# Patient Record
Sex: Male | Born: 1980 | Race: White | Hispanic: No | Marital: Married | Smoking: Never smoker
Health system: Southern US, Community
[De-identification: ages and names within clinical notes are randomized; demographics above are authoritative.]

## PROBLEM LIST (undated history)

## (undated) DIAGNOSIS — R7989 Other specified abnormal findings of blood chemistry: Secondary | ICD-10-CM

## (undated) DIAGNOSIS — K219 Gastro-esophageal reflux disease without esophagitis: Secondary | ICD-10-CM

## (undated) DIAGNOSIS — D869 Sarcoidosis, unspecified: Secondary | ICD-10-CM

## (undated) DIAGNOSIS — K2970 Gastritis, unspecified, without bleeding: Secondary | ICD-10-CM

## (undated) DIAGNOSIS — E669 Obesity, unspecified: Secondary | ICD-10-CM

## (undated) DIAGNOSIS — E559 Vitamin D deficiency, unspecified: Secondary | ICD-10-CM

## (undated) DIAGNOSIS — R059 Cough, unspecified: Secondary | ICD-10-CM

## (undated) DIAGNOSIS — E785 Hyperlipidemia, unspecified: Secondary | ICD-10-CM

## (undated) DIAGNOSIS — R0600 Dyspnea, unspecified: Secondary | ICD-10-CM

## (undated) DIAGNOSIS — K4090 Unilateral inguinal hernia, without obstruction or gangrene, not specified as recurrent: Secondary | ICD-10-CM

## (undated) DIAGNOSIS — E039 Hypothyroidism, unspecified: Secondary | ICD-10-CM

## (undated) DIAGNOSIS — R05 Cough: Secondary | ICD-10-CM

## (undated) DIAGNOSIS — E119 Type 2 diabetes mellitus without complications: Secondary | ICD-10-CM

## (undated) HISTORY — DX: Gastritis, unspecified, without bleeding: K29.70

## (undated) HISTORY — DX: Obesity, unspecified: E66.9

## (undated) HISTORY — DX: Type 2 diabetes mellitus without complications: E11.9

## (undated) HISTORY — DX: Other specified abnormal findings of blood chemistry: R79.89

## (undated) HISTORY — PX: WISDOM TOOTH EXTRACTION: SHX21

## (undated) HISTORY — DX: Hyperlipidemia, unspecified: E78.5

## (undated) HISTORY — DX: Vitamin D deficiency, unspecified: E55.9

## (undated) HISTORY — DX: Hypothyroidism, unspecified: E03.9

## (undated) HISTORY — PX: HERNIA REPAIR: SHX51

---
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
## 2006-05-11 ENCOUNTER — Ambulatory Visit: Payer: Self-pay | Admitting: Family Medicine

## 2006-05-25 ENCOUNTER — Ambulatory Visit: Payer: Self-pay | Admitting: Family Medicine

## 2007-02-08 ENCOUNTER — Ambulatory Visit: Payer: Self-pay | Admitting: Family Medicine

## 2011-07-20 ENCOUNTER — Encounter: Payer: Self-pay | Admitting: Family Medicine

## 2011-07-20 ENCOUNTER — Ambulatory Visit (INDEPENDENT_AMBULATORY_CARE_PROVIDER_SITE_OTHER): Payer: PRIVATE HEALTH INSURANCE | Admitting: Family Medicine

## 2011-07-20 VITALS — HR 97 | Ht 68.5 in | Wt 205.0 lb

## 2011-07-20 DIAGNOSIS — Z0000 Encounter for general adult medical examination without abnormal findings: Secondary | ICD-10-CM

## 2011-07-20 LAB — LIPID PANEL
Cholesterol: 340 mg/dL — ABNORMAL HIGH (ref 0–200)
HDL: 49 mg/dL (ref >39)
LDL Cholesterol: 264 mg/dL — ABNORMAL HIGH (ref 0–99)
Total CHOL/HDL Ratio: 6.9 Ratio
Triglycerides: 135 mg/dL (ref <150)
VLDL: 27 mg/dL (ref 0–40)

## 2011-07-20 LAB — COMPREHENSIVE METABOLIC PANEL
ALT: 18 U/L (ref 0–53)
AST: 12 U/L (ref 0–37)
Albumin: 4.8 g/dL (ref 3.5–5.2)
Alkaline Phosphatase: 86 U/L (ref 39–117)
BUN: 16 mg/dL (ref 6–23)
CO2: 25 mEq/L (ref 19–32)
Calcium: 9.9 mg/dL (ref 8.4–10.5)
Chloride: 94 mEq/L — ABNORMAL LOW (ref 96–112)
Creat: 1.25 mg/dL (ref 0.50–1.35)
Glucose, Bld: 435 mg/dL — ABNORMAL HIGH (ref 70–99)
Potassium: 3.8 mEq/L (ref 3.5–5.3)
Sodium: 131 mEq/L — ABNORMAL LOW (ref 135–145)
Total Bilirubin: 0.7 mg/dL (ref 0.3–1.2)
Total Protein: 7.9 g/dL (ref 6.0–8.3)

## 2011-07-20 NOTE — Progress Notes (Signed)
  Subjective:    Patient ID: Alejandro Griffin, male    DOB: 02-27-1981, 31 y.o.   MRN: [id number]  HPI He is here for a complete examination. He has no particular concerns or complaints. He is up-to-date on his immunizations. His marriage is going well. Family history significant for a grandparent with diabetes and mother with hypertension.   Review of Systems  Constitutional: Negative.   HENT: Negative.   Eyes: Negative.   Respiratory: Negative.   Cardiovascular: Negative.   Gastrointestinal: Negative.   Genitourinary: Negative.   Musculoskeletal: Negative.   Skin: Negative.   Neurological: Negative.   Hematological: Negative.   Psychiatric/Behavioral: Negative.        Objective:   Physical Exam Pulse 97  Ht 5' 8.5" (1.74 m)  Wt 205 lb (92.987 kg)  BMI 30.72 kg/m2  SpO2 98%  General Appearance:    Alert, cooperative, no distress, appears stated age  Head:    Normocephalic, without obvious abnormality, atraumatic  Eyes:    PERRL, conjunctiva/corneas clear, EOM's intact, fundi    benign  Ears:    Normal TM's and external ear canals  Nose:   Nares normal, mucosa normal, no drainage or sinus   tenderness  Throat:   Lips, mucosa, and tongue normal; teeth and gums normal  Neck:   Supple, no lymphadenopathy;  thyroid:  no   enlargement/tenderness/nodules; no carotid   bruit or JVD  Back:    Spine nontender, no curvature, ROM normal, no CVA     tenderness  Lungs:     Clear to auscultation bilaterally without wheezes, rales or     ronchi; respirations unlabored  Chest Wall:    No tenderness or deformity   Heart:    Regular rate and rhythm, S1 and S2 normal, no murmur, rub   or gallop  Breast Exam:    No chest wall tenderness, masses or gynecomastia  Abdomen:     Soft, non-tender, nondistended, normoactive bowel sounds,    no masses, no hepatosplenomegaly  Genitalia:   deferred   Rectal:   Deferred due to age <40 and lack of symptoms  Extremities:   No clubbing, cyanosis or  edema  Pulses:   2+ and symmetric all extremities  Skin:   Skin color, texture, turgor normal, no rashes or lesions  Lymph nodes:   Cervical, supraclavicular, and axillary nodes normal  Neurologic:   CNII-XII intact, normal strength, sensation and gait; reflexes 2+ and symmetric throughout          Psych:   Normal mood, affect, hygiene and grooming.           Assessment & Plan:

## 2011-07-22 ENCOUNTER — Ambulatory Visit: Payer: PRIVATE HEALTH INSURANCE | Admitting: Family Medicine

## 2012-01-21 ENCOUNTER — Ambulatory Visit (INDEPENDENT_AMBULATORY_CARE_PROVIDER_SITE_OTHER): Payer: Self-pay | Admitting: Family Medicine

## 2012-01-21 VITALS — BP 122/80 | HR 86 | Wt 175.0 lb

## 2012-01-21 DIAGNOSIS — E119 Type 2 diabetes mellitus without complications: Secondary | ICD-10-CM

## 2012-01-21 MED ORDER — METFORMIN HCL 500 MG PO TABS: 500.0000 mg | ORAL_TABLET | Freq: Two times a day (BID) | ORAL | Status: DC

## 2012-01-21 NOTE — Patient Instructions (Addendum)
Go to the American diabetes association website or [email or web address]. Keep track of your blood sugars. Call me Monday and get me the readings.

## 2012-01-21 NOTE — Progress Notes (Signed)
  Subjective:    Patient ID: Alejandro Griffin, male    DOB: 07/20/1980, 31 y.o.   MRN: [id number]  HPI He is here for a recheck. He was last seen in April. Blood work at that time showed an elevated blood sugar. Was recommended for him to come back in however he was out of town and did not make a followup appointment. He did make some dietary changes cutting back on his sodas. Approximately 2 months ago he noted the onset of polyuria, polydipsia.   Review of Systems     Objective:   Physical Exam Alert and in no distress. Blood sugar reading was attempted but unable to accomplish. Hemoglobin A1c is 14.       Assessment & Plan:   1. Diabetes mellitus, new onset  metFORMIN (GLUCOPHAGE) 500 MG tablet   he will also be started on Levemir 10 units. He is to keep track of his blood sugars twice per day before meals. Explained that I will also start him on metformin. Cautioned him about the possibility of diarrhea. He will call if any problems. He is to call Monday with his blood sugar readings. Discussed briefly diet, exercise, medications, blood pressure and cholesterol medications. We will start the education process.

## 2012-01-24 ENCOUNTER — Telehealth: Payer: Self-pay | Admitting: Internal Medicine

## 2012-01-24 NOTE — Telephone Encounter (Signed)
Alejandro Griffin sugars numbers over the weekend were: Saturday after lunch was an error, Saturday at dinner time- was 497. Sunday morning-373, Sunday night 452. Then this morning was an error

## 2012-01-24 NOTE — Telephone Encounter (Signed)
Have him increase the insulin to 14 units and call us in a couple of days

## 2012-01-24 NOTE — Telephone Encounter (Signed)
Called and talked with Alejandro Griffin told her to up 14 units call in a couple a days

## 2012-01-26 ENCOUNTER — Other Ambulatory Visit: Payer: Self-pay | Admitting: *Deleted

## 2012-01-26 DIAGNOSIS — E119 Type 2 diabetes mellitus without complications: Secondary | ICD-10-CM

## 2012-01-26 MED ORDER — GLUCOSE BLOOD VI STRP: ORAL_STRIP | Status: DC

## 2012-01-28 ENCOUNTER — Ambulatory Visit: Payer: Self-pay | Admitting: Family Medicine

## 2012-01-31 ENCOUNTER — Ambulatory Visit (INDEPENDENT_AMBULATORY_CARE_PROVIDER_SITE_OTHER): Payer: Self-pay | Admitting: Family Medicine

## 2012-01-31 ENCOUNTER — Encounter: Payer: Self-pay | Admitting: Family Medicine

## 2012-01-31 VITALS — BP 120/80 | Wt 180.0 lb

## 2012-01-31 DIAGNOSIS — E119 Type 2 diabetes mellitus without complications: Secondary | ICD-10-CM

## 2012-01-31 NOTE — Progress Notes (Signed)
  Subjective:    Patient ID: Alejandro Griffin, male    DOB: 05-22-1980, 31 y.o.   MRN: [id number]  HPI He is here for a recheck. He is having nocturia usually only once per night. His blood sugars are running in the 2-400 range. Presently he is on 14 units of Lantus. He is taking Glucophage twice per day.   Review of Systems     Objective:   Physical Exam Alert and in no distress otherwise not examined      Assessment & Plan:   1. Diabetes mellitus, new onset    his Levemir to 18 units. He is to call me on Thursday and let me know what the number is. Discussed getting his blood sugars down in the 100 range.

## 2012-01-31 NOTE — Patient Instructions (Signed)
Go up to 18 units of Levemir per day. Keep checking your blood sugars and when the morning number are in the low 100s we will keep you at that dosing and if it doesn't come down we will go higher

## 2012-02-08 ENCOUNTER — Encounter: Payer: Self-pay | Admitting: Internal Medicine

## 2012-02-08 LAB — HM DIABETES EYE EXAM

## 2012-02-15 ENCOUNTER — Other Ambulatory Visit: Payer: Self-pay | Admitting: Family Medicine

## 2012-02-15 MED ORDER — INSULIN PEN NEEDLE 29G X 8MM MISC: Status: DC

## 2012-02-15 NOTE — Telephone Encounter (Signed)
SENT PEN NEEDLES IN

## 2012-02-17 ENCOUNTER — Telehealth: Payer: Self-pay | Admitting: Family Medicine

## 2012-02-17 NOTE — Telephone Encounter (Signed)
PT said he got the needles we called in, but he also needed is insulin refilled   [facility] [address]

## 2012-02-17 NOTE — Telephone Encounter (Signed)
Call this in 

## 2012-02-18 ENCOUNTER — Other Ambulatory Visit: Payer: Self-pay

## 2012-02-18 MED ORDER — INSULIN DETEMIR 100 UNIT/ML ~~LOC~~ SOLN: 10.0000 [IU] | Freq: Every day | SUBCUTANEOUS | Status: DC

## 2012-02-18 NOTE — Telephone Encounter (Signed)
Insulin sent in 

## 2012-03-02 ENCOUNTER — Ambulatory Visit (INDEPENDENT_AMBULATORY_CARE_PROVIDER_SITE_OTHER): Payer: Self-pay | Admitting: Family Medicine

## 2012-03-02 ENCOUNTER — Encounter: Payer: Self-pay | Admitting: Family Medicine

## 2012-03-02 VITALS — BP 116/78 | HR 64 | Wt 190.0 lb

## 2012-03-02 DIAGNOSIS — E119 Type 2 diabetes mellitus without complications: Secondary | ICD-10-CM

## 2012-03-02 NOTE — Progress Notes (Signed)
  Subjective:    Patient ID: Alejandro Griffin, male    DOB: 05/07/1980, 31 y.o.   MRN: [id number]  HPI He is here for recheck. He is now on 18 units of Levemir as well as taking 500 twice a day of metformin. His blood sugars vary from the mid 100s to the mid 200 range roughly. He has no particular concerns or complaints. He does have some Lantus that he acquired from his grandmother. He will have insurance the beginning of the year.  Review of Systems     Objective:   Physical Exam Joneen Boers and in no distress otherwise not examined      Assessment & Plan:   1. Diabetes mellitus, new onset    I will increase his metformin to 1000 mg twice a day and add Actos. He will check on the cost of this versus Actoplus.Marland Kitchen He will let me know which one cost the least and I will call it in.

## 2012-03-07 ENCOUNTER — Other Ambulatory Visit: Payer: Self-pay

## 2012-03-07 MED ORDER — PIOGLITAZONE HCL-METFORMIN HCL 15-850 MG PO TABS: 1.0000 | ORAL_TABLET | Freq: Two times a day (BID) | ORAL | Status: DC

## 2012-03-07 NOTE — Telephone Encounter (Signed)
Sent in actos plus met 15/850 bid

## 2012-03-14 ENCOUNTER — Telehealth: Payer: Self-pay | Admitting: Family Medicine

## 2012-03-14 NOTE — Telephone Encounter (Signed)
PT IS TO BE ON ACTOS PLUS MET BUT HE IS TAKING 500 MG BID METFORMIN SAID YOU WANTED TO CHANGE HIM TO ACTOS PLUS MET BUT HE CA NOT AFFORD IT OVER 200 A MONTH

## 2012-03-14 NOTE — Telephone Encounter (Signed)
I think the record indicates he is on Actos plus. Let me know that there really is not a good substitute for that

## 2012-03-14 NOTE — Telephone Encounter (Signed)
Put him on 1000 twice a day of metformin

## 2012-03-15 ENCOUNTER — Other Ambulatory Visit: Payer: Self-pay

## 2012-03-15 MED ORDER — METFORMIN HCL 1000 MG PO TABS: 1000.0000 mg | ORAL_TABLET | Freq: Two times a day (BID) | ORAL | Status: DC

## 2012-03-15 NOTE — Telephone Encounter (Signed)
Sent med in pt informed

## 2012-03-31 ENCOUNTER — Ambulatory Visit: Payer: Self-pay | Admitting: Medical

## 2012-04-25 ENCOUNTER — Ambulatory Visit (INDEPENDENT_AMBULATORY_CARE_PROVIDER_SITE_OTHER): Payer: BC Managed Care – PPO | Admitting: Family Medicine

## 2012-04-25 ENCOUNTER — Encounter: Payer: Self-pay | Admitting: Family Medicine

## 2012-04-25 VITALS — BP 120/82 | HR 91 | Wt 200.0 lb

## 2012-04-25 DIAGNOSIS — E119 Type 2 diabetes mellitus without complications: Secondary | ICD-10-CM

## 2012-04-25 LAB — COMPREHENSIVE METABOLIC PANEL
ALT: 17 U/L (ref 0–53)
AST: 17 U/L (ref 0–37)
Albumin: 4.6 g/dL (ref 3.5–5.2)
Alkaline Phosphatase: 52 U/L (ref 39–117)
BUN: 22 mg/dL (ref 6–23)
CO2: 26 mEq/L (ref 19–32)
Calcium: 9.5 mg/dL (ref 8.4–10.5)
Chloride: 103 mEq/L (ref 96–112)
Creat: 1.06 mg/dL (ref 0.50–1.35)
Glucose, Bld: 105 mg/dL — ABNORMAL HIGH (ref 70–99)
Potassium: 3.9 mEq/L (ref 3.5–5.3)
Sodium: 138 mEq/L (ref 135–145)
Total Bilirubin: 0.3 mg/dL (ref 0.3–1.2)
Total Protein: 6.9 g/dL (ref 6.0–8.3)

## 2012-04-25 LAB — CBC WITH DIFFERENTIAL/PLATELET
Basophils Absolute: 0 10*3/uL (ref 0.0–0.1)
Basophils Relative: 0 % (ref 0–1)
Eosinophils Absolute: 0.2 10*3/uL (ref 0.0–0.7)
Eosinophils Relative: 3 % (ref 0–5)
HCT: 41.6 % (ref 39.0–52.0)
Hemoglobin: 14 g/dL (ref 13.0–17.0)
Lymphocytes Relative: 35 % (ref 12–46)
Lymphs Abs: 2.9 10*3/uL (ref 0.7–4.0)
MCH: 29.5 pg (ref 26.0–34.0)
MCHC: 33.7 g/dL (ref 30.0–36.0)
MCV: 87.6 fL (ref 78.0–100.0)
Monocytes Absolute: 0.6 10*3/uL (ref 0.1–1.0)
Monocytes Relative: 7 % (ref 3–12)
Neutro Abs: 4.5 10*3/uL (ref 1.7–7.7)
Neutrophils Relative %: 55 % (ref 43–77)
Platelets: 282 10*3/uL (ref 150–400)
RBC: 4.75 MIL/uL (ref 4.22–5.81)
RDW: 13.4 % (ref 11.5–15.5)
WBC: 8.2 10*3/uL (ref 4.0–10.5)

## 2012-04-25 LAB — LIPID PANEL
Cholesterol: 240 mg/dL — ABNORMAL HIGH (ref 0–200)
HDL: 46 mg/dL (ref >39)
LDL Cholesterol: 156 mg/dL — ABNORMAL HIGH (ref 0–99)
Total CHOL/HDL Ratio: 5.2 Ratio
Triglycerides: 188 mg/dL — ABNORMAL HIGH (ref <150)
VLDL: 38 mg/dL (ref 0–40)

## 2012-04-25 LAB — POCT GLYCOSYLATED HEMOGLOBIN (HGB A1C): Hemoglobin A1C: 8.3

## 2012-04-25 MED ORDER — PIOGLITAZONE HCL-METFORMIN HCL 15-850 MG PO TABS: 1.0000 | ORAL_TABLET | Freq: Two times a day (BID) | ORAL | Status: DC

## 2012-04-25 NOTE — Progress Notes (Signed)
  Subjective:    Patient ID: Alejandro Griffin, male    DOB: 08/25/1980, 32 y.o.   MRN: [id number]  HPI He is here for recheck. He is on 18 units of Levemir as well as 1000 mg twice a day of metformin. He states his blood sugars are running between 80 and 120 and have been so since December 1. He now is on insurance. He has not been through diabetes education.  Review of Systems     Objective:   Physical Exam Alert and in no distress. Hemoglobin A1c is 8.3. Review his record indicates greatly elevated lipid panel from the past.       Assessment & Plan:   1. Diabetes mellitus, new onset  Ambulatory referral to diabetic education, Lipid panel, CBC with Differential, Comprehensive metabolic panel, HgB A1c, pioglitazone-metformin (ACTOPLUS MET) 15-850 MG per tablet   Explained that I will place him on a statin but will wait till his blood work comes back. We will attempt to get him off of his insulin. Also explained that he will eventually be put on an ACE inhibitor to help with kidney preservation. Strongly encouraged him to go to the dietary [place].

## 2012-04-26 ENCOUNTER — Other Ambulatory Visit: Payer: Self-pay

## 2012-04-26 MED ORDER — ATORVASTATIN CALCIUM 20 MG PO TABS: 20.0000 mg | ORAL_TABLET | Freq: Every day | ORAL | Status: DC

## 2012-04-26 NOTE — Telephone Encounter (Signed)
Sent lipitor in

## 2012-04-26 NOTE — Progress Notes (Signed)
Quick Note:  Called pt left message chol. Looks better than 9 mth ago but JCL has placed you on lipitor 20 mg 1 qd please make apt to follow up in 4 mth ______

## 2012-04-26 NOTE — Progress Notes (Signed)
Quick Note:  Let him know that his cholesterol numbers are better than or 9 months ago but still need to be improved upon. Place him on 20 mg of Lipitor. ______

## 2012-05-02 ENCOUNTER — Encounter: Payer: BC Managed Care – PPO | Attending: Family Medicine | Admitting: *Deleted

## 2012-05-02 VITALS — Ht 67.5 in | Wt 199.2 lb

## 2012-05-02 DIAGNOSIS — E119 Type 2 diabetes mellitus without complications: Secondary | ICD-10-CM | POA: Insufficient documentation

## 2012-05-02 DIAGNOSIS — Z713 Dietary counseling and surveillance: Secondary | ICD-10-CM | POA: Insufficient documentation

## 2012-05-04 ENCOUNTER — Other Ambulatory Visit: Payer: Self-pay

## 2012-05-04 DIAGNOSIS — E119 Type 2 diabetes mellitus without complications: Secondary | ICD-10-CM

## 2012-05-04 MED ORDER — ONETOUCH ULTRASOFT LANCETS MISC
Status: DC
Start: 2012-05-04 — End: 2015-02-07

## 2012-05-04 MED ORDER — ONETOUCH ULTRA SYSTEM W/DEVICE KIT: 1.0000 | PACK | Freq: Once | Status: DC

## 2012-05-04 MED ORDER — GLUCOSE BLOOD VI STRP: ORAL_STRIP | Status: DC

## 2012-05-06 ENCOUNTER — Encounter: Payer: Self-pay | Admitting: *Deleted

## 2012-05-06 NOTE — Progress Notes (Signed)
  Patient was seen on 05/02/2012 for the first of a series of three diabetes self-management courses at the [facility]. A1c on 04/25/12 = 8.3% The following learning objectives were met by the patient during this course:   Defines the role of glucose and insulin  Identifies type of diabetes and pathophysiology  Defines the diagnostic criteria for diabetes and prediabetes  States the risk factors for Type 2 Diabetes  States the symptoms of Type 2 Diabetes  Defines Type 2 Diabetes treatment goals  Defines Type 2 Diabetes treatment options  States the rationale for glucose monitoring  Identifies A1C, glucose targets, and testing times  Identifies proper sharps disposal  Defines the purpose of a diabetes food plan  Identifies carbohydrate food groups  Defines effects of carbohydrate foods on glucose levels  Identifies carbohydrate choices/grams/food labels  States benefits of physical activity and effect on glucose  Review of suggested activity guidelines  Handouts given during class include:  Type 2 Diabetes: Basics Book  My Food Plan Book  Food and Activity Log  Follow-Up Plan: Core 2 Class

## 2012-05-06 NOTE — Patient Instructions (Signed)
Goals:  Follow Diabetes Meal Plan as instructed  Eat 3 meals and 2 snacks, every 3-5 hrs  Limit carbohydrate intake to 60 grams carbohydrate/meal  Limit carbohydrate intake to 30 grams carbohydrate/snack  Add lean protein foods to meals/snacks  Monitor glucose levels as instructed by your doctor  Aim for 30 mins of physical activity daily  Bring food record and glucose log to your next nutrition visit   

## 2012-05-09 ENCOUNTER — Encounter: Payer: BC Managed Care – PPO | Admitting: *Deleted

## 2012-05-09 DIAGNOSIS — E119 Type 2 diabetes mellitus without complications: Secondary | ICD-10-CM

## 2012-05-10 NOTE — Progress Notes (Signed)
  Patient was seen on 05/09/12 for the second of a series of three diabetes self-management courses at the [facility]. The following learning objectives were met by the patient during this course:   Explain basic nutrition maintenance and quality assurance  Describe causes, symptoms and treatment of hypoglycemia and hyperglycemia  Explain how to manage diabetes during illness  Describe the importance of good nutrition for health and healthy eating strategies  List strategies to follow meal plan when dining out  Describe the effects of alcohol on glucose and how to use it safely  Describe problem solving skills for day-to-day glucose challenges  Describe strategies to use when treatment plan needs to change  Identify important factors involved in successful weight loss  Describe ways to remain physically active  Describe the impact of regular activity on insulin resistance    Handouts given in class:  Refrigerator magnet for Sick Day Guidelines  [facility] Oral medication/insulin handout  Follow-Up Plan: Patient will attend the final class of the ADA Diabetes Self-Care Education.   

## 2012-05-10 NOTE — Patient Instructions (Signed)
Goals:  Follow Diabetes Meal Plan as instructed  Eat 3 meals and 2 snacks, every 3-5 hrs  Limit carbohydrate intake to 45-60 grams carbohydrate/meal  Limit carbohydrate intake to 30 grams carbohydrate/snack  Add lean protein foods to meals/snacks  Monitor glucose levels as instructed by your doctor  Aim for 30 mins of physical activity daily  Bring food record and glucose log to your next nutrition visit   

## 2012-05-23 ENCOUNTER — Encounter: Payer: BC Managed Care – PPO | Attending: Family Medicine | Admitting: Dietician

## 2012-05-23 DIAGNOSIS — Z713 Dietary counseling and surveillance: Secondary | ICD-10-CM | POA: Insufficient documentation

## 2012-05-23 DIAGNOSIS — E119 Type 2 diabetes mellitus without complications: Secondary | ICD-10-CM

## 2012-05-24 NOTE — Progress Notes (Signed)
  Patient was seen on 05/23/2012 for the third of a series of three diabetes self-management courses at the [facility]. The following learning objectives were met by the patient during this course:    Describe how diabetes changes over time   Identify diabetes complications and ways to prevent them   Describe strategies that can promote heart health including lowering blood pressure and cholesterol   Describe strategies to lower dietary fat and sodium in the diet   Identify physical activities that benefit cardiovascular health   Evaluate success in meeting personal goal   Describe the belief that they can live successfully with diabetes day to day   Establish 2-3 goals that they will plan to diligently work on until they return for the free 57-month follow-up visit  The following handouts were given in class:  3 Month Follow Up Visit handout  Goal setting handout  Class evaluation form  Summary:  HT: 67.5 in  WT: 199.2 lb (05/02/2012)  A1C: 8.3%  (04/19/2012)  Your patient has established the following 3 month goals for diabetes self-care:  Not worry about work.  Follow-Up Plan: Patient will attend a 3 month follow-up visit for diabetes self-management education.

## 2012-06-22 ENCOUNTER — Telehealth: Payer: Self-pay | Admitting: Family Medicine

## 2012-06-22 NOTE — Telephone Encounter (Signed)
Explain that it is probably his eating habits need to change not the medicine

## 2012-06-22 NOTE — Telephone Encounter (Signed)
Pt states he eats samething  But he still thinks that lantus works better verses levamire please advise

## 2012-06-22 NOTE — Telephone Encounter (Signed)
Switch him to Lantus.

## 2012-06-26 MED ORDER — INSULIN GLARGINE 100 UNIT/ML ~~LOC~~ SOLN: 18.0000 [IU] | Freq: Every day | SUBCUTANEOUS | Status: DC

## 2012-06-26 NOTE — Telephone Encounter (Signed)
How many units with the lantus?

## 2012-06-26 NOTE — Telephone Encounter (Signed)
Same dosing as with Levemir

## 2012-06-26 NOTE — Telephone Encounter (Signed)
done

## 2012-07-28 ENCOUNTER — Telehealth: Payer: Self-pay | Admitting: Family Medicine

## 2012-07-28 MED ORDER — ATORVASTATIN CALCIUM 20 MG PO TABS: 20.0000 mg | ORAL_TABLET | Freq: Every day | ORAL | Status: DC

## 2012-07-28 NOTE — Telephone Encounter (Signed)
done

## 2012-08-16 ENCOUNTER — Telehealth: Payer: Self-pay | Admitting: Internal Medicine

## 2012-08-16 DIAGNOSIS — E119 Type 2 diabetes mellitus without complications: Secondary | ICD-10-CM

## 2012-08-16 MED ORDER — GLUCOSE BLOOD VI STRP: ORAL_STRIP | Status: DC

## 2012-08-16 NOTE — Telephone Encounter (Signed)
Must be a 90 day supply one touch ultra test strips

## 2012-08-21 ENCOUNTER — Encounter: Payer: BC Managed Care – PPO | Attending: Family Medicine | Admitting: *Deleted

## 2012-08-21 ENCOUNTER — Encounter: Payer: Self-pay | Admitting: *Deleted

## 2012-08-21 DIAGNOSIS — Z713 Dietary counseling and surveillance: Secondary | ICD-10-CM | POA: Insufficient documentation

## 2012-08-21 DIAGNOSIS — E119 Type 2 diabetes mellitus without complications: Secondary | ICD-10-CM | POA: Insufficient documentation

## 2012-08-21 NOTE — Progress Notes (Signed)
  Patient was seen on 08/21/12 for their 3 month follow-up as a part of the diabetes self-management courses at the [facility]. The following learning objectives were met by your patient during this course:  Patient self reports the following:  Diabetes control has improved since diabetes self-management training: GBS has decreased mostly due to medication Number of days blood glucose is >200: none Last MD appointment for diabetes: goes next week Changes in treatment plan: none.  Has been prescribed Lantus, but continues Levemir Confidence with ability to manage diabetes: feels better Areas for improvement with diabetes self-care: timing of insulin injections, stopping soda consumption, dietary improvements Willingness to participate in diabetes support group: none   Patient states it's hard to eat when at work and has to skip meals sometimes.  The family eats out a lot and he's drinking a lot of soda.  He agrees to cut back on fatty foods and sodas  Please see Diabetes Flow sheet for findings related to patient's self-care.  Follow-Up Plan: Patient is eligible for a "free" 30 minute diabetes self-care appointment in the next year. Patient to call and schedule as needed.

## 2012-08-21 NOTE — Patient Instructions (Signed)
Get moving on your days off Do 1/2 and 1/2 sodas instead of regular Make healthier food choices when eating out

## 2012-08-24 ENCOUNTER — Ambulatory Visit (INDEPENDENT_AMBULATORY_CARE_PROVIDER_SITE_OTHER): Payer: BC Managed Care – PPO | Admitting: Family Medicine

## 2012-08-24 ENCOUNTER — Encounter: Payer: Self-pay | Admitting: Family Medicine

## 2012-08-24 VITALS — BP 126/80 | HR 90 | Wt 214.0 lb

## 2012-08-24 DIAGNOSIS — E785 Hyperlipidemia, unspecified: Secondary | ICD-10-CM

## 2012-08-24 DIAGNOSIS — E119 Type 2 diabetes mellitus without complications: Secondary | ICD-10-CM

## 2012-08-24 LAB — POCT GLYCOSYLATED HEMOGLOBIN (HGB A1C): Hemoglobin A1C: 6.9

## 2012-08-24 NOTE — Patient Instructions (Signed)
Cut back on the insulin by 2 units every 3 or 4 days. Keep monitoring your morning blood sugar. As long as your morning sugars stays under 150 does keep cutting back. Once he gets 10 and there's been no change then stop. If there is any question call me

## 2012-08-24 NOTE — Progress Notes (Signed)
Subjective:    Alejandro Griffin is a 32 y.o. male who presents for follow-up of Type 2 diabetes mellitus.    Home blood sugar records: 181 high 80 low  Current symptoms/problems include none Daily foot checks, foot concerns: yes Last eye exam:  Nov.2013   Medication compliance: Current diet: low carb salads  Current exercise: baseball in spring walk all the time at work  Known diabetic complications: none Cardiovascular risk factors: diabetes mellitus and male gender   The following portions of the patient's history were reviewed and updated as appropriate: allergies, current medications, past family history, past medical history, past social history, past surgical history and problem list.  ROS as in subjective above    Objective:    BP 126/80  Pulse 90  Wt 214 lb (97.07 kg)  BMI 33 kg/m2  Filed Vitals:   08/24/12 1557  BP: 126/80  Pulse: 90    General appearence: alert, no distress, WD/WN Neck: supple, no lymphadenopathy, no thyromegaly, no masses Heart: RRR, normal S1, S2, no murmurs Lungs: CTA bilaterally, no wheezes, rhonchi, or rales Abdomen: +bs, soft, non tender, non distended, no masses, no hepatomegaly, no splenomegaly Pulses: 2+ symmetric, upper and lower extremities, normal cap refill Ext: no edema Foot exam:  Neuro: foot monofilament exam normal   Lab Review Lab Results  Component Value Date   HGBA1C 8.3 04/25/2012   Lab Results  Component Value Date   CHOL 240* 04/25/2012   HDL 46 04/25/2012   LDLCALC 401* 04/25/2012   TRIG 188* 04/25/2012   CHOLHDL 5.2 04/25/2012   No results found for this basenameConcepcion Elk     Chemistry      Component Value Date/Time   NA 138 04/25/2012 1633   K 3.9 04/25/2012 1633   CL 103 04/25/2012 1633   CO2 26 04/25/2012 1633   BUN 22 04/25/2012 1633   CREATININE 1.06 04/25/2012 1633      Component Value Date/Time   CALCIUM 9.5 04/25/2012 1633   ALKPHOS 52 04/25/2012 1633   AST 17 04/25/2012 1633   ALT 17 04/25/2012 1633    BILITOT 0.3 04/25/2012 1633        Chemistry      Component Value Date/Time   NA 138 04/25/2012 1633   K 3.9 04/25/2012 1633   CL 103 04/25/2012 1633   CO2 26 04/25/2012 1633   BUN 22 04/25/2012 1633   CREATININE 1.06 04/25/2012 1633      Component Value Date/Time   CALCIUM 9.5 04/25/2012 1633   ALKPHOS 52 04/25/2012 1633   AST 17 04/25/2012 1633   ALT 17 04/25/2012 1633   BILITOT 0.3 04/25/2012 1633       Last optometry/ophthalmology exam reviewed from:    Assessment:   Hemoglobin A1c 6.9     Plan:    1.  Rx changes: I will see if we can taper him off the insulin. Instructed him to back on his insulin dosing and check his blood sugars. As long as he stays below 150 he can continue until he is down to 10 units and then stop 2.  Education: Reviewed 'ABCs' of diabetes management (respective goals in parentheses):  A1C (<7), blood pressure (<130/80), and cholesterol (LDL <100). 3.  Compliance at present is estimated to be excellent. Efforts to improve compliance (if necessary) will be directed at No changes. 4. Follow up: 4 months  He is very happy as am I with his A1c result.hopefully we can get him off of  his insulin and help with his weight. I have a feeling that the weight is insulin related.

## 2012-08-25 ENCOUNTER — Telehealth: Payer: Self-pay | Admitting: Internal Medicine

## 2012-08-25 NOTE — Telephone Encounter (Signed)
Pt is scheduled with ENT  Dr. Narda Bonds 5/15 @ 12:30pm  Address [address] Phone # [phone]

## 2012-11-08 ENCOUNTER — Telehealth: Payer: Self-pay | Admitting: Internal Medicine

## 2012-11-08 MED ORDER — ATORVASTATIN CALCIUM 20 MG PO TABS: 20.0000 mg | ORAL_TABLET | Freq: Every day | ORAL | Status: DC

## 2012-11-08 NOTE — Telephone Encounter (Signed)
Pt needs a refill on lipitor #90 supply to [facility]

## 2012-11-08 NOTE — Telephone Encounter (Signed)
SENT LIPITOR IN 

## 2012-11-21 ENCOUNTER — Encounter: Payer: BC Managed Care – PPO | Attending: Family Medicine | Admitting: *Deleted

## 2012-11-21 VITALS — Ht 67.5 in | Wt 214.5 lb

## 2012-11-21 DIAGNOSIS — Z713 Dietary counseling and surveillance: Secondary | ICD-10-CM | POA: Insufficient documentation

## 2012-11-21 DIAGNOSIS — E119 Type 2 diabetes mellitus without complications: Secondary | ICD-10-CM | POA: Insufficient documentation

## 2012-11-21 NOTE — Progress Notes (Signed)
  Patient was seen on 11/21/12 for their 6 month follow-up as a part of the diabetes self-management courses at the [facility]. The following learning objectives were met by your patient during this course:  Patient self reports the following:  Diabetes control has improved since diabetes self-management training: GBS has decreased mostly due to medication.  Patient has made no lifestyle changes Number of days blood glucose is >200: none that he knows of; he checks fasting glucose and never pp glucose Last MD appointment for diabetes: goes next week Changes in treatment plan: none.  Has been prescribed Lantus, and is decreasing his units Confidence with ability to manage diabetes: feels better Areas for improvement with diabetes self-care: increasing vegetable consumption and increasing physical activity.  Reomended 7 minute workout Willingness to participate in diabetes support group: none   Patient to call and schedule follow up as needed.

## 2012-12-05 ENCOUNTER — Ambulatory Visit (INDEPENDENT_AMBULATORY_CARE_PROVIDER_SITE_OTHER): Payer: BC Managed Care – PPO | Admitting: Family Medicine

## 2012-12-05 ENCOUNTER — Other Ambulatory Visit: Payer: Self-pay | Admitting: Family Medicine

## 2012-12-05 ENCOUNTER — Encounter: Payer: Self-pay | Admitting: Family Medicine

## 2012-12-05 ENCOUNTER — Telehealth: Payer: Self-pay | Admitting: Internal Medicine

## 2012-12-05 VITALS — BP 118/80 | HR 90 | Wt 215.0 lb

## 2012-12-05 DIAGNOSIS — B354 Tinea corporis: Secondary | ICD-10-CM

## 2012-12-05 MED ORDER — TERBINAFINE HCL 250 MG PO TABS: 250.0000 mg | ORAL_TABLET | Freq: Every day | ORAL | Status: DC

## 2012-12-05 NOTE — Patient Instructions (Signed)
Let me see you at the end of September

## 2012-12-05 NOTE — Progress Notes (Signed)
  Subjective:    Patient ID: Alejandro Griffin, male    DOB: 1981-02-20, 32 y.o.   MRN: [id number]  HPI He is here for evaluation of a rash present on his forehead. He states that he was here several months ago but much less. He was treated for tinea Manus and pedis few years ago. He has been trying to use a topical preparation on his face but he wears a hat at work which makes is difficult. He has not been able to use a medicine on a regular basis.   Review of Systems     Objective:   Physical Exam Exam of his face shows multiple circular lesions with erythematous margin and clear central area mainly on his forehead. KOH was positive.       Assessment & Plan:  Tinea circinata - Plan: terbinafine (LAMISIL) 250 MG tablet  I will treat him for one month and then recheck. Discussed the fact that we cannot cure this but control it. Recommended eventually using a topical medication intermittently to keep it under control.

## 2012-12-05 NOTE — Telephone Encounter (Signed)
Pt needs a refill on actosplus metformin to [facility] [employer]

## 2012-12-07 ENCOUNTER — Other Ambulatory Visit: Payer: Self-pay

## 2012-12-07 MED ORDER — PIOGLITAZONE HCL-METFORMIN HCL 15-850 MG PO TABS: ORAL_TABLET | ORAL | Status: DC

## 2012-12-07 NOTE — Telephone Encounter (Signed)
Done

## 2012-12-07 NOTE — Telephone Encounter (Signed)
SENT IN DIABETES MED

## 2012-12-08 ENCOUNTER — Telehealth: Payer: Self-pay | Admitting: Family Medicine

## 2012-12-14 NOTE — Telephone Encounter (Signed)
LM

## 2012-12-28 ENCOUNTER — Ambulatory Visit: Payer: BC Managed Care – PPO | Admitting: Family Medicine

## 2013-01-15 ENCOUNTER — Ambulatory Visit: Payer: BC Managed Care – PPO | Admitting: Family Medicine

## 2013-01-18 ENCOUNTER — Ambulatory Visit: Payer: BC Managed Care – PPO | Admitting: Family Medicine

## 2013-01-24 ENCOUNTER — Ambulatory Visit (INDEPENDENT_AMBULATORY_CARE_PROVIDER_SITE_OTHER): Payer: BC Managed Care – PPO | Admitting: Family Medicine

## 2013-01-24 ENCOUNTER — Encounter: Payer: Self-pay | Admitting: Family Medicine

## 2013-01-24 VITALS — BP 114/70 | HR 88 | Wt 215.0 lb

## 2013-01-24 DIAGNOSIS — E669 Obesity, unspecified: Secondary | ICD-10-CM

## 2013-01-24 DIAGNOSIS — E785 Hyperlipidemia, unspecified: Secondary | ICD-10-CM

## 2013-01-24 DIAGNOSIS — E119 Type 2 diabetes mellitus without complications: Secondary | ICD-10-CM

## 2013-01-24 DIAGNOSIS — Z23 Encounter for immunization: Secondary | ICD-10-CM

## 2013-01-24 LAB — POCT GLYCOSYLATED HEMOGLOBIN (HGB A1C): Hemoglobin A1C: 8.2

## 2013-01-24 NOTE — Progress Notes (Signed)
Subjective:    Alejandro Griffin is a 32 y.o. male who presents for follow-up of Type 2 diabetes mellitus.    Home blood sugar records: NO. He states that he has not been able to check his blood sugars because of his work schedule. He has however been able to wean his insulin down to 10 units. He states his blood sugars in the morning when he does check them are in the 120 range.  Current symptoms/problems  Daily foot checks:  Any foot concerns: NO Last eye exam:  2013   Medication compliance: Fair Current diet: NO Current exercise: WORKING  Known diabetic complications: none Cardiovascular risk factors: diabetes mellitus, dyslipidemia, male gender and obesity (BMI >= 30 kg/m2)   The following portions of the patient's history were reviewed and updated as appropriate: allergies, current medications, past medical history, past social history and problem list.  ROS as in subjective above    Objective:    BP 114/70  Pulse 88  Wt 215 lb (97.523 kg)  BMI 33.16 kg/m2  Filed Vitals:   01/24/13 1617  BP: 114/70  Pulse: 88    General appearence: alert, no distress, WD/WN Neck: supple, no lymphadenopathy, no thyromegaly, no masses Heart: RRR, normal S1, S2, no murmurs Lungs: CTA bilaterally, no wheezes, rhonchi, or rales Abdomen: +bs, soft, non tender, non distended, no masses, no hepatomegaly, no splenomegaly Pulses: 2+ symmetric, upper and lower extremities, normal cap refill Ext: no edema Foot exam:  Neuro: foot monofilament exam normal   Lab Review Lab Results  Component Value Date   HGBA1C 6.9 08/24/2012   Lab Results  Component Value Date   CHOL 240* 04/25/2012   HDL 46 04/25/2012   LDLCALC 161* 04/25/2012   TRIG 188* 04/25/2012   CHOLHDL 5.2 04/25/2012   No results found for this basenameConcepcion Elk     Chemistry      Component Value Date/Time   NA 138 04/25/2012 1633   K 3.9 04/25/2012 1633   CL 103 04/25/2012 1633   CO2 26 04/25/2012 1633   BUN 22 04/25/2012  1633   CREATININE 1.06 04/25/2012 1633      Component Value Date/Time   CALCIUM 9.5 04/25/2012 1633   ALKPHOS 52 04/25/2012 1633   AST 17 04/25/2012 1633   ALT 17 04/25/2012 1633   BILITOT 0.3 04/25/2012 1633        Chemistry      Component Value Date/Time   NA 138 04/25/2012 1633   K 3.9 04/25/2012 1633   CL 103 04/25/2012 1633   CO2 26 04/25/2012 1633   BUN 22 04/25/2012 1633   CREATININE 1.06 04/25/2012 1633      Component Value Date/Time   CALCIUM 9.5 04/25/2012 1633   ALKPHOS 52 04/25/2012 1633   AST 17 04/25/2012 1633   ALT 17 04/25/2012 1633   BILITOT 0.3 04/25/2012 1633       Last optometry/ophthalmology exam reviewed from:    Assessment:   Encounter Diagnoses  Name Primary?  . Type II or unspecified type diabetes mellitus without mention of complication, not stated as uncontrolled Yes  . Need for prophylactic vaccination and inoculation against influenza   . Obesity (BMI 30-39.9)   . Hyperlipidemia LDL goal < 70          Plan:    1.  Rx changes: He is to stop his insulin but start checking his sugars more regularly. Instructed that they go above 200 he is to let me know.  Recheck here in one month. 2.  Education: Reviewed 'ABCs' of diabetes management (respective goals in parentheses):  A1C (<7), blood pressure (<130/80), and cholesterol (LDL <100). 3.  Compliance at present is estimated to be poor. Efforts to improve compliance (if necessary) will be directed at regular blood sugar monitoring: daily. 4. Follow up: 1 month  I did have a long discussion with him concerning his diabetes and the fact that he needs to take a more active approach in this. He needs to treat this like it is 24/7/365

## 2013-01-24 NOTE — Patient Instructions (Signed)
Stop the Lantus. Check your sugars twice per day to a few of them before meals and somewhat him to our after meals let's see where the dust settles

## 2013-02-27 ENCOUNTER — Ambulatory Visit: Payer: BC Managed Care – PPO | Admitting: Family Medicine

## 2013-02-27 ENCOUNTER — Ambulatory Visit (INDEPENDENT_AMBULATORY_CARE_PROVIDER_SITE_OTHER): Payer: BC Managed Care – PPO | Admitting: Family Medicine

## 2013-02-27 ENCOUNTER — Encounter: Payer: Self-pay | Admitting: Family Medicine

## 2013-02-27 VITALS — BP 130/80 | HR 87 | Wt 214.0 lb

## 2013-02-27 DIAGNOSIS — E669 Obesity, unspecified: Secondary | ICD-10-CM

## 2013-02-27 DIAGNOSIS — E119 Type 2 diabetes mellitus without complications: Secondary | ICD-10-CM

## 2013-02-27 NOTE — Patient Instructions (Signed)
Start with 10 units and go up 2 units every 2 days until your blood sugar is under 120. We will leave you there for the time being

## 2013-02-27 NOTE — Progress Notes (Signed)
  Subjective:    Patient ID: Alejandro Griffin, male    DOB: 10-05-1980, 32 y.o.   MRN: [id number]  HPI He is here for a recheck. He stopped taking his insulin after his last visit. He was down to 10 units. He has noted that the blood sugars have been slowly rising and are now in the mid 2 range. Today's reading is 355.   Review of Systems     Objective:   Physical Exam Alert and in no distress otherwise not examined       Assessment & Plan:  Type II or unspecified type diabetes mellitus without mention of complication, not stated as uncontrolled  Obesity (BMI 30-39.9)  it doesn't seem that he is able to come off his insulin. I doubt that an oral medication can make up the difference. He is to start back on insulin starting again at 10 units increasing by 2 units every 2 days until his blood sugar is under 120. He will call me if any difficulties otherwise I will see him in 4 months.

## 2013-04-03 ENCOUNTER — Other Ambulatory Visit: Payer: Self-pay

## 2013-04-03 ENCOUNTER — Telehealth: Payer: Self-pay | Admitting: Family Medicine

## 2013-04-03 MED ORDER — INSULIN GLARGINE 100 UNIT/ML ~~LOC~~ SOLN: 10.0000 [IU] | Freq: Every day | SUBCUTANEOUS | Status: DC

## 2013-04-03 NOTE — Telephone Encounter (Signed)
SENT IN [facility]

## 2013-04-03 NOTE — Telephone Encounter (Signed)
DONE

## 2013-04-06 ENCOUNTER — Telehealth: Payer: Self-pay | Admitting: Family Medicine

## 2013-04-06 ENCOUNTER — Other Ambulatory Visit: Payer: Self-pay | Admitting: Family Medicine

## 2013-04-06 NOTE — Telephone Encounter (Signed)
THIS HAS BEEN DONE

## 2013-04-06 NOTE — Telephone Encounter (Signed)
Patient needs rx for flex Pen      Not vial   [facility]

## 2013-05-21 ENCOUNTER — Telehealth: Payer: Self-pay | Admitting: Internal Medicine

## 2013-05-21 MED ORDER — PIOGLITAZONE HCL-METFORMIN HCL 15-850 MG PO TABS: ORAL_TABLET | ORAL | Status: DC

## 2013-05-21 MED ORDER — ATORVASTATIN CALCIUM 20 MG PO TABS
20.0000 mg | ORAL_TABLET | Freq: Every day | ORAL | Status: DC
Start: 2013-05-21 — End: 2013-11-27

## 2013-05-21 NOTE — Telephone Encounter (Signed)
Pt needs a refill on liptor 20mg , actoplus metformin 15-850mg  to [facility]

## 2013-06-26 ENCOUNTER — Encounter: Payer: Self-pay | Admitting: Family Medicine

## 2013-06-26 ENCOUNTER — Ambulatory Visit (INDEPENDENT_AMBULATORY_CARE_PROVIDER_SITE_OTHER): Payer: BC Managed Care – PPO | Admitting: Family Medicine

## 2013-06-26 VITALS — BP 120/90 | HR 68 | Wt 215.0 lb

## 2013-06-26 DIAGNOSIS — E669 Obesity, unspecified: Secondary | ICD-10-CM

## 2013-06-26 DIAGNOSIS — E119 Type 2 diabetes mellitus without complications: Secondary | ICD-10-CM

## 2013-06-26 DIAGNOSIS — E785 Hyperlipidemia, unspecified: Secondary | ICD-10-CM

## 2013-06-26 LAB — CBC WITH DIFFERENTIAL/PLATELET
Basophils Absolute: 0 10*3/uL (ref 0.0–0.1)
Basophils Relative: 0 % (ref 0–1)
Eosinophils Absolute: 0.2 10*3/uL (ref 0.0–0.7)
Eosinophils Relative: 3 % (ref 0–5)
HCT: 39.7 % (ref 39.0–52.0)
Hemoglobin: 13.4 g/dL (ref 13.0–17.0)
Lymphocytes Relative: 35 % (ref 12–46)
Lymphs Abs: 2.5 10*3/uL (ref 0.7–4.0)
MCH: 29.6 pg (ref 26.0–34.0)
MCHC: 33.8 g/dL (ref 30.0–36.0)
MCV: 87.8 fL (ref 78.0–100.0)
Monocytes Absolute: 0.6 10*3/uL (ref 0.1–1.0)
Monocytes Relative: 8 % (ref 3–12)
Neutro Abs: 3.9 10*3/uL (ref 1.7–7.7)
Neutrophils Relative %: 54 % (ref 43–77)
Platelets: 303 10*3/uL (ref 150–400)
RBC: 4.52 MIL/uL (ref 4.22–5.81)
RDW: 13.8 % (ref 11.5–15.5)
WBC: 7.2 10*3/uL (ref 4.0–10.5)

## 2013-06-26 LAB — COMPREHENSIVE METABOLIC PANEL
ALT: 17 U/L (ref 0–53)
AST: 15 U/L (ref 0–37)
Albumin: 4.1 g/dL (ref 3.5–5.2)
Alkaline Phosphatase: 67 U/L (ref 39–117)
BUN: 15 mg/dL (ref 6–23)
CO2: 26 mEq/L (ref 19–32)
Calcium: 9.2 mg/dL (ref 8.4–10.5)
Chloride: 102 mEq/L (ref 96–112)
Creat: 0.91 mg/dL (ref 0.50–1.35)
Glucose, Bld: 175 mg/dL — ABNORMAL HIGH (ref 70–99)
Potassium: 4 mEq/L (ref 3.5–5.3)
Sodium: 141 mEq/L (ref 135–145)
Total Bilirubin: 0.4 mg/dL (ref 0.2–1.2)
Total Protein: 6.7 g/dL (ref 6.0–8.3)

## 2013-06-26 LAB — LIPID PANEL
Cholesterol: 155 mg/dL (ref 0–200)
HDL: 40 mg/dL (ref >39)
LDL Cholesterol: 84 mg/dL (ref 0–99)
Total CHOL/HDL Ratio: 3.9 Ratio
Triglycerides: 156 mg/dL — ABNORMAL HIGH (ref <150)
VLDL: 31 mg/dL (ref 0–40)

## 2013-06-26 LAB — POCT GLYCOSYLATED HEMOGLOBIN (HGB A1C): Hemoglobin A1C: 10.4

## 2013-06-26 NOTE — Patient Instructions (Addendum)
For the next week to check it 2 hours after one of your meals and call me with the results. Put a fresh battery in. If you're machine needs to be calibrated make sure you do that.

## 2013-06-27 ENCOUNTER — Encounter: Payer: Self-pay | Admitting: Family Medicine

## 2013-06-27 DIAGNOSIS — E669 Obesity, unspecified: Secondary | ICD-10-CM | POA: Insufficient documentation

## 2013-06-27 NOTE — Progress Notes (Signed)
   Subjective:    Patient ID: Alejandro Griffin, male    DOB: 1980/12/12, 33 y.o.   MRN: [id number]  HPI He is here for a recheck on his diabetes. He states that he checks his sugars twice per day usually before meals and they usually are right around 120. His work keeps him very busy. His eating habits are unchanged. Smoking and drinking were also reviewed. He does check his feet regularly and has not had an eye exam recently.   Review of Systems     Objective:   Physical Exam Alert and in no distress. Hemoglobin A1c is 10.9       Assessment & Plan:  Type II or unspecified type diabetes mellitus without mention of complication, not stated as uncontrolled - Plan: HgB A1c, CBC with Differential, Comprehensive metabolic panel, Lipid panel, POCT UA - Microalbumin  Obesity (BMI 30-39.9) - Plan: CBC with Differential, Comprehensive metabolic panel, Lipid panel  Hyperlipidemia LDL goal < 70 - Plan: Lipid panel  I explained that he might need a new glucometer. Recommended that he put fresh battery and and if it has to be calibrated, go ahead and do that also recommend that he check his blood sugars twice per day but 2 hours after meals and call me in one week. He might need glucometer. I might also add and SGLT2 to his regimen.

## 2013-07-26 ENCOUNTER — Encounter: Payer: Self-pay | Admitting: Family Medicine

## 2013-08-06 ENCOUNTER — Ambulatory Visit (INDEPENDENT_AMBULATORY_CARE_PROVIDER_SITE_OTHER): Payer: BC Managed Care – PPO | Admitting: Family Medicine

## 2013-08-06 ENCOUNTER — Encounter: Payer: Self-pay | Admitting: Family Medicine

## 2013-08-06 VITALS — BP 100/72 | HR 68 | Wt 216.0 lb

## 2013-08-06 DIAGNOSIS — E119 Type 2 diabetes mellitus without complications: Secondary | ICD-10-CM

## 2013-08-06 LAB — POCT GLYCOSYLATED HEMOGLOBIN (HGB A1C): Hemoglobin A1C: 8.9

## 2013-08-06 NOTE — Progress Notes (Signed)
   Subjective:    Patient ID: Alejandro Griffin, male    DOB: 10/05/1980, 33 y.o.   MRN: [id number]  HPI He is here for recheck. He did call recently and states his blood sugar was in the high 200 range. Apparently this was only time that it occurred. He has been checking his sugars 2 hours after meals and they run between 160 and 200.   Review of Systems     Objective:   Physical Exam Alert and in no distress. Hemoglobin A1c is 8.9.       Assessment & Plan:  Type II or unspecified type diabetes mellitus without mention of complication, not stated as uncontrolled  it does look like a trend is downward and the two-hour postprandial blood sugars are probably accurate. I explained that the case the hemoglobin A1c should be 7 or lower. I will recheck him in 3 months.

## 2013-08-17 ENCOUNTER — Other Ambulatory Visit: Payer: Self-pay | Admitting: Family Medicine

## 2013-08-17 ENCOUNTER — Encounter: Payer: Self-pay | Admitting: Family Medicine

## 2013-08-17 DIAGNOSIS — E119 Type 2 diabetes mellitus without complications: Secondary | ICD-10-CM

## 2013-08-20 MED ORDER — GLUCOSE BLOOD VI STRP: ORAL_STRIP | Status: DC

## 2013-08-31 ENCOUNTER — Telehealth: Payer: Self-pay | Admitting: Family Medicine

## 2013-08-31 MED ORDER — PIOGLITAZONE HCL-METFORMIN HCL 15-850 MG PO TABS: ORAL_TABLET | ORAL | Status: DC

## 2013-08-31 NOTE — Telephone Encounter (Signed)
Pt needs refill on actosplus met sent to [address]

## 2013-08-31 NOTE — Telephone Encounter (Signed)
done

## 2013-08-31 NOTE — Telephone Encounter (Signed)
Can this patient have a refill on her Actoplus met?

## 2013-09-24 ENCOUNTER — Telehealth: Payer: Self-pay | Admitting: Family Medicine

## 2013-09-24 MED ORDER — PIOGLITAZONE HCL-METFORMIN HCL 15-850 MG PO TABS: ORAL_TABLET | ORAL | Status: DC

## 2013-09-24 MED ORDER — INSULIN GLARGINE 100 UNIT/ML SOLOSTAR PEN: PEN_INJECTOR | SUBCUTANEOUS | Status: DC

## 2013-09-24 NOTE — Telephone Encounter (Signed)
Fax refill request for   pioglitazone-metformin 15-850 90 day supply

## 2013-09-24 NOTE — Telephone Encounter (Signed)
90 day supply given

## 2013-09-28 ENCOUNTER — Telehealth: Payer: Self-pay | Admitting: Internal Medicine

## 2013-09-28 MED ORDER — INSULIN GLARGINE 100 UNIT/ML SOLOSTAR PEN: PEN_INJECTOR | SUBCUTANEOUS | Status: DC

## 2013-09-28 NOTE — Telephone Encounter (Signed)
Pharmacy never received med for pt for a 90 day supply

## 2013-10-26 ENCOUNTER — Encounter: Payer: Self-pay | Admitting: Family Medicine

## 2013-10-26 ENCOUNTER — Ambulatory Visit (INDEPENDENT_AMBULATORY_CARE_PROVIDER_SITE_OTHER): Payer: BC Managed Care – PPO | Admitting: Family Medicine

## 2013-10-26 VITALS — BP 124/80 | HR 91 | Wt 219.0 lb

## 2013-10-26 DIAGNOSIS — E785 Hyperlipidemia, unspecified: Secondary | ICD-10-CM

## 2013-10-26 DIAGNOSIS — E669 Obesity, unspecified: Secondary | ICD-10-CM

## 2013-10-26 DIAGNOSIS — E119 Type 2 diabetes mellitus without complications: Secondary | ICD-10-CM

## 2013-10-26 LAB — POCT GLYCOSYLATED HEMOGLOBIN (HGB A1C): Hemoglobin A1C: 8

## 2013-10-26 MED ORDER — CANAGLIFLOZIN 100 MG PO TABS: 1.0000 | ORAL_TABLET | Freq: Every day | ORAL | Status: DC

## 2013-10-26 MED ORDER — INSULIN PEN NEEDLE 29G X 8MM MISC: Status: DC

## 2013-10-26 NOTE — Progress Notes (Signed)
  Subjective:    Alejandro Griffin is a 33 y.o. male who presents for follow-up of Type 2 diabetes mellitus.    Home blood sugar records: AVG 140  Current symptoms/problems  NONE Daily foot checks:Any foot concerns: NO Last eye exam:  10/13 [facility].   Medication compliance: Good. Current diet: LOW CARB Current exercise: PHYSICAL WORK  Known diabetic complications: none Cardiovascular risk factors: diabetes mellitus, dyslipidemia, hypertension and male gender   The following portions of the patient's history were reviewed and updated as appropriate: allergies, current medications, past medical history, past social history and problem list.  ROS as in subjective above    Objective:   General appearence: alert, no distress, WD/WN   Lab Review Lab Results  Component Value Date   HGBA1C 8.9 08/06/2013   Lab Results  Component Value Date   CHOL 155 06/26/2013   HDL 40 06/26/2013   LDLCALC 84 06/26/2013   TRIG 156* 06/26/2013   CHOLHDL 3.9 06/26/2013   No results found for this basenameDerl Barrow     Chemistry      Component Value Date/Time   NA 141 06/26/2013 1650   K 4.0 06/26/2013 1650   CL 102 06/26/2013 1650   CO2 26 06/26/2013 1650   BUN 15 06/26/2013 1650   CREATININE 0.91 06/26/2013 1650      Component Value Date/Time   CALCIUM 9.2 06/26/2013 1650   ALKPHOS 67 06/26/2013 1650   AST 15 06/26/2013 1650   ALT 17 06/26/2013 1650   BILITOT 0.4 06/26/2013 1650        Chemistry      Component Value Date/Time   NA 141 06/26/2013 1650   K 4.0 06/26/2013 1650   CL 102 06/26/2013 1650   CO2 26 06/26/2013 1650   BUN 15 06/26/2013 1650   CREATININE 0.91 06/26/2013 1650      Component Value Date/Time   CALCIUM 9.2 06/26/2013 1650   ALKPHOS 67 06/26/2013 1650   AST 15 06/26/2013 1650   ALT 17 06/26/2013 1650   BILITOT 0.4 06/26/2013 1650     Hemoglobin A1c is 8    Assessment:   Encounter Diagnoses  Name Primary?  . Type II or unspecified type diabetes  mellitus without mention of complication, not stated as uncontrolled Yes  . Obesity (BMI 30-39.9)   . Hyperlipidemia with target LDL less than 70          Plan:    1.  Rx changes: Invokana 2.  Education: Reviewed 'ABCs' of diabetes management (respective goals in parentheses):  A1C (<7), blood pressure (<130/80), and cholesterol (LDL <100). 3.  Compliance at present is estimated to be good. Efforts to improve compliance (if necessary) will be directed at Continue with his diet and exercise program.. 4. Follow up: 4 months  Continues to make improvement in his A1c. He is interested in trying Invokana. A sample was given. He is to call beginning of the week and let me know how he is doing. Overall I am pleased with his continued lowering of his A1c.

## 2013-10-30 ENCOUNTER — Other Ambulatory Visit: Payer: Self-pay

## 2013-10-30 ENCOUNTER — Telehealth: Payer: Self-pay | Admitting: Internal Medicine

## 2013-10-30 DIAGNOSIS — E119 Type 2 diabetes mellitus without complications: Secondary | ICD-10-CM

## 2013-10-30 MED ORDER — INSULIN PEN NEEDLE 29G X 8MM MISC: 1.0000 | Freq: Every day | Status: DC

## 2013-10-30 NOTE — Telephone Encounter (Signed)
SENT PEN NEDDLES IN WITH DIRECTION

## 2013-10-30 NOTE — Telephone Encounter (Signed)
DONE

## 2013-10-30 NOTE — Telephone Encounter (Signed)
Insulin pen needles 29Gx 36mm needs to have the testing frequency required by insurance. Resend with directions to [facility]

## 2013-11-27 ENCOUNTER — Telehealth: Payer: Self-pay | Admitting: Internal Medicine

## 2013-11-27 ENCOUNTER — Other Ambulatory Visit: Payer: Self-pay

## 2013-11-27 MED ORDER — ATORVASTATIN CALCIUM 20 MG PO TABS: 20.0000 mg | ORAL_TABLET | Freq: Every day | ORAL | Status: DC

## 2013-11-27 NOTE — Telephone Encounter (Signed)
DONE

## 2013-11-27 NOTE — Telephone Encounter (Signed)
Pt needs a refill on lipitor 20mg  to [facility]

## 2013-12-28 ENCOUNTER — Ambulatory Visit (INDEPENDENT_AMBULATORY_CARE_PROVIDER_SITE_OTHER): Payer: BC Managed Care – PPO | Admitting: Family Medicine

## 2013-12-28 VITALS — BP 130/80 | HR 79 | Temp 97.7°F | Wt 211.0 lb

## 2013-12-28 DIAGNOSIS — L738 Other specified follicular disorders: Secondary | ICD-10-CM

## 2013-12-28 DIAGNOSIS — L739 Follicular disorder, unspecified: Secondary | ICD-10-CM

## 2013-12-28 DIAGNOSIS — L678 Other hair color and hair shaft abnormalities: Secondary | ICD-10-CM

## 2013-12-29 NOTE — Progress Notes (Signed)
   Subjective:    Patient ID: Alejandro Griffin, male    DOB: Oct 23, 1980, 33 y.o.   MRN: [id number]  HPI He noted swelling and slight erythema with pain in the right calf area. He thinks he was bitten by an insect. It was larger and cause more difficulty yesterday.   Review of Systems     Objective:   Physical Exam A 1-1/2 cm slightly red dry lesion is noted in the mid calf area.       Assessment & Plan:  Folliculitis  the lesion seems to be healing and I recommended no intervention. He will return here if he has difficulty.

## 2014-02-28 ENCOUNTER — Encounter: Payer: Self-pay | Admitting: Family Medicine

## 2014-02-28 ENCOUNTER — Ambulatory Visit (INDEPENDENT_AMBULATORY_CARE_PROVIDER_SITE_OTHER): Payer: BC Managed Care – PPO | Admitting: Family Medicine

## 2014-02-28 VITALS — BP 120/78 | HR 99 | Wt 211.0 lb

## 2014-02-28 DIAGNOSIS — Z23 Encounter for immunization: Secondary | ICD-10-CM

## 2014-02-28 DIAGNOSIS — E119 Type 2 diabetes mellitus without complications: Secondary | ICD-10-CM

## 2014-02-28 DIAGNOSIS — E785 Hyperlipidemia, unspecified: Secondary | ICD-10-CM

## 2014-02-28 DIAGNOSIS — E1169 Type 2 diabetes mellitus with other specified complication: Secondary | ICD-10-CM | POA: Insufficient documentation

## 2014-02-28 DIAGNOSIS — Z794 Long term (current) use of insulin: Secondary | ICD-10-CM

## 2014-02-28 DIAGNOSIS — E669 Obesity, unspecified: Secondary | ICD-10-CM

## 2014-02-28 LAB — POCT GLYCOSYLATED HEMOGLOBIN (HGB A1C): Hemoglobin A1C: 6.7

## 2014-02-28 MED ORDER — LISINOPRIL 5 MG PO TABS: 5.0000 mg | ORAL_TABLET | Freq: Every day | ORAL | Status: DC

## 2014-02-28 NOTE — Progress Notes (Signed)
  Subjective:    Alejandro Griffin is a 33 y.o. male who presents for follow-up of Type 2 diabetes mellitus.  He has cut down on his Lantus insulin to 16 units due to low blood sugars. Also made some dietary changes. Home blood sugar records: Patient test bid 100 to 160  Current symptoms/problems none Daily foot checks: Any foot concerns:none Last eye exam:  2013 [place]    Medication compliance:good Current diet: none Current exercise:  Work  Known diabetic complications: none Cardiovascular risk factors: diabetes mellitus, dyslipidemia, male gender and obesity (BMI >= 30 kg/m2)   ROS as in subjective above    Objective:   General appearence: alert, no distress, WD/WN Lab Review Lab Results  Component Value Date   HGBA1C 8.0 10/26/2013   Lab Results  Component Value Date   CHOL 155 06/26/2013   HDL 40 06/26/2013   LDLCALC 84 06/26/2013   TRIG 156* 06/26/2013   CHOLHDL 3.9 06/26/2013   No results found for: Derl Barrow   Chemistry      Component Value Date/Time   NA 141 06/26/2013 1650   K 4.0 06/26/2013 1650   CL 102 06/26/2013 1650   CO2 26 06/26/2013 1650   BUN 15 06/26/2013 1650   CREATININE 0.91 06/26/2013 1650      Component Value Date/Time   CALCIUM 9.2 06/26/2013 1650   ALKPHOS 67 06/26/2013 1650   AST 15 06/26/2013 1650   ALT 17 06/26/2013 1650   BILITOT 0.4 06/26/2013 1650        Chemistry      Component Value Date/Time   NA 141 06/26/2013 1650   K 4.0 06/26/2013 1650   CL 102 06/26/2013 1650   CO2 26 06/26/2013 1650   BUN 15 06/26/2013 1650   CREATININE 0.91 06/26/2013 1650      Component Value Date/Time   CALCIUM 9.2 06/26/2013 1650   ALKPHOS 67 06/26/2013 1650   AST 15 06/26/2013 1650   ALT 17 06/26/2013 1650   BILITOT 0.4 06/26/2013 1650         Assessment:  Diabetes mellitus, type II, insulin dependent - Plan: POCT glycosylated hemoglobin (Hb A1C), Ambulatory referral to Ophthalmology  Obesity (BMI  30-39.9)  Hyperlipidemia associated with type 2 diabetes mellitus  Hyperlipidemia with target LDL less than 70  Need for prophylactic vaccination and inoculation against influenza - Plan: Flu Vaccine QUAD 36+ mos IM        Plan:    1.  Rx changes: he has agreed to start taking lisinopril in the low dosing. 2.  Education: Reviewed 'ABCs' of diabetes management (respective goals in parentheses):  A1C (<7), blood pressure (<130/80), and cholesterol (LDL <100). 3.  Compliance at present is estimated to be excellent. Efforts to improve compliance (if necessary) will be directed at increased exercise. 4. Follow up: 4 months    Lisinopril was added to his regimen. Instructed him to cut down on Lantus insulin if his blood sugars continued to be below 100 in the morning. Also discussed possible side effects of lisinopril with him.

## 2014-02-28 NOTE — Patient Instructions (Signed)
Cut back on your insulin dosing but try to keep your blood sugar around the 100 and if you get down to 10 units and the numbers are still good then stop and see what happens and keep me informed. Started on the lisinopril but if you have difficulty with cough or swelling, let me know

## 2014-03-04 ENCOUNTER — Telehealth: Payer: Self-pay | Admitting: Family Medicine

## 2014-03-04 NOTE — Telephone Encounter (Signed)
Pt called and stated that when he picked up his new medication, lisinopril, it states the it was bp medication. Pt is confused because he thought he was being but on something for his kidneys and leaflet didn't say anything about that. Please call pt and explain. Pt can be reached at [phone]

## 2014-03-04 NOTE — Telephone Encounter (Signed)
Let him know that it is a blood pressure medicine that we are using it for kidney preservation.

## 2014-03-05 NOTE — Telephone Encounter (Signed)
Left message that lisinopril is for protection of his kidneys

## 2014-04-03 ENCOUNTER — Other Ambulatory Visit: Payer: Self-pay | Admitting: Family Medicine

## 2014-04-19 DIAGNOSIS — K2970 Gastritis, unspecified, without bleeding: Secondary | ICD-10-CM

## 2014-04-19 DIAGNOSIS — R7989 Other specified abnormal findings of blood chemistry: Secondary | ICD-10-CM

## 2014-04-19 HISTORY — DX: Other specified abnormal findings of blood chemistry: R79.89

## 2014-04-19 HISTORY — DX: Gastritis, unspecified, without bleeding: K29.70

## 2014-05-01 ENCOUNTER — Ambulatory Visit: Payer: Self-pay | Admitting: Family Medicine

## 2014-05-02 ENCOUNTER — Ambulatory Visit (INDEPENDENT_AMBULATORY_CARE_PROVIDER_SITE_OTHER): Payer: BLUE CROSS/BLUE SHIELD | Admitting: Family Medicine

## 2014-05-02 ENCOUNTER — Encounter: Payer: Self-pay | Admitting: Family Medicine

## 2014-05-02 VITALS — BP 122/80 | HR 108 | Wt 212.0 lb

## 2014-05-02 DIAGNOSIS — M7711 Lateral epicondylitis, right elbow: Secondary | ICD-10-CM

## 2014-05-02 DIAGNOSIS — M25511 Pain in right shoulder: Secondary | ICD-10-CM

## 2014-05-02 NOTE — Patient Instructions (Signed)
2 Aleve twice per day hent heat to your shoulder and elbow for 23 times a day. Do as many things as you can palms up and open

## 2014-05-02 NOTE — Progress Notes (Signed)
   Subjective:    Patient ID: Alejandro Griffin, male    DOB: Nov 25, 1980, 34 y.o.   MRN: [id number]  HPI  he complains of a two-month history of right shoulder pain. He has no history of injury. The pain is made worse with any motion. It is not interfering with his work schedule. Is tired and occasional Aleve. He also complains of right elbow pain during the same timeframe. Again no injury to this. He does note pain with gripping something palms down.   Review of Systems     Objective:   Physical Exam  full motion of the shoulder. No palpable tenderness. No laxity noted. Negative drop arm test. Negative supraspinatus testing. Right elbow shows tenderness to palpation over the lateral epicondyle with provocative testing being positive. Full motion of the elbow.       Assessment & Plan:  Right shoulder pain  Lateral epicondylitis (tennis elbow), right  I will treat both of these conservatively with heat and 2 Aleve twice per day. Also instructed him to do his me things palms up and open as possible. If continued difficulty, he will return here.

## 2014-05-05 ENCOUNTER — Other Ambulatory Visit: Payer: Self-pay | Admitting: Family Medicine

## 2014-05-08 ENCOUNTER — Other Ambulatory Visit: Payer: Self-pay

## 2014-05-08 ENCOUNTER — Telehealth: Payer: Self-pay | Admitting: Internal Medicine

## 2014-05-08 NOTE — Telephone Encounter (Signed)
I have placed an 0 copay card up front left message for pt to come by and get it and see if this works so we dont have to change med

## 2014-05-08 NOTE — Telephone Encounter (Signed)
We will need to find out what his insurance will cover out of that category of drug

## 2014-05-08 NOTE — Telephone Encounter (Signed)
Pt called stating that his invokana is $300 for his meds for a 90 day supply and $100 if he does month and he doesn't have that kind of money and wants to know if you can send something else in to [facility]

## 2014-05-09 ENCOUNTER — Ambulatory Visit: Payer: BLUE CROSS/BLUE SHIELD | Admitting: Family Medicine

## 2014-05-15 ENCOUNTER — Telehealth: Payer: Self-pay | Admitting: Family Medicine

## 2014-05-15 MED ORDER — PIOGLITAZONE HCL-METFORMIN HCL 15-850 MG PO TABS: ORAL_TABLET | ORAL | Status: DC

## 2014-05-15 MED ORDER — ATORVASTATIN CALCIUM 20 MG PO TABS: 20.0000 mg | ORAL_TABLET | Freq: Every day | ORAL | Status: DC

## 2014-05-15 NOTE — Telephone Encounter (Signed)
What patient wants to drop the actos and just go plan metformin since he is on the invokana please advise  Actosplus met is 700.00 a month

## 2014-05-15 NOTE — Telephone Encounter (Signed)
Let him know that I called it in 

## 2014-05-15 NOTE — Telephone Encounter (Signed)
Pt changing jobs and will not have insurance for 90 days.  Can you go ahead and order him a 90 day supply for his Actos plus Met and Lipitor so he can get it while he still has insurance.  Please let pt know if this is something you can do [phone]

## 2014-05-16 NOTE — Telephone Encounter (Signed)
Call in an separate the 2 which might make cheaper for him

## 2014-05-16 NOTE — Telephone Encounter (Signed)
Pt said he went ahead and got the med and that he should have enough until he gets ins.

## 2014-05-22 ENCOUNTER — Encounter: Payer: Self-pay | Admitting: Medical

## 2014-05-22 ENCOUNTER — Ambulatory Visit (INDEPENDENT_AMBULATORY_CARE_PROVIDER_SITE_OTHER): Payer: BLUE CROSS/BLUE SHIELD | Admitting: Medical

## 2014-05-22 VITALS — BP 100/70 | HR 74 | Temp 97.6°F | Resp 15 | Wt 205.0 lb

## 2014-05-22 DIAGNOSIS — R05 Cough: Secondary | ICD-10-CM

## 2014-05-22 DIAGNOSIS — R059 Cough, unspecified: Secondary | ICD-10-CM

## 2014-05-22 DIAGNOSIS — R112 Nausea with vomiting, unspecified: Secondary | ICD-10-CM

## 2014-05-22 LAB — POCT URINALYSIS DIPSTICK
Bilirubin, UA: NEGATIVE
Blood, UA: NEGATIVE
Ketones, UA: NEGATIVE
Leukocytes, UA: NEGATIVE
Nitrite, UA: NEGATIVE
Protein, UA: NEGATIVE
Spec Grav, UA: 1.025
Urobilinogen, UA: NEGATIVE
pH, UA: 6

## 2014-05-22 MED ORDER — ONDANSETRON HCL 4 MG PO TABS: 4.0000 mg | ORAL_TABLET | Freq: Three times a day (TID) | ORAL | Status: DC | PRN

## 2014-05-22 NOTE — Progress Notes (Signed)
Subjective: Here for illness.  Started feeling bad yesterday morning at work .  Made it through the day at work but felt bad all day.   He reports nausea, vomiting. Has mild cough, has some runny nose if he vomits, some wheezing occasionally.  Has vomited 3 times today.  Denies ear pain, sore throat, chills, body aches, fever.  Worried about chemical exposure.   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   One of his coworkers also felt nauseated yesterday.   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   He is not aware of any other illnesses of coworkers otherwise.   Denies eating or drinking any contaminated or undercooked foods recently.  Denies alcohol consumption in general. Appetite, diet, hydration is been normal. No other sick contacts.   Using nothing in particular for symptoms except for bland foods.   He is a diabetic.  Has loose stools he attributes to his diabetes medications.  Eats TID, but sometimes skips breakfast.  Trying to eat healthy.   Blood sugars recently running 130 or less fasting in the morning.  No other aggravating or relieving factors. No other complaint.   Objective: BP 100/70 mmHg  Pulse 74  Temp(Src) 97.6 F (36.4 C) (Oral)  Resp 15  Wt 205 lb (92.987 kg)  General appearance: alert, no distress, WD/WN HEENT: normocephalic, sclerae anicteric, TMs pearly, nares patent, no discharge or erythema, pharynx normal Oral cavity: MMM, no lesions Neck: supple, no lymphadenopathy, no thyromegaly, no masses Heart: RRR, normal S1, S2, no murmurs Lungs: CTA bilaterally, no wheezes, rhonchi, or rales Abdomen: +bs, soft, non tender, non distended, no masses, no hepatomegaly, no splenomegaly Pulses: 2+ symmetric, upper and lower extremities, normal cap refill Ext: no  edema    Assessment: Encounter Diagnoses  Name Primary?  . Nausea and vomiting, vomiting of unspecified type Yes  . Cough     Plan: Urinalysis reviewed.  we discussed his symptoms, concerns, possible causes.   Likely this is related to a viral process.    Based on his reports and his recent visits here for diabetes his sugar has been pretty well controlled of late , so I don't suspect anything acute with his diabetes.    although his coworker also has similar symptoms, it doesn't sound like anyone else in the work environment came down with an illness acutely.   He is limited symptoms at this point, normal exam. Advised a few days of relative rest, good hydration, bland diet, Zofran prescribed for nausea and vomiting, advised he use personal protection and respirator type device at work if around chemical fumes or asked to be utilized in a different area until he is feeling back to normal.  Advise if not keeping anything down or not able to hydrate well to hold off on metformin temporarily.   Advised supportive care.  Advised if worse or not improving the next 2-3 days to return

## 2014-05-24 ENCOUNTER — Telehealth: Payer: Self-pay | Admitting: Family Medicine

## 2014-05-24 DIAGNOSIS — E139 Other specified diabetes mellitus without complications: Secondary | ICD-10-CM

## 2014-05-24 MED ORDER — GLUCOSE BLOOD VI STRP: ORAL_STRIP | Status: DC

## 2014-05-24 NOTE — Telephone Encounter (Signed)
done

## 2014-05-24 NOTE — Telephone Encounter (Signed)
Pt called for refills on his test strips. He states his insurance runs out today. [facility] uses [facility] [address]

## 2014-05-28 ENCOUNTER — Other Ambulatory Visit: Payer: Self-pay | Admitting: Family Medicine

## 2014-07-01 ENCOUNTER — Ambulatory Visit: Payer: BC Managed Care – PPO | Admitting: Family Medicine

## 2014-07-03 ENCOUNTER — Other Ambulatory Visit: Payer: Self-pay | Admitting: Family Medicine

## 2014-07-19 ENCOUNTER — Encounter: Payer: Self-pay | Admitting: Family Medicine

## 2014-07-19 ENCOUNTER — Ambulatory Visit (INDEPENDENT_AMBULATORY_CARE_PROVIDER_SITE_OTHER): Payer: BLUE CROSS/BLUE SHIELD | Admitting: Family Medicine

## 2014-07-19 VITALS — BP 100/70 | HR 88 | Wt 203.0 lb

## 2014-07-19 DIAGNOSIS — E669 Obesity, unspecified: Secondary | ICD-10-CM | POA: Diagnosis not present

## 2014-07-19 DIAGNOSIS — E785 Hyperlipidemia, unspecified: Secondary | ICD-10-CM

## 2014-07-19 DIAGNOSIS — E119 Type 2 diabetes mellitus without complications: Secondary | ICD-10-CM | POA: Diagnosis not present

## 2014-07-19 DIAGNOSIS — Z794 Long term (current) use of insulin: Secondary | ICD-10-CM | POA: Diagnosis not present

## 2014-07-19 DIAGNOSIS — Z23 Encounter for immunization: Secondary | ICD-10-CM

## 2014-07-19 DIAGNOSIS — E1169 Type 2 diabetes mellitus with other specified complication: Secondary | ICD-10-CM

## 2014-07-19 LAB — POCT UA - MICROALBUMIN
Albumin/Creatinine Ratio, Urine, POC: 3.7
Creatinine, POC: 162.9 mg/dL
Microalbumin Ur, POC: 6.1 mg/L

## 2014-07-19 LAB — CBC WITH DIFFERENTIAL/PLATELET
Basophils Absolute: 0 10*3/uL (ref 0.0–0.1)
Basophils Relative: 0 % (ref 0–1)
Eosinophils Absolute: 0.2 10*3/uL (ref 0.0–0.7)
Eosinophils Relative: 3 % (ref 0–5)
HCT: 43.5 % (ref 39.0–52.0)
Hemoglobin: 14.5 g/dL (ref 13.0–17.0)
Lymphocytes Relative: 40 % (ref 12–46)
Lymphs Abs: 2.7 10*3/uL (ref 0.7–4.0)
MCH: 29.6 pg (ref 26.0–34.0)
MCHC: 33.3 g/dL (ref 30.0–36.0)
MCV: 88.8 fL (ref 78.0–100.0)
MPV: 8.8 fL (ref 8.6–12.4)
Monocytes Absolute: 0.5 10*3/uL (ref 0.1–1.0)
Monocytes Relative: 8 % (ref 3–12)
Neutro Abs: 3.3 10*3/uL (ref 1.7–7.7)
Neutrophils Relative %: 49 % (ref 43–77)
Platelets: 298 10*3/uL (ref 150–400)
RBC: 4.9 MIL/uL (ref 4.22–5.81)
RDW: 14 % (ref 11.5–15.5)
WBC: 6.7 10*3/uL (ref 4.0–10.5)

## 2014-07-19 LAB — POCT GLYCOSYLATED HEMOGLOBIN (HGB A1C): Hemoglobin A1C: 7.3

## 2014-07-19 MED ORDER — ATORVASTATIN CALCIUM 20 MG PO TABS: 20.0000 mg | ORAL_TABLET | Freq: Every day | ORAL | Status: DC

## 2014-07-19 MED ORDER — PIOGLITAZONE HCL-METFORMIN HCL 15-850 MG PO TABS: ORAL_TABLET | ORAL | Status: DC

## 2014-07-19 NOTE — Progress Notes (Signed)
  Subjective:    Patient ID: Alejandro Griffin, male    DOB: [birth year], 34 y.o.   MRN: [id number]  Alejandro Griffin is a 34 y.o. male who presents for follow-up of Type 2 diabetes mellitus.He is doing well on his medications. He does note a weight loss since being placed on Invokana.   Home blood sugar records: Patient test BID 80 to 150 Current symptoms/problems none Daily foot checks:   Any foot concerns: none Exercise: work  Eyes: appointment made today The following portions of the patient's history were reviewed and updated as appropriate: allergies, current medications, past medical history, past social history and problem list.  ROS as in subjective above.     Objective:    Physical Exam Alert and in no distress otherwise not examined.  Lab Review Diabetic Labs Latest Ref Rng 02/28/2014 10/26/2013 08/06/2013 06/26/2013 01/24/2013  HbA1c - 6.7 8.0 8.9 10.4 8.2  Chol 0 - 200 mg/dL - - - 155 -  HDL >39 mg/dL - - - 40 -  Calc LDL 0 - 99 mg/dL - - - 84 -  Triglycerides <150 mg/dL - - - 156(H) -  Creatinine 0.50 - 1.35 mg/dL - - - 0.91 -   BP/Weight 05/22/2014 05/02/2014 02/28/2014 12/28/2013 9/64/3838  Systolic BP 184 037 543 606 770  Diastolic BP 70 80 78 80 80  Wt. (Lbs) 205 212 211 211 219  BMI 31.62 32.69 32.54 32.54 33.77   Foot/eye exam completion dates 02/08/2012  Eye Exam no diabetic retinopathy  Foot Form Completion -    Alejandro Griffin  reports that he has never smoked. He has never used smokeless tobacco. He reports that he does not drink alcohol or use illicit drugs. Hemoglobin A1c 7.3     Assessment & Plan:   Obesity (BMI 30-39.9) - Plan: CBC with Differential/Platelet, Comprehensive metabolic panel, Lipid panel  Hyperlipidemia with target LDL less than 70 - Plan: CBC with Differential/Platelet, Comprehensive metabolic panel  Hyperlipidemia associated with type 2 diabetes mellitus - Plan: atorvastatin (LIPITOR) 20 MG tablet, Lipid panel  Diabetes mellitus, type II,  insulin dependent - Plan: POCT glycosylated hemoglobin (Hb A1C), POCT UA - Microalbumin, Ambulatory referral to Ophthalmology, pioglitazone-metformin (ACTOPLUS MET) 15-850 MG per tablet, CBC with Differential/Platelet, Comprehensive metabolic panel, Lipid panel  Need for prophylactic vaccination with combined diphtheria-tetanus-pertussis (DTP) vaccine - Plan: Tdap vaccine greater than or equal to 7yo IM   Rx changes: none 1. Education: Reviewed 'ABCs' of diabetes management (respective goals in parentheses):  A1C (<7), blood pressure (<130/80), and cholesterol (LDL <100). 2. Compliance at present is estimated to be fair. Efforts to improve compliance (if necessary) will be directed at dietary modifications: recommend he make sure he gets protein and fats in his diet and potentially has more frequent but smaller meals.. 3. Follow up: 4 months  4. Overall he seems to be doing okay but has had a slight increase in his A1c in spite of being on Invokana.

## 2014-07-20 ENCOUNTER — Encounter: Payer: Self-pay | Admitting: Family Medicine

## 2014-07-20 LAB — COMPREHENSIVE METABOLIC PANEL
ALT: 13 U/L (ref 0–53)
AST: 17 U/L (ref 0–37)
Albumin: 4.4 g/dL (ref 3.5–5.2)
Alkaline Phosphatase: 57 U/L (ref 39–117)
BUN: 18 mg/dL (ref 6–23)
CO2: 25 mEq/L (ref 19–32)
Calcium: 9.4 mg/dL (ref 8.4–10.5)
Chloride: 99 mEq/L (ref 96–112)
Creat: 0.97 mg/dL (ref 0.50–1.35)
Glucose, Bld: 107 mg/dL — ABNORMAL HIGH (ref 70–99)
Potassium: 4.3 mEq/L (ref 3.5–5.3)
Sodium: 134 mEq/L — ABNORMAL LOW (ref 135–145)
Total Bilirubin: 0.7 mg/dL (ref 0.2–1.2)
Total Protein: 7.2 g/dL (ref 6.0–8.3)

## 2014-07-20 LAB — LIPID PANEL
Cholesterol: 171 mg/dL (ref 0–200)
HDL: 55 mg/dL (ref >=40)
LDL Cholesterol: 103 mg/dL — ABNORMAL HIGH (ref 0–99)
Total CHOL/HDL Ratio: 3.1 Ratio
Triglycerides: 67 mg/dL (ref <150)
VLDL: 13 mg/dL (ref 0–40)

## 2014-07-26 LAB — HM DIABETES EYE EXAM

## 2014-08-05 ENCOUNTER — Encounter: Payer: Self-pay | Admitting: Family Medicine

## 2014-08-06 ENCOUNTER — Encounter: Payer: Self-pay | Admitting: Internal Medicine

## 2014-08-16 ENCOUNTER — Other Ambulatory Visit: Payer: Self-pay | Admitting: Medical

## 2014-08-16 ENCOUNTER — Ambulatory Visit (INDEPENDENT_AMBULATORY_CARE_PROVIDER_SITE_OTHER): Payer: BLUE CROSS/BLUE SHIELD | Admitting: Medical

## 2014-08-16 ENCOUNTER — Encounter: Payer: Self-pay | Admitting: Medical

## 2014-08-16 VITALS — BP 110/70 | HR 82 | Resp 15 | Wt 213.0 lb

## 2014-08-16 DIAGNOSIS — R5383 Other fatigue: Secondary | ICD-10-CM | POA: Diagnosis not present

## 2014-08-16 DIAGNOSIS — R6882 Decreased libido: Secondary | ICD-10-CM

## 2014-08-16 DIAGNOSIS — E1165 Type 2 diabetes mellitus with hyperglycemia: Secondary | ICD-10-CM

## 2014-08-16 DIAGNOSIS — IMO0002 Reserved for concepts with insufficient information to code with codable children: Secondary | ICD-10-CM

## 2014-08-16 LAB — TSH: TSH: 77.161 u[IU]/mL — ABNORMAL HIGH (ref 0.350–4.500)

## 2014-08-16 NOTE — Progress Notes (Signed)
Subjective: Here for concern for testosterone level.  Wife encouraged him to come in to discuss.   Lately feels fatigued often, irritable at times.   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   Wife says he is irritable, lack of desire for sex sometimes, feel fatigued and tired.  Exercise - not much due to work schedule, but walks all the time at work.   Trying to eat healthy, but eats some fried foods, avoids high sugar foods.  Drinks a few sodas per day.  Nonsmoker.  No alcohol.   Attends church.  Has 9yo son.  Enjoys playing baseball with his son.   Denies depression.  Gets to sleep fine, but sometimes wakes up out of the blue.  Sleep is usually ok, but sometimes not.  Due to work schedule, not always getting good sleep.    Snores sometimes.   No witnessed apnea . No daytime somnolence.   No other aggravating or relieving factors. No other complaint.  Past Medical History  Diagnosis Date  . Diabetes mellitus without complication    ROS as in subjective  Objective: BP 110/70 mmHg  Pulse 82  Resp 15  Wt 213 lb (96.616 kg)  Gen: wd, wn, nad Psych: pleasant, good eye contact, answers questions appropriately  Assessment: Encounter Diagnoses  Name Primary?  . Other fatigue Yes  . Low libido   . Diabetes type 2, uncontrolled     Plan: reviewed labs from earlier this month for diabetes.  Discussed his concerns, talking with wife, both wife and he being supportive of each other, understanding that he won't necessarily feel energetic after a 10 hour work day.   Advised they look at their time management, get exercise regularly, eat healthy, consider marriage counseling to help work through concerns.   Discussed his medical issues, diabetes.   Will check TSH, TST, vit D.   Discussed the potential for repeat confirmation TST level if initial test is low.   F/u pending labs.

## 2014-08-17 LAB — TESTOSTERONE: Testosterone: 278 ng/dL — ABNORMAL LOW (ref 300–890)

## 2014-08-17 LAB — VITAMIN D 25 HYDROXY (VIT D DEFICIENCY, FRACTURES): Vit D, 25-Hydroxy: 20 ng/mL — ABNORMAL LOW (ref 30–100)

## 2014-08-19 ENCOUNTER — Other Ambulatory Visit: Payer: Self-pay | Admitting: Medical

## 2014-08-19 DIAGNOSIS — R7989 Other specified abnormal findings of blood chemistry: Secondary | ICD-10-CM

## 2014-08-19 MED ORDER — VITAMIN D (ERGOCALCIFEROL) 1.25 MG (50000 UNIT) PO CAPS: 50000.0000 [IU] | ORAL_CAPSULE | ORAL | Status: DC

## 2014-08-20 LAB — T4: T4, Total: 3.1 ug/dL — ABNORMAL LOW (ref 4.5–12.0)

## 2014-08-20 LAB — T3: T3, Total: 77.8 ng/dL — ABNORMAL LOW (ref 80.0–204.0)

## 2014-08-26 ENCOUNTER — Ambulatory Visit (INDEPENDENT_AMBULATORY_CARE_PROVIDER_SITE_OTHER): Payer: BLUE CROSS/BLUE SHIELD | Admitting: Medical

## 2014-08-26 ENCOUNTER — Encounter: Payer: Self-pay | Admitting: Medical

## 2014-08-26 VITALS — BP 100/60 | HR 105 | Temp 98.1°F | Resp 15 | Wt 208.0 lb

## 2014-08-26 DIAGNOSIS — R21 Rash and other nonspecific skin eruption: Secondary | ICD-10-CM | POA: Diagnosis not present

## 2014-08-26 DIAGNOSIS — R946 Abnormal results of thyroid function studies: Secondary | ICD-10-CM | POA: Diagnosis not present

## 2014-08-26 DIAGNOSIS — R292 Abnormal reflex: Secondary | ICD-10-CM | POA: Diagnosis not present

## 2014-08-26 DIAGNOSIS — E291 Testicular hypofunction: Secondary | ICD-10-CM | POA: Diagnosis not present

## 2014-08-26 DIAGNOSIS — E039 Hypothyroidism, unspecified: Secondary | ICD-10-CM | POA: Insufficient documentation

## 2014-08-26 DIAGNOSIS — R7989 Other specified abnormal findings of blood chemistry: Secondary | ICD-10-CM

## 2014-08-26 DIAGNOSIS — R5383 Other fatigue: Secondary | ICD-10-CM | POA: Diagnosis not present

## 2014-08-26 DIAGNOSIS — E559 Vitamin D deficiency, unspecified: Secondary | ICD-10-CM | POA: Diagnosis not present

## 2014-08-26 MED ORDER — MUPIROCIN 2 % EX OINT: 1.0000 "application " | TOPICAL_OINTMENT | Freq: Two times a day (BID) | CUTANEOUS | Status: DC

## 2014-08-26 NOTE — Progress Notes (Signed)
Subjective: Here for discussion of recent abnormal labs, accompanied by wife today.   Per last visit he had been having concerns about feeling fatigued often, irritable at times.   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   Wife says he is irritable, lack of desire for sex sometimes, feel fatigued and tired.  Exercise - not much due to work schedule, but walks all the time at work.   Trying to eat healthy, but eats some fried foods, avoids high sugar foods.  Drinks a few sodas per day.  Nonsmoker.  No alcohol.   Attends church.  Has 9yo son.  Enjoys playing baseball with his son.   Denies depression.  Gets to sleep fine, but sometimes wakes up out of the blue.  Sleep is usually ok, but sometimes not.  Due to work schedule, not always getting good sleep.    Snores sometimes.   No witnessed apnea . No daytime somnolence.   No other aggravating or relieving factors. No other complaint.  Past Medical History  Diagnosis Date  . Diabetes mellitus without complication    ROS as in subjective  Objective: BP 100/60 mmHg  Pulse 105  Temp(Src) 98.1 F (36.7 C) (Oral)  Resp 15  Wt 208 lb (94.348 kg)  Gen: wd, wn, nad Skin: left hand dorsal surface between thumb and 2nd finger with 60mm slightly raised lesion, nonspecific, left mid to lower back with 71mm slightly raised erythematous lesions, nonspecific Lungs clear Heart RRR normal S1 s2, no murmurs Neck: supple, nontender, no mass, no thyromegaly, no lymphadenopathy Ext: no edema Pulses normal Psych: pleasant, good eye contact, answers questions appropriately  Assessment: Encounter Diagnoses  Name Primary?  . Abnormal thyroid blood test Yes  . Low testosterone   . Other fatigue   . Abnormal DTR (deep tendon reflex)   . Rash and nonspecific skin eruption   . Vitamin D deficiency     Plan: Reviewed recent abnormal labs including low testosterone, abnormal TSH,  T3 and T4, low Vit D.  He has already begun weekly prescription vitamin D.  Will check additional thyroid labs.  If confirmatory, will begin Synthroid.  Will hold off on testosterone therapy for the time being given the thyroid situation.  Dicussed likelihood of autoimmune issue given his early age onset of diabetes and associated findings at this time.   We did end up discussing possible androgel therapy, use, risks/benefits of medication.    Rash - begin Mupirocin  F/u pending labs.

## 2014-08-27 ENCOUNTER — Other Ambulatory Visit: Payer: Self-pay | Admitting: Medical

## 2014-08-27 LAB — THYROID PEROXIDASE ANTIBODY: Thyroperoxidase Ab SerPl-aCnc: 48 IU/mL — ABNORMAL HIGH (ref <9)

## 2014-08-27 LAB — T4, FREE: Free T4: 0.46 ng/dL — ABNORMAL LOW (ref 0.80–1.80)

## 2014-08-27 LAB — TSH: TSH: 88.524 u[IU]/mL — ABNORMAL HIGH (ref 0.350–4.500)

## 2014-08-27 MED ORDER — SYNTHROID 75 MCG PO TABS: 75.0000 ug | ORAL_TABLET | Freq: Every day | ORAL | Status: DC

## 2014-09-02 ENCOUNTER — Telehealth: Payer: Self-pay | Admitting: Medical

## 2014-09-02 ENCOUNTER — Encounter: Payer: Self-pay | Admitting: Medical

## 2014-09-02 NOTE — Telephone Encounter (Signed)
Pt called and stated he was unable to go to work on Saturday. He states he thinks he is having trouble adjusting to new medication. He is requesting a note for sat and his work is requiring a note stating its ok for him to return to work. Please advise. Pt can be reached at [phone].

## 2014-09-02 NOTE — Telephone Encounter (Signed)
Note is fine, can write letter saying he is ok to return for work  Lets start go lower dose for now.  I have samples of 25 and 50 mcg.  Have him take the box of 25mg  tablets daily, then go to 54mcg then recheck level at that point

## 2014-09-02 NOTE — Telephone Encounter (Signed)
Pt informed note ready for him to pick up and explained samples and dosage

## 2014-09-09 ENCOUNTER — Telehealth: Payer: Self-pay | Admitting: Internal Medicine

## 2014-09-09 NOTE — Telephone Encounter (Signed)
Yes possibly, so either just stick with 25 mcg daily taken in the morning on empty stomach 1 hour before breakfast, or just stop the medication for 3-4 days to see if symptom go away.   Most people don't have this much problem getting iinitiatedon thyroid mmedication so we may just need to go very slow with lowest dose.

## 2014-09-09 NOTE — Telephone Encounter (Signed)
Pt called stating that he has thrown up twice today and he has been decreasing his synthroid med. He was at 79mcg then on Saturday went down to 84mcg and today he went down to 52mcg. Could that be a side effect from him decreasing his synthroid. Please advise.

## 2014-09-09 NOTE — Telephone Encounter (Signed)
Pt called and advised of same

## 2014-09-12 ENCOUNTER — Encounter: Payer: Self-pay | Admitting: Family Medicine

## 2014-09-26 ENCOUNTER — Ambulatory Visit (INDEPENDENT_AMBULATORY_CARE_PROVIDER_SITE_OTHER): Payer: 59 | Admitting: Medical

## 2014-09-26 ENCOUNTER — Other Ambulatory Visit: Payer: Self-pay | Admitting: Medical

## 2014-09-26 ENCOUNTER — Encounter: Payer: Self-pay | Admitting: Medical

## 2014-09-26 VITALS — BP 110/70 | HR 78 | Temp 97.8°F | Resp 15 | Wt 205.0 lb

## 2014-09-26 DIAGNOSIS — T887XXA Unspecified adverse effect of drug or medicament, initial encounter: Secondary | ICD-10-CM | POA: Diagnosis not present

## 2014-09-26 DIAGNOSIS — R1111 Vomiting without nausea: Secondary | ICD-10-CM

## 2014-09-26 DIAGNOSIS — E038 Other specified hypothyroidism: Secondary | ICD-10-CM | POA: Diagnosis not present

## 2014-09-26 DIAGNOSIS — T50905A Adverse effect of unspecified drugs, medicaments and biological substances, initial encounter: Secondary | ICD-10-CM

## 2014-09-26 LAB — T4, FREE: Free T4: 0.9 ng/dL (ref 0.80–1.80)

## 2014-09-26 LAB — BASIC METABOLIC PANEL
BUN: 12 mg/dL (ref 6–23)
CO2: 23 mEq/L (ref 19–32)
Calcium: 9.7 mg/dL (ref 8.4–10.5)
Chloride: 101 mEq/L (ref 96–112)
Creat: 0.93 mg/dL (ref 0.50–1.35)
Glucose, Bld: 129 mg/dL — ABNORMAL HIGH (ref 70–99)
Potassium: 4 mEq/L (ref 3.5–5.3)
Sodium: 136 mEq/L (ref 135–145)

## 2014-09-26 LAB — TSH: TSH: 33.117 u[IU]/mL — ABNORMAL HIGH (ref 0.350–4.500)

## 2014-09-26 MED ORDER — DEXLANSOPRAZOLE 60 MG PO CPDR: 60.0000 mg | DELAYED_RELEASE_CAPSULE | Freq: Every day | ORAL | Status: DC

## 2014-09-26 NOTE — Progress Notes (Signed)
Subjective: Here for vomiting. We started him on synthroid a few weeks ago and he has not tolerated this well.  For the last few weeks, getting intermittent vomiting out of the blue.  No nausea, no dizziness, no sweating, just random vomiting, maybe once every several days.  No temporal relationship to eating or types of food. Denies heartburn or GERD. He is a nonsmoker, no alcohol use ever.  He denies vomiting blood, no fever, no weight loss.  No neck pain, not feeling sick in general.  Denies over eating, no recent spicy foods.  He hasn't had this prior to starting synthroid.   Sugars have been ok of late.  No other aggravating or relieving factors. No other complaint.     Objective: BP 110/70 mmHg  Pulse 78  Temp(Src) 97.8 F (36.6 C) (Oral)  Resp 15  Wt 205 lb (92.987 kg)  General appearance: alert, no distress, WD/WN HEENT: normocephalic, sclerae anicteric, TMs pearly, nares patent, no discharge or erythema, pharynx normal Oral cavity: MMM, no lesions Neck: supple, no lymphadenopathy, no thyromegaly, no masses Heart: RRR, normal S1, S2, no murmurs Lungs: CTA bilaterally, no wheezes, rhonchi, or rales Abdomen: +bs, soft, non tender, non distended, no masses, no hepatomegaly, no splenomegaly Pulses: 2+ symmetric, upper and lower extremities, normal cap refill   Assessment: Encounter Diagnoses  Name Primary?  . Intractable vomiting without nausea, vomiting of unspecified type Yes  . Other specified hypothyroidism   . Adverse effects of medication, initial encounter     Plan: Begin samples of Dexilant for possible GERD . Labs today.   Consider changing from Synthroid if symptoms continue.  Avoid GERD triggers.  F/u pending labs

## 2014-09-30 ENCOUNTER — Other Ambulatory Visit: Payer: Self-pay | Admitting: Medical

## 2014-09-30 ENCOUNTER — Encounter: Payer: Self-pay | Admitting: Medical

## 2014-09-30 MED ORDER — LEVOTHYROXINE SODIUM 50 MCG PO TABS: 50.0000 ug | ORAL_TABLET | Freq: Every day | ORAL | Status: DC

## 2014-10-01 ENCOUNTER — Telehealth: Payer: Self-pay | Admitting: Medical

## 2014-10-01 ENCOUNTER — Other Ambulatory Visit: Payer: Self-pay | Admitting: Medical

## 2014-10-01 DIAGNOSIS — R7989 Other specified abnormal findings of blood chemistry: Secondary | ICD-10-CM

## 2014-10-01 NOTE — Telephone Encounter (Signed)
pls have him swing by for a Prolactin level, blood test unless Denice Paradise is able to add on from last labs.    Given his unusual vomiting, I want to make sure this isn't abnormal.  Is he having headaches or vision changes?

## 2014-10-01 NOTE — Telephone Encounter (Signed)
I check with Denice Paradise and she did call the lab and add on the additional test.

## 2014-10-02 LAB — PROLACTIN: Prolactin: 6.2 ng/mL (ref 2.1–17.1)

## 2014-10-03 NOTE — Telephone Encounter (Signed)
Call and see if any improvements?  Still having the vomiting?  Any new symptoms?

## 2014-10-03 NOTE — Telephone Encounter (Signed)
LM to CB

## 2014-10-10 ENCOUNTER — Other Ambulatory Visit: Payer: Self-pay

## 2014-10-10 ENCOUNTER — Encounter: Payer: Self-pay | Admitting: Medical

## 2014-10-10 ENCOUNTER — Telehealth: Payer: Self-pay | Admitting: Medical

## 2014-10-10 ENCOUNTER — Encounter: Payer: Self-pay | Admitting: Nurse Practitioner

## 2014-10-10 ENCOUNTER — Telehealth: Payer: Self-pay

## 2014-10-10 ENCOUNTER — Ambulatory Visit (INDEPENDENT_AMBULATORY_CARE_PROVIDER_SITE_OTHER): Payer: 59 | Admitting: Medical

## 2014-10-10 VITALS — BP 110/78 | HR 84 | Temp 97.8°F | Resp 16 | Wt 199.0 lb

## 2014-10-10 DIAGNOSIS — R7989 Other specified abnormal findings of blood chemistry: Secondary | ICD-10-CM

## 2014-10-10 DIAGNOSIS — E291 Testicular hypofunction: Secondary | ICD-10-CM

## 2014-10-10 DIAGNOSIS — R112 Nausea with vomiting, unspecified: Secondary | ICD-10-CM

## 2014-10-10 DIAGNOSIS — E119 Type 2 diabetes mellitus without complications: Secondary | ICD-10-CM | POA: Diagnosis not present

## 2014-10-10 DIAGNOSIS — E038 Other specified hypothyroidism: Secondary | ICD-10-CM

## 2014-10-10 DIAGNOSIS — R1111 Vomiting without nausea: Secondary | ICD-10-CM

## 2014-10-10 NOTE — Telephone Encounter (Signed)
Alejandro Griffin, patient did not return my call last week. I have left another message today.

## 2014-10-10 NOTE — Telephone Encounter (Signed)
Refer to GI for spontaneous vomiting without nausea.    He is out of town 10/17/14 - 10/22/14.

## 2014-10-10 NOTE — Telephone Encounter (Signed)
Patient called back, has been doing well but started terrible vomiting and chills this am. Has started taking Synthroid at hs, does he need to be seen?

## 2014-10-10 NOTE — Telephone Encounter (Signed)
Any other symptoms?  Yes may be good idea to be seen.  May end up needing to see GI if not other cause fits.

## 2014-10-10 NOTE — Progress Notes (Signed)
Subjective: Here for vomiting again, accompanied by wife.  At his last visit 09/26/14, he had been having several episodes of spontaneous vomiting without nausea or other associated symptoms for no reason.   These episodes started happening about the same time we started him on synthroid.  Last week we switched to Levothyroxine in the rare event Synthroid was the causative factor.  Last week he had no episodes, felt fine.   Today at work out of the blue this morning had an episode of vomiting followed by chills.  He is still getting use to taking thyroid medication on empty stomach an hour before breakfast.  He is still taking Dexilant started last week.  He is worried about his job as his boss is warning him about absences.   He is relatively new at his job.  He is taking Levothyroxine currently.  He does drink sprite 1-2 times daily.  Blood sugars are checked BID and are typically 110-130.   No recent highs or lows.   No other aggravating or relieving factors. No other complaint.  ROS as in subjective    Objective: BP 110/78 mmHg  Pulse 84  Temp(Src) 97.8 F (36.6 C) (Oral)  Resp 16  Wt 199 lb (90.266 kg)  General appearance: alert, no distress, WD/WN HEENT: normocephalic, sclerae anicteric, TMs pearly, nares patent, no discharge or erythema, pharynx normal Oral cavity: MMM, no lesions Neck: supple, no lymphadenopathy, no thyromegaly, no masses Heart: RRR, normal S1, S2, no murmurs Lungs: CTA bilaterally, no wheezes, rhonchi, or rales Abdomen: +bs, soft, non tender, non distended, no masses, no hepatomegaly, no splenomegaly Pulses: 2+ symmetric, upper and lower extremities, normal cap refill   Assessment: Encounter Diagnoses  Name Primary?  . Vomiting without nausea, vomiting of unspecified type Yes  . Diabetes type 2, controlled   . Other specified hypothyroidism   . Hypogonadism in male   . Low testosterone     Plan: discussed case and recent labs with Dr. Redmond School supervising  physician.  STOP dexilant.   Referral to GI for unexplained vomiting.   C/t levothyroxine, c/t diabetes medications as usual.  Begin samples of Dexilant for possible GERD . Labs today.   Consider changing from Synthroid if symptoms continue.  Avoid GERD triggers.  F/u pending labs

## 2014-10-10 NOTE — Telephone Encounter (Signed)
Scheduled for today at 12

## 2014-10-10 NOTE — Telephone Encounter (Signed)
Sent order through [facility] to GI for referral pt OOT 6/30-7/5

## 2014-10-16 ENCOUNTER — Telehealth: Payer: Self-pay | Admitting: Family Medicine

## 2014-10-16 NOTE — Telephone Encounter (Signed)
Pt called and said you wanted to know if he had another episode of vomiting and he just did.  Pt ph [phone]

## 2014-10-16 NOTE — Telephone Encounter (Signed)
Where are we at with GI referral?

## 2014-10-17 NOTE — Telephone Encounter (Signed)
His appointment is 10/25/14 [facility]

## 2014-10-25 ENCOUNTER — Encounter: Payer: Self-pay | Admitting: Nurse Practitioner

## 2014-10-25 ENCOUNTER — Ambulatory Visit (INDEPENDENT_AMBULATORY_CARE_PROVIDER_SITE_OTHER): Payer: 59 | Admitting: Nurse Practitioner

## 2014-10-25 VITALS — BP 120/78 | HR 80 | Ht 67.5 in | Wt 196.8 lb

## 2014-10-25 DIAGNOSIS — R1110 Vomiting, unspecified: Secondary | ICD-10-CM | POA: Insufficient documentation

## 2014-10-25 DIAGNOSIS — R1111 Vomiting without nausea: Secondary | ICD-10-CM

## 2014-10-25 NOTE — Patient Instructions (Addendum)
You have been scheduled for an endoscopy. Please follow written instructions given to you at your visit today. If you use inhalers (even only as needed), please bring them with you on the day of your procedure. Your physician has requested that you go to www.startemmi.com and enter the access code given to you at your visit today. This web site gives a general overview about your procedure. However, you should still follow specific instructions given to you by our office regarding your preparation for the procedure.            Normal BMI (Body Mass Index- based on height and weight) is between 19 and 25. Your BMI today is Body mass index is 30.35 kg/(m^2). Marland Kitchen Please consider follow up  regarding your BMI with your Primary Care Provider.

## 2014-10-25 NOTE — Progress Notes (Addendum)
HPI :  Patient is a 34 year old male referred by PCP for evaluation of vomiting. The vomiting is not associated with nausea. No associated abdominal pain. Vomiting is not necessarily postprandial, it occurs at random times. Vomiting started in May around the time patient was total Synthroid. The center was changed to levothyroxine  his TSH has come down from 88 to mid thirties. Emesis usually does not contain undigested food. Patient does not feel work-related stress as a contributing factor. His supervisor has said however that if loss of work continues patient may be let go. Patient has an esophageal cancer. Patient and wife concerned about the vomiting and this family history. There is been no unusual weight loss. Patient has no GERD symptoms, no bowel problems, no other GI complaints  Past Medical History  Diagnosis Date  . Diabetes mellitus without complication   . Obesity   . Low testosterone   . Hyperlipidemia   . Vitamin D deficiency     Family History  Problem Relation Age of Onset  . Hypertension Mother   . Diabetes Maternal Grandmother   . Cancer Paternal Grandfather    History  Substance Use Topics  . Smoking status: Never Smoker   . Smokeless tobacco: Never Used  . Alcohol Use: No   Current Outpatient Prescriptions  Medication Sig Dispense Refill  . atorvastatin (LIPITOR) 20 MG tablet Take 1 tablet (20 mg total) by mouth daily. 90 tablet 3  . Blood Glucose Monitoring Suppl (ONE TOUCH ULTRA SYSTEM KIT) W/DEVICE KIT 1 kit by Does not apply route once. 1 each 0  . glucose blood (ONE TOUCH TEST STRIPS) test strip Test sugars 2 hours after meals daily 300 each 0  . Insulin Glargine (LANTUS SOLOSTAR) 100 UNIT/ML Solostar Pen INJECT 18 UNITS INTO THE SKIN AT BEDTIME. (Patient taking differently: Inject 14 Units into the skin daily at 10 pm. INJECT 14UNITS INTO THE SKIN AT BEDTIME.) 10 pen 2  . Insulin Pen Needle 29G X 8MM MISC 1 each by Other route daily. USE AS DIRECTED TO  BE USED WITH INSULIN PEN 200 each PRN  . INVOKANA 100 MG TABS tablet TAKE 1 TABLET (100 MG TOTAL) BY MOUTH DAILY. 90 tablet 1  . Lancets (ONETOUCH ULTRASOFT) lancets Use as instructed 100 each 12  . levothyroxine (SYNTHROID, LEVOTHROID) 50 MCG tablet Take 1 tablet (50 mcg total) by mouth daily before breakfast. 30 tablet 1  . lisinopril (PRINIVIL,ZESTRIL) 5 MG tablet Take 1 tablet (5 mg total) by mouth daily. 90 tablet 3  . ondansetron (ZOFRAN) 4 MG tablet Take 1 tablet (4 mg total) by mouth every 8 (eight) hours as needed for nausea or vomiting. 20 tablet 0  . ONE TOUCH ULTRA TEST test strip USE AS INSTRUCTED 50 each 2  . pioglitazone-metformin (ACTOPLUS MET) 15-850 MG per tablet TAKE 1 TABLET BY MOUTH TWICE A DAY WITH A MEAL 180 tablet 1   No current facility-administered medications for this visit.   No Known Allergies   Review of Systems: All systems reviewed and negative except where noted in HPI.    Physical Exam: BP 120/78 mmHg  Pulse 80  Ht 5' 7.5" (1.715 m)  Wt 196 lb 12.8 oz (89.268 kg)  BMI 30.35 kg/m2 Constitutional: Pleasant,well-developed, white male in no acute distress. HEENT: Normocephalic and atraumatic. Conjunctivae are normal. No scleral icterus. Neck supple.  Cardiovascular: Normal rate, regular rhythm.  Pulmonary/chest: Effort normal and breath sounds normal. No wheezing, rales or rhonchi. Abdominal: Soft, nondistended, nontender.  Bowel sounds active throughout. There are no masses palpable. No hepatomegaly. Extremities: no edema Lymphadenopathy: No cervical adenopathy noted. Neurological: Alert and oriented to person place and time. Skin: Skin is warm and dry. No rashes noted. Psychiatric: Normal mood and affect. Behavior is normal.   ASSESSMENT AND PLAN:  68. 34 year old male with two-month history of vomiting in the absence of nausea.. Episodes start with eating which leads to vomiting but again there is no preceding nausea. Patient cannot correlate  symptoms with any certain foods, activity, nor work-related stress. He has no GERD symptoms, no neurologic symptoms (headaches, dizziness, etc...). Etiology unclear. Will schedule EGD for further evaluation. The benefits, risks, and potential complications of EGD with possible biopsies were discussed with the patient and he agrees to proceed.   2. Type II diabetes. Blood sugars averaging 120-150 at home.    Cc: Chana Bode, P.A.Marland Kitchen-C  Addendum: Reviewed and agree with initial management. Jerene Bears, MD

## 2014-11-01 ENCOUNTER — Other Ambulatory Visit: Payer: Self-pay | Admitting: Medical

## 2014-11-01 MED ORDER — INSULIN DETEMIR 100 UNIT/ML ~~LOC~~ SOLN: 18.0000 [IU] | Freq: Every day | SUBCUTANEOUS | Status: DC

## 2014-11-07 ENCOUNTER — Encounter: Payer: Self-pay | Admitting: Internal Medicine

## 2014-11-07 ENCOUNTER — Ambulatory Visit (AMBULATORY_SURGERY_CENTER): Payer: 59 | Admitting: Internal Medicine

## 2014-11-07 ENCOUNTER — Encounter: Payer: Self-pay | Admitting: *Deleted

## 2014-11-07 VITALS — BP 98/61 | HR 76 | Temp 96.9°F | Resp 20 | Ht 67.5 in | Wt 196.0 lb

## 2014-11-07 DIAGNOSIS — R1111 Vomiting without nausea: Secondary | ICD-10-CM | POA: Diagnosis not present

## 2014-11-07 DIAGNOSIS — R112 Nausea with vomiting, unspecified: Secondary | ICD-10-CM

## 2014-11-07 DIAGNOSIS — K259 Gastric ulcer, unspecified as acute or chronic, without hemorrhage or perforation: Secondary | ICD-10-CM | POA: Diagnosis not present

## 2014-11-07 MED ORDER — SODIUM CHLORIDE 0.9 % IV SOLN: 500.0000 mL | INTRAVENOUS | Status: DC

## 2014-11-07 MED ORDER — PANTOPRAZOLE SODIUM 40 MG PO TBEC: DELAYED_RELEASE_TABLET | ORAL | Status: DC

## 2014-11-07 NOTE — Op Note (Signed)
[facility]  [address] [place], [address]   ENDOSCOPY PROCEDURE REPORT  PATIENT: Alejandro, Griffin  MR#: [id number] BIRTHDATE: Aug 04, 1980 , 34  yrs. old GENDER: male ENDOSCOPIST: Jerene Bears, MD REFERRED BY:  Jill Alexanders, M.D. PROCEDURE DATE:  11/07/2014 PROCEDURE:  EGD, diagnostic and EGD w/ biopsy ASA CLASS:     Class II INDICATIONS:  vomiting. MEDICATIONS: Monitored anesthesia care and Propofol 200 mg IV TOPICAL ANESTHETIC: none  DESCRIPTION OF PROCEDURE: After the risks benefits and alternatives of the procedure were thoroughly explained, informed consent was obtained.  The LB [id number] endoscope was introduced through the mouth and advanced to the second portion of the duodenum , Without limitations.  The instrument was slowly withdrawn as the mucosa was fully examined.   ESOPHAGUS: The mucosa of the esophagus appeared normal.   Z-line regular at 39 cm.  STOMACH: Moderate acute gastritis (inflammation) was found in the gastric antrum and prepyloric region stomach.  There were erosions present.  Cold forcep biopsies were taken at the gastric body, antrum and angularis to evaluate for h.  pylori.  DUODENUM: The duodenal mucosa showed no abnormalities in the bulb and 2nd part of the duodenum.  Retroflexed views revealed no abnormalities.     The scope was then withdrawn from the patient and the procedure completed.  COMPLICATIONS: There were no immediate complications.  ENDOSCOPIC IMPRESSION: 1.   The mucosa of the esophagus appeared normal 2.   Acute gastritis (inflammation) was found in the gastric antrum and prepyloric region stomach; multiple biopsies 3.   The duodenal mucosa showed no abnormalities in the bulb and 2nd part of the duodenum  RECOMMENDATIONS: 1.  Await pathology results 2.  Follow-up of helicobacter pylori status, treat if indicated 3.  Begin pantoprazole 40 mg daily, 30 minutes before breakfast. 4.  Office  follow-up in 6-8 weeks to ensure improvement.  If vomiting continues after treatment for gastritis would consider gastric emptying study  eSigned:  Jerene Bears, MD 11/07/2014 9:19 AM  AL:[employer] [employer], MD and The Patient

## 2014-11-07 NOTE — Patient Instructions (Addendum)
YOU HAD AN ENDOSCOPIC PROCEDURE TODAY AT [facility]:   Refer to the procedure report that was given to you for any specific questions about what was found during the examination.  If the procedure report does not answer your questions, please call your gastroenterologist to clarify.  If you requested that your care partner not be given the details of your procedure findings, then the procedure report has been included in a sealed envelope for you to review at your convenience later.  YOU SHOULD EXPECT: Some feelings of bloating in the abdomen. Passage of more gas than usual.  Walking can help get rid of the air that was put into your GI tract during the procedure and reduce the bloating.   Please Note:  You might notice some irritation and congestion in your nose or some drainage.  This is from the oxygen used during your procedure.  There is no need for concern and it should clear up in a day or so.  SYMPTOMS TO REPORT IMMEDIATELY:  Following upper endoscopy (EGD)  Vomiting of blood or coffee ground material  New chest pain or pain under the shoulder blades  Painful or persistently difficult swallowing  New shortness of breath  Fever of 100F or higher  Black, tarry-looking stools  For urgent or emergent issues, a gastroenterologist can be reached at any hour by calling [phone].   DIET: Your first meal following the procedure should be a small meal and then it is ok to progress to your normal diet. Heavy or fried foods are harder to digest and may make you feel nauseous or bloated.  Likewise, meals heavy in dairy and vegetables can increase bloating.  Drink plenty of fluids but you should avoid alcoholic beverages for 24 hours.  ACTIVITY:  You should plan to take it easy for the rest of today and you should NOT DRIVE or use heavy machinery until tomorrow (because of the sedation medicines used during the test).    FOLLOW UP: Our staff will call the number listed on  your records the next business day following your procedure to check on you and address any questions or concerns that you may have regarding the information given to you following your procedure. If we do not reach you, we will leave a message.  However, if you are feeling well and you are not experiencing any problems, there is no need to return our call.  We will assume that you have returned to your regular daily activities without incident.  If any biopsies were taken you will be contacted by phone or by letter within the next 1-3 weeks.  Please call us at [phone] if you have not heard about the biopsies in 3 weeks.   SIGNATURES/CONFIDENTIALITY: You and/or your care partner have signed paperwork which will be entered into your electronic medical record.  These signatures attest to the fact that that the information above on your After Visit Summary has been reviewed and is understood.  Full responsibility of the confidentiality of this discharge information lies with you and/or your care-partner.  Please read over handout about gastritis  [facility] please take 1 tablet 30 minutes before breakfast daily  Please call the office in the next day or two to set up a follow up office appointment for 6-8 weeks

## 2014-11-07 NOTE — Progress Notes (Signed)
A/ox3 pleased with MAC, report to Kristen RN 

## 2014-11-07 NOTE — Progress Notes (Signed)
Called to room to assist during endoscopic procedure.  Patient ID and intended procedure confirmed with present staff. Received instructions for my participation in the procedure from the performing physician.  

## 2014-11-08 ENCOUNTER — Telehealth: Payer: Self-pay | Admitting: *Deleted

## 2014-11-08 NOTE — Telephone Encounter (Signed)
  Follow up Call-  Call back number 11/07/2014  Post procedure Call Back phone  # [phone]  Permission to leave phone message Yes     Patient questions:  Do you have a fever, pain , or abdominal swelling? No. Pain Score  0 *  Have you tolerated food without any problems? Yes.    Have you been able to return to your normal activities? Yes.    Do you have any questions about your discharge instructions: Diet   No. Medications  No. Follow up visit  No.  Do you have questions or concerns about your Care? No.  Actions: * If pain score is 4 or above: No action needed, pain <4.

## 2014-11-11 ENCOUNTER — Other Ambulatory Visit: Payer: Self-pay | Admitting: Family Medicine

## 2014-11-13 ENCOUNTER — Encounter: Payer: Self-pay | Admitting: Internal Medicine

## 2014-11-29 ENCOUNTER — Telehealth: Payer: Self-pay | Admitting: Medical

## 2014-11-29 MED ORDER — LEVOTHYROXINE SODIUM 50 MCG PO TABS: 50.0000 ug | ORAL_TABLET | Freq: Every day | ORAL | Status: DC

## 2014-11-29 NOTE — Telephone Encounter (Signed)
Pt needs refills on thyroid medication, he has two left. Please send to [address]

## 2014-11-29 NOTE — Telephone Encounter (Signed)
Done

## 2014-12-06 ENCOUNTER — Ambulatory Visit (INDEPENDENT_AMBULATORY_CARE_PROVIDER_SITE_OTHER): Payer: 59 | Admitting: Family Medicine

## 2014-12-06 ENCOUNTER — Other Ambulatory Visit: Payer: Self-pay | Admitting: Family Medicine

## 2014-12-06 ENCOUNTER — Encounter: Payer: Self-pay | Admitting: Family Medicine

## 2014-12-06 VITALS — BP 114/80 | HR 86 | Ht 68.0 in | Wt 201.0 lb

## 2014-12-06 DIAGNOSIS — E038 Other specified hypothyroidism: Secondary | ICD-10-CM

## 2014-12-06 DIAGNOSIS — E119 Type 2 diabetes mellitus without complications: Secondary | ICD-10-CM | POA: Diagnosis not present

## 2014-12-06 DIAGNOSIS — K4090 Unilateral inguinal hernia, without obstruction or gangrene, not specified as recurrent: Secondary | ICD-10-CM | POA: Diagnosis not present

## 2014-12-06 DIAGNOSIS — E1169 Type 2 diabetes mellitus with other specified complication: Secondary | ICD-10-CM | POA: Diagnosis not present

## 2014-12-06 DIAGNOSIS — E669 Obesity, unspecified: Secondary | ICD-10-CM | POA: Diagnosis not present

## 2014-12-06 DIAGNOSIS — K2970 Gastritis, unspecified, without bleeding: Secondary | ICD-10-CM | POA: Diagnosis not present

## 2014-12-06 DIAGNOSIS — Z794 Long term (current) use of insulin: Secondary | ICD-10-CM

## 2014-12-06 DIAGNOSIS — E785 Hyperlipidemia, unspecified: Secondary | ICD-10-CM | POA: Diagnosis not present

## 2014-12-06 LAB — POCT GLYCOSYLATED HEMOGLOBIN (HGB A1C): Hemoglobin A1C: 7.3

## 2014-12-06 MED ORDER — LEVOTHYROXINE SODIUM 100 MCG PO TABS: 100.0000 ug | ORAL_TABLET | Freq: Every day | ORAL | Status: DC

## 2014-12-06 NOTE — Patient Instructions (Signed)
Take 2 of your thyroid pills until her gone and I will call in the higher strength for you

## 2014-12-06 NOTE — Progress Notes (Signed)
  Subjective:    Patient ID: Alejandro Griffin, male    DOB: 1981-04-17, 34 y.o.   MRN: [id number]  Alejandro Griffin is a 34 y.o. male who presents for follow-up of Type 2 diabetes mellitus.  Home blood sugar records: patient test BID Current symptoms/problems none Daily foot checks:yes   Any foot concerns:  none Exercise: none just working  Eyes: 07/26/2014 He has had difficulty recently with vomiting and was seen by GI. Endoscopy did show evidence of gastritis and he was placed on a proton pump inhibitor. He is doing well on this medication. His thyroid was lowered at that time to see if it had a GI affect. He is having no more difficulty with moving. The gastroenterologist did note a right inguinal hernia. The following portions of the patient's history were reviewed and updated as appropriate: allergies, current medications, past medical history, past social history and problem list.  ROS as in subjective above.     Objective:    Physical Exam Alert and in no distress.Right inguinal hernia is noted.  Height 5\' 8"  (1.727 m), weight 201 lb (91.173 kg).  Lab Review Diabetic Labs Latest Ref Rng 09/26/2014 07/19/2014 02/28/2014 10/26/2013 08/06/2013  HbA1c - - 7.3 6.7 8.0 8.9  Chol 0 - 200 mg/dL - 171 - - -  HDL >=40 mg/dL - 55 - - -  Calc LDL 0 - 99 mg/dL - 103(H) - - -  Triglycerides <150 mg/dL - 67 - - -  Creatinine 0.50 - 1.35 mg/dL 0.93 0.97 - - -   BP/Weight 12/06/2014 11/07/2014 10/25/2014 1/65/5374 11/18/7076  Systolic BP - 98 675 449 201  Diastolic BP - 61 78 78 70  Wt. (Lbs) 201 196 196.8 199 205  BMI 30.57 30.23 30.35 30.69 31.62   Foot/eye exam completion dates Latest Ref Rng 07/26/2014 07/19/2014  Eye Exam No Retinopathy No Retinopathy -  Foot Form Completion - - Done  Hemoglobin A1c is 7.3  Alejandro Griffin  reports that he has never smoked. He has never used smokeless tobacco. He reports that he does not drink alcohol or use illicit drugs.     Assessment & Plan:    Diabetes mellitus,  type II, insulin dependent - Plan: POCT glycosylated hemoglobin (Hb A1C)  Obesity (BMI 30-39.9)  Hyperlipidemia associated with type 2 diabetes mellitus  Right inguinal hernia - Plan: Ambulatory referral to General Surgery  Other specified hypothyroidism  Gastritis    1. Rx changes: levothyroxine 100 g 2. Education: Reviewed 'ABCs' of diabetes management (respective goals in parentheses):  A1C (<7), blood pressure (<130/80), and cholesterol (LDL <100). 3. Compliance at present is estimated to be good. Efforts to improve compliance (if necessary) will be directed at no change. 4. Follow up: 4 months He is to double up on the thyroid it he has at home and then switch to the 100 g. I will wait to check his TSH until he has been on this for several months. He had concerns over whether he needs to see an endocrinologist. I explained that his thyroid function and diabetes are easily handled here. Explained that we usually referred to endocrine if I run into a more complicated problem. He was comfortable with that.

## 2014-12-13 ENCOUNTER — Other Ambulatory Visit (INDEPENDENT_AMBULATORY_CARE_PROVIDER_SITE_OTHER): Payer: 59

## 2014-12-13 ENCOUNTER — Ambulatory Visit (INDEPENDENT_AMBULATORY_CARE_PROVIDER_SITE_OTHER): Payer: 59 | Admitting: Physician Assistant

## 2014-12-13 ENCOUNTER — Encounter: Payer: Self-pay | Admitting: Physician Assistant

## 2014-12-13 VITALS — BP 100/70 | HR 88 | Ht 67.5 in | Wt 200.5 lb

## 2014-12-13 DIAGNOSIS — R112 Nausea with vomiting, unspecified: Secondary | ICD-10-CM | POA: Diagnosis not present

## 2014-12-13 DIAGNOSIS — E039 Hypothyroidism, unspecified: Secondary | ICD-10-CM | POA: Diagnosis not present

## 2014-12-13 DIAGNOSIS — K2970 Gastritis, unspecified, without bleeding: Secondary | ICD-10-CM | POA: Diagnosis not present

## 2014-12-13 LAB — TSH: TSH: 27.65 u[IU]/mL — ABNORMAL HIGH (ref 0.35–4.50)

## 2014-12-13 MED ORDER — PANTOPRAZOLE SODIUM 40 MG PO TBEC: DELAYED_RELEASE_TABLET | ORAL | Status: DC

## 2014-12-13 NOTE — Progress Notes (Addendum)
Patient ID: Alejandro Griffin, male   DOB: June 13, 1980, 34 y.o.   MRN: [id number]   Subjective:    Patient ID: Alejandro Griffin, male    DOB: 1981-03-04, 34 y.o.   MRN: [id number]  HPI Alejandro Griffin is a 34 year old white male recently known to Dr. Hilarie Fredrickson. Been seen earlier this summer with complaints of intermittent episodes of vomiting without nausea. He is an insulin-dependent diabetic and had recently been diagnosed with hypothyroidism. He had undergone an EGD on 11/07/2014 per Dr. Hilarie Fredrickson which showed acute gastritis. Biopsies showed reactive gastropathy no evidence of H. pylori. He was treated with Protonix 40 mg by mouth every morning. He comes back in today for follow-up. He says he is actually been doing very well and neither he or his wife can't remember the last time that he had an episode of vomiting. They both think it was just prior to the endoscopy. He says his appetite is fine his weight has been stable is no complaints of abdominal discomfort or nausea. They are both concerned because his vomiting episodes started around the same time that his dose of levothyroxin was increased. He was seen a week or so ago by Dr. Redmond School and was asked to double up on his dose of levothyroxin. He is afraid to do this fearing that this will bring on the vomiting. His last TSH was done about 2 months ago and was 33, at initial diagnosis TSH was over 80. They also have questions about Protonix and how long he should remain on this medication.  Review of Systems Pertinent positive and negative review of systems were noted in the above HPI section.  All other review of systems was otherwise negative.  Outpatient Encounter Prescriptions as of 12/13/2014  Medication Sig  . atorvastatin (LIPITOR) 20 MG tablet Take 1 tablet (20 mg total) by mouth daily.  . Blood Glucose Monitoring Suppl (ONE TOUCH ULTRA SYSTEM KIT) W/DEVICE KIT 1 kit by Does not apply route once.  Marland Kitchen glucose blood (ONE TOUCH TEST STRIPS) test strip Test  sugars 2 hours after meals daily  . Insulin Glargine (LANTUS SOLOSTAR) 100 UNIT/ML Solostar Pen INJECT 18 UNITS INTO THE SKIN AT BEDTIME. (Patient taking differently: Inject 14 Units into the skin daily at 10 pm. INJECT 14UNITS INTO THE SKIN AT BEDTIME.)  . Insulin Pen Needle 29G X 8MM MISC 1 each by Other route daily. USE AS DIRECTED TO BE USED WITH INSULIN PEN  . INVOKANA 100 MG TABS tablet TAKE 1 TABLET (100 MG TOTAL) BY MOUTH DAILY.  Marland Kitchen Lancets (ONETOUCH ULTRASOFT) lancets Use as instructed  . levothyroxine (SYNTHROID, LEVOTHROID) 100 MCG tablet Take 1 tablet (100 mcg total) by mouth daily.  Marland Kitchen lisinopril (PRINIVIL,ZESTRIL) 5 MG tablet Take 1 tablet (5 mg total) by mouth daily.  . ONE TOUCH ULTRA TEST test strip USE AS INSTRUCTED  . pantoprazole (PROTONIX) 40 MG tablet Take 1 tablet 30 minutes before breakfast daily  . pioglitazone-metformin (ACTOPLUS MET) 15-850 MG per tablet TAKE 1 TABLET BY MOUTH TWICE A DAY WITH A MEAL  . [DISCONTINUED] pantoprazole (PROTONIX) 40 MG tablet Take 1 tablet 30 minutes before breakfast daily   No facility-administered encounter medications on file as of 12/13/2014.   No Known Allergies Patient Active Problem List   Diagnosis Date Noted  . Hypothyroid 08/26/2014  . Low testosterone 08/26/2014  . Other fatigue 08/26/2014  . Abnormal DTR (deep tendon reflex) 08/26/2014  . Vitamin D deficiency 08/26/2014  . Hyperlipidemia associated with type 2 diabetes mellitus 02/28/2014  .  Obesity (BMI 30-39.9) 06/27/2013  . Diabetes mellitus, type II, insulin dependent 01/21/2012   Social History   Social History  . Marital Status: Married    Spouse Name: N/A  . Number of Children: 1  . Years of Education: N/A   Occupational History  . Electrician    Social History Main Topics  . Smoking status: Never Smoker   . Smokeless tobacco: Never Used  . Alcohol Use: No  . Drug Use: No  . Sexual Activity: Yes   Other Topics Concern  . Not on file   Social History  Narrative    Mr. Casique family history includes Cancer in his paternal grandfather; Diabetes in his maternal grandmother; Hypertension in his mother.      Objective:    Filed Vitals:   12/13/14 1328  BP: 100/70  Pulse: 88    Physical Exam  well-developed healthy-appearing white male in no acute distress blood pressure 100/70 pulse 88 height 5 foot 7 weight 200 accompanied by his wife. Not further examined today discussion only       Assessment & Plan:   #1 34 yo male with hx of recurrent episodes of vomiting- acute gastritis on EGD , and totally asymptomatic on Protonix #2 significant hypothyroidism #3 IDDM  Plan; Pt will remain on Protonix at least 3 more months while thyroid  meds are being adjusted . I told them I am not at all sure that the levothyroxine had anything to do with his vomiting . Check TSH today .pt would like to know his level before doubling dose, but will follow Dr Lanice Shirts advice   Amy Genia Harold PA-C 12/13/2014   Cc: Denita Lung, MD   Addendum: Reviewed and agree with ongoing management. Certainly defer thyroid replacement management to Dr. Jearld Pies, MD

## 2014-12-13 NOTE — Patient Instructions (Signed)
Please go to the basement level to have your labs drawn.  We [facility] [address] for Pantoprazole Sodium 40 mg.

## 2014-12-18 ENCOUNTER — Ambulatory Visit: Payer: 59 | Admitting: Physician Assistant

## 2014-12-20 ENCOUNTER — Ambulatory Visit: Payer: Self-pay | Admitting: Surgery

## 2014-12-20 NOTE — H&P (Signed)
History of Present Illness Alejandro Griffin. Alejandro Albany MD; 12/20/2014 4:31 PM) Patient words: hernia.  The patient is a 34 year old male who presents with an inguinal hernia. Referred by Dr. Jill Alexanders for right inguinal hernia This is a 34 yo male with multiple medical problems who presents with a two month history of a visible bulge in his right groin. This area remains reducible. It has become larger and occasionally becomes quite tender. He denies any obstructive symptoms. He was examined by Dr. Redmond School who felt that he had a reducible right inguinal hernia. He is now referred for surgical evaluation. Other Problems Davy Pique Bynum, CMA; 12/20/2014 11:06 AM) Diabetes Mellitus Inguinal Hernia Thyroid Disease  Past Surgical History Marjean Donna, CMA; 12/20/2014 11:06 AM) No pertinent past surgical history  Diagnostic Studies History Marjean Donna, CMA; 12/20/2014 11:06 AM) Colonoscopy never  Allergies (Sonya Bynum, CMA; 12/20/2014 11:07 AM) No Known Drug Allergies 12/20/2014  Medication History (Sonya Bynum, CMA; 12/20/2014 11:07 AM) Atorvastatin Calcium (20MG  Tablet, Oral) Active. Invokana (100MG  Tablet, Oral) Active. Lantus SoloStar (100UNIT/ML Soln Pen-inj, Subcutaneous) Active. Levothyroxine Sodium (50MCG Tablet, Oral) Active. Lisinopril (5MG  Tablet, Oral) Active. OneTouch Ultra Blue (In Vitro) Active. Pioglitazone HCl-Metformin HCl (15-850MG  Tablet, Oral) Active. Pantoprazole Sodium (40MG  Tablet DR, Oral) Active. Synthroid (75MCG Tablet, Oral) Active. Vitamin D (Ergocalciferol) (50000UNIT Capsule, Oral) Active. Medications Reconciled  Social History Marjean Donna, CMA; 12/20/2014 11:06 AM) Caffeine use Carbonated beverages, Tea. No alcohol use No drug use Tobacco use Never smoker.  Family History Marjean Donna, CMA; 12/20/2014 11:06 AM) Diabetes Mellitus Family Members In General, Mother. Heart Disease Father. Hypertension Father. Thyroid problems Family Members In  General, Mother.     Review of Systems Davy Pique Bynum CMA; 12/20/2014 11:06 AM) General Not Present- Appetite Loss, Chills, Fatigue, Fever, Night Sweats, Weight Gain and Weight Loss. Skin Not Present- Change in Wart/Mole, Dryness, Hives, Jaundice, New Lesions, Non-Healing Wounds, Rash and Ulcer. HEENT Not Present- Earache, Hearing Loss, Hoarseness, Nose Bleed, Oral Ulcers, Ringing in the Ears, Seasonal Allergies, Sinus Pain, Sore Throat, Visual Disturbances, Wears glasses/contact lenses and Yellow Eyes. Respiratory Not Present- Bloody sputum, Chronic Cough, Difficulty Breathing, Snoring and Wheezing. Breast Not Present- Breast Mass, Breast Pain, Nipple Discharge and Skin Changes. Cardiovascular Not Present- Chest Pain, Difficulty Breathing Lying Down, Leg Cramps, Palpitations, Rapid Heart Rate, Shortness of Breath and Swelling of Extremities. Gastrointestinal Not Present- Abdominal Pain, Bloating, Bloody Stool, Change in Bowel Habits, Chronic diarrhea, Constipation, Difficulty Swallowing, Excessive gas, Gets full quickly at meals, Hemorrhoids, Indigestion, Nausea, Rectal Pain and Vomiting. Male Genitourinary Not Present- Blood in Urine, Change in Urinary Stream, Frequency, Impotence, Nocturia, Painful Urination, Urgency and Urine Leakage. Musculoskeletal Not Present- Back Pain, Joint Pain, Joint Stiffness, Muscle Pain, Muscle Weakness and Swelling of Extremities. Neurological Not Present- Decreased Memory, Fainting, Headaches, Numbness, Seizures, Tingling, Tremor, Trouble walking and Weakness. Psychiatric Not Present- Anxiety, Bipolar, Change in Sleep Pattern, Depression, Fearful and Frequent crying. Endocrine Not Present- Cold Intolerance, Excessive Hunger, Hair Changes, Heat Intolerance, Hot flashes and New Diabetes. Hematology Not Present- Easy Bruising, Excessive bleeding, Gland problems, HIV and Persistent Infections.  Vitals (Sonya Bynum CMA; 12/20/2014 11:06 AM) 12/20/2014 11:06 AM Weight: 198  lb Height: 67in Body Surface Area: 2.06 m Body Mass Index: 31.01 kg/m Temp.: 92F(Temporal)  Pulse: 77 (Regular)  BP: 128/80 (Sitting, Left Arm, Standard)     Physical Exam Rodman Key K. Octavian Godek MD; 12/20/2014 4:31 PM)  The physical exam findings are as follows: Note:WDWN in NAD HEENT: EOMI, sclera anicteric Neck: No masses, no  thyromegaly Lungs: CTA bilaterally; normal respiratory effort CV: Regular rate and rhythm; no murmurs Abd: +bowel sounds, soft, non-tender, no masses GU: bilateral descended testes; no testicular masses Visible reducible right inguinal hernia; no sign of left inguinal hernia Ext: Well-perfused; no edema Skin: Warm, dry; no sign of jaundice    Assessment & Plan Rodman Key K. Winslow Ederer MD; 12/20/2014 11:35 AM)  REDUCIBLE RIGHT INGUINAL HERNIA (550.90  K40.90)  Current Plans Schedule for Surgery - right inguinal hernia repair with mesh. The surgical procedure has been discussed with the patient. Potential risks, benefits, alternative treatments, and expected outcomes have been explained. All of the patient's questions at this time have been answered. The likelihood of reaching the patient's treatment goal is good. The patient understand the proposed surgical procedure and wishes to proceed. Pt Education - Pamphlet Given - Hernia Surgery: discussed with patient and provided information.  Alejandro Griffin. Georgette Dover, MD, [facility]  General/ Trauma Surgery  12/20/2014 4:32 PM

## 2015-02-13 NOTE — Patient Instructions (Addendum)
YOUR PROCEDURE IS SCHEDULED ON :  02/20/15  REPORT TO [facility] MAIN ENTRANCE FOLLOW SIGNS TO EAST ELEVATOR - GO TO 3rd FLOOR CHECK IN AT 3 EAST NURSES STATION (SHORT STAY) AT:  5:30 AM  CALL THIS NUMBER IF YOU HAVE PROBLEMS THE MORNING OF SURGERY [phone]  REMEMBER:ONLY 1 PER PERSON MAY GO TO SHORT STAY WITH YOU TO GET READY THE MORNING OF YOUR SURGERY  DO NOT EAT FOOD OR DRINK LIQUIDS AFTER MIDNIGHT  TAKE THESE MEDICINES THE MORNING OF SURGERY: LIPITOR / LEVOTHYROXINE / PANTOPRAZOLE  STOP ASPIRIN / IBUPROFEN / ALEVE / VITAMINS / HERBAL MEDS __5__ DAYS BEFORE SURGERY  YOU MAY NOT HAVE ANY METAL ON YOUR BODY INCLUDING HAIR PINS AND PIERCING'S. DO NOT WEAR JEWELRY, MAKEUP, LOTIONS, POWDERS OR PERFUMES. DO NOT WEAR NAIL POLISH. DO NOT SHAVE 48 HRS PRIOR TO SURGERY. MEN MAY SHAVE FACE AND NECK.  DO NOT [facility]. [facility] IS NOT RESPONSIBLE FOR VALUABLES.  CONTACTS, DENTURES OR PARTIALS MAY NOT BE WORN TO SURGERY. LEAVE SUITCASE IN CAR. CAN BE BROUGHT TO ROOM AFTER SURGERY.  PATIENTS DISCHARGED THE DAY OF SURGERY WILL NOT BE ALLOWED TO DRIVE HOME.  PLEASE READ OVER THE FOLLOWING INSTRUCTION SHEETS _________________________________________________________________________________                                          [facility] - PREPARING FOR SURGERY  Before surgery, you can play an important role.  Because skin is not sterile, your skin needs to be as free of germs as possible.  You can reduce the number of germs on your skin by washing with CHG (chlorahexidine gluconate) soap before surgery.  CHG is an antiseptic cleaner which kills germs and bonds with the skin to continue killing germs even after washing. Please DO NOT use if you have an allergy to CHG or antibacterial soaps.  If your skin becomes reddened/irritated stop using the CHG and inform your nurse when you arrive at Short Stay. Do not shave (including legs and underarms) for  at least 48 hours prior to the first CHG shower.  You may shave your face. Please follow these instructions carefully:   1.  Shower with CHG Soap the night before surgery and the  morning of Surgery.   2.  If you choose to wash your hair, wash your hair first as usual with your  normal  Shampoo.   3.  After you shampoo, rinse your hair and body thoroughly to remove the  shampoo.                                         4.  Use CHG as you would any other liquid soap.  You can apply chg directly  to the skin and wash . Gently wash with scrungie or clean wascloth    5.  Apply the CHG Soap to your body ONLY FROM THE NECK DOWN.   Do not use on open                           Wound or open sores. Avoid contact with eyes, ears mouth and genitals (private parts).  Genitals (private parts) with your normal soap.              6.  Wash thoroughly, paying special attention to the area where your surgery  will be performed.   7.  Thoroughly rinse your body with warm water from the neck down.   8.  DO NOT shower/wash with your normal soap after using and rinsing off  the CHG Soap .                9.  Pat yourself dry with a clean towel.             10.  Wear clean night clothes to bed after shower             11.  Place clean sheets on your bed the night of your first shower and do not  sleep with pets.  Day of Surgery : Do not apply any lotions/deodorants the morning of surgery.  Please wear clean clothes to the hospital/surgery center.  FAILURE TO FOLLOW THESE INSTRUCTIONS MAY RESULT IN THE CANCELLATION OF YOUR SURGERY    PATIENT SIGNATURE_________________________________  ______________________________________________________________________

## 2015-02-14 ENCOUNTER — Encounter (HOSPITAL_COMMUNITY)
Admission: RE | Admit: 2015-02-14 | Discharge: 2015-02-14 | Disposition: A | Discharge status: Home / Self Care | Payer: 59 | Source: Ambulatory Visit | Attending: Surgery | Admitting: Surgery

## 2015-02-14 ENCOUNTER — Encounter (HOSPITAL_COMMUNITY): Payer: Self-pay

## 2015-02-14 DIAGNOSIS — Z01818 Encounter for other preprocedural examination: Secondary | ICD-10-CM | POA: Diagnosis present

## 2015-02-14 DIAGNOSIS — K4090 Unilateral inguinal hernia, without obstruction or gangrene, not specified as recurrent: Secondary | ICD-10-CM | POA: Insufficient documentation

## 2015-02-14 HISTORY — DX: Unilateral inguinal hernia, without obstruction or gangrene, not specified as recurrent: K40.90

## 2015-02-14 LAB — BASIC METABOLIC PANEL
Anion gap: 7 (ref 5–15)
BUN: 19 mg/dL (ref 6–20)
CO2: 29 mmol/L (ref 22–32)
Calcium: 9.5 mg/dL (ref 8.9–10.3)
Chloride: 103 mmol/L (ref 101–111)
Creatinine, Ser: 1.15 mg/dL (ref 0.61–1.24)
GFR calc Af Amer: >60 mL/min (ref >60)
GFR calc non Af Amer: >60 mL/min (ref >60)
Glucose, Bld: 167 mg/dL — ABNORMAL HIGH (ref 65–99)
Potassium: 5 mmol/L (ref 3.5–5.1)
Sodium: 139 mmol/L (ref 135–145)

## 2015-02-14 LAB — CBC
HCT: 45.4 % (ref 39.0–52.0)
Hemoglobin: 14.9 g/dL (ref 13.0–17.0)
MCH: 29.6 pg (ref 26.0–34.0)
MCHC: 32.8 g/dL (ref 30.0–36.0)
MCV: 90.1 fL (ref 78.0–100.0)
Platelets: 324 10*3/uL (ref 150–400)
RBC: 5.04 MIL/uL (ref 4.22–5.81)
RDW: 13.5 % (ref 11.5–15.5)
WBC: 7.9 10*3/uL (ref 4.0–10.5)

## 2015-02-20 ENCOUNTER — Ambulatory Visit (HOSPITAL_COMMUNITY): Payer: 59 | Admitting: Anesthesiology

## 2015-02-20 ENCOUNTER — Ambulatory Visit (HOSPITAL_COMMUNITY)
Admission: RE | Admit: 2015-02-20 | Discharge: 2015-02-20 | Disposition: A | Discharge status: Home / Self Care | Payer: 59 | Source: Ambulatory Visit | Attending: Surgery | Admitting: Surgery

## 2015-02-20 ENCOUNTER — Encounter (HOSPITAL_COMMUNITY)
Admission: RE | Disposition: A | Discharge status: Home / Self Care | Payer: Self-pay | Source: Ambulatory Visit | Attending: Surgery

## 2015-02-20 ENCOUNTER — Encounter (HOSPITAL_COMMUNITY): Payer: Self-pay | Admitting: *Deleted

## 2015-02-20 DIAGNOSIS — K4090 Unilateral inguinal hernia, without obstruction or gangrene, not specified as recurrent: Secondary | ICD-10-CM | POA: Diagnosis present

## 2015-02-20 DIAGNOSIS — Z79899 Other long term (current) drug therapy: Secondary | ICD-10-CM | POA: Diagnosis not present

## 2015-02-20 DIAGNOSIS — K219 Gastro-esophageal reflux disease without esophagitis: Secondary | ICD-10-CM | POA: Diagnosis not present

## 2015-02-20 DIAGNOSIS — Z7984 Long term (current) use of oral hypoglycemic drugs: Secondary | ICD-10-CM | POA: Insufficient documentation

## 2015-02-20 DIAGNOSIS — D176 Benign lipomatous neoplasm of spermatic cord: Secondary | ICD-10-CM | POA: Diagnosis not present

## 2015-02-20 DIAGNOSIS — E039 Hypothyroidism, unspecified: Secondary | ICD-10-CM | POA: Insufficient documentation

## 2015-02-20 DIAGNOSIS — E119 Type 2 diabetes mellitus without complications: Secondary | ICD-10-CM | POA: Insufficient documentation

## 2015-02-20 DIAGNOSIS — I10 Essential (primary) hypertension: Secondary | ICD-10-CM | POA: Insufficient documentation

## 2015-02-20 DIAGNOSIS — Z794 Long term (current) use of insulin: Secondary | ICD-10-CM | POA: Diagnosis not present

## 2015-02-20 DIAGNOSIS — Z6831 Body mass index (BMI) 31.0-31.9, adult: Secondary | ICD-10-CM | POA: Insufficient documentation

## 2015-02-20 HISTORY — PX: INSERTION OF MESH: SHX5868

## 2015-02-20 HISTORY — PX: INGUINAL HERNIA REPAIR: SHX194

## 2015-02-20 LAB — GLUCOSE, CAPILLARY: Glucose-Capillary: 150 mg/dL — ABNORMAL HIGH (ref 65–99)

## 2015-02-20 SURGERY — REPAIR, HERNIA, INGUINAL, ADULT
Anesthesia: General | Laterality: Right

## 2015-02-20 MED ORDER — FENTANYL CITRATE (PF) 100 MCG/2ML IJ SOLN
INTRAMUSCULAR | Status: DC | PRN
  Administered 2015-02-20: 100 ug via INTRAVENOUS
  Administered 2015-02-20 (×2): 50 ug via INTRAVENOUS

## 2015-02-20 MED ORDER — MIDAZOLAM HCL 5 MG/5ML IJ SOLN
INTRAMUSCULAR | Status: DC | PRN
  Administered 2015-02-20: 2 mg via INTRAVENOUS

## 2015-02-20 MED ORDER — KETOROLAC TROMETHAMINE 30 MG/ML IJ SOLN
INTRAMUSCULAR | Status: DC | PRN
  Administered 2015-02-20: 30 mg via INTRAVENOUS

## 2015-02-20 MED ORDER — HYDROMORPHONE HCL 1 MG/ML IJ SOLN
INTRAMUSCULAR | Status: AC
  Filled 2015-02-20: qty 1

## 2015-02-20 MED ORDER — NEOSTIGMINE METHYLSULFATE 10 MG/10ML IV SOLN
INTRAVENOUS | Status: DC | PRN
  Administered 2015-02-20: 4 mg via INTRAVENOUS

## 2015-02-20 MED ORDER — MORPHINE SULFATE (PF) 10 MG/ML IV SOLN: 2.0000 mg | INTRAVENOUS | Status: DC | PRN

## 2015-02-20 MED ORDER — PROPOFOL 10 MG/ML IV BOLUS
INTRAVENOUS | Status: AC
  Filled 2015-02-20: qty 20

## 2015-02-20 MED ORDER — BUPIVACAINE-EPINEPHRINE 0.25% -1:200000 IJ SOLN
INTRAMUSCULAR | Status: DC | PRN
  Administered 2015-02-20: 10 mL

## 2015-02-20 MED ORDER — ONDANSETRON HCL 4 MG/2ML IJ SOLN
INTRAMUSCULAR | Status: AC
  Filled 2015-02-20: qty 2

## 2015-02-20 MED ORDER — PROPOFOL 10 MG/ML IV BOLUS
INTRAVENOUS | Status: DC | PRN
  Administered 2015-02-20: 200 mg via INTRAVENOUS

## 2015-02-20 MED ORDER — CHLORHEXIDINE GLUCONATE 4 % EX LIQD: 1.0000 "application " | Freq: Once | CUTANEOUS | Status: DC

## 2015-02-20 MED ORDER — ONDANSETRON HCL 4 MG/2ML IJ SOLN: 4.0000 mg | INTRAMUSCULAR | Status: DC | PRN

## 2015-02-20 MED ORDER — OXYCODONE-ACETAMINOPHEN 5-325 MG PO TABS
1.0000 | ORAL_TABLET | ORAL | Status: DC | PRN
  Administered 2015-02-20: 1 via ORAL
  Filled 2015-02-20: qty 1

## 2015-02-20 MED ORDER — SODIUM CHLORIDE 0.9 % IJ SOLN
INTRAMUSCULAR | Status: AC
  Filled 2015-02-20: qty 10

## 2015-02-20 MED ORDER — CEFAZOLIN SODIUM-DEXTROSE 2-3 GM-% IV SOLR
2.0000 g | INTRAVENOUS | Status: AC
  Administered 2015-02-20: 2 g via INTRAVENOUS

## 2015-02-20 MED ORDER — LACTATED RINGERS IV SOLN
INTRAVENOUS | Status: DC | PRN
  Administered 2015-02-20: 07:00:00 via INTRAVENOUS

## 2015-02-20 MED ORDER — LIDOCAINE HCL (CARDIAC) 20 MG/ML IV SOLN
INTRAVENOUS | Status: AC
  Filled 2015-02-20: qty 5

## 2015-02-20 MED ORDER — EPHEDRINE SULFATE 50 MG/ML IJ SOLN
INTRAMUSCULAR | Status: AC
  Filled 2015-02-20: qty 1

## 2015-02-20 MED ORDER — MIDAZOLAM HCL 2 MG/2ML IJ SOLN: 0.5000 mg | Freq: Once | INTRAMUSCULAR | Status: DC | PRN

## 2015-02-20 MED ORDER — 0.9 % SODIUM CHLORIDE (POUR BTL) OPTIME
TOPICAL | Status: DC | PRN
  Administered 2015-02-20: 1000 mL

## 2015-02-20 MED ORDER — PROMETHAZINE HCL 25 MG/ML IJ SOLN: 6.2500 mg | INTRAMUSCULAR | Status: DC | PRN

## 2015-02-20 MED ORDER — GLYCOPYRROLATE 0.2 MG/ML IJ SOLN
INTRAMUSCULAR | Status: DC | PRN
  Administered 2015-02-20: 0.6 mg via INTRAVENOUS

## 2015-02-20 MED ORDER — OXYCODONE-ACETAMINOPHEN 5-325 MG PO TABS: 1.0000 | ORAL_TABLET | ORAL | Status: DC | PRN

## 2015-02-20 MED ORDER — ROCURONIUM BROMIDE 100 MG/10ML IV SOLN
INTRAVENOUS | Status: DC | PRN
  Administered 2015-02-20: 40 mg via INTRAVENOUS
  Administered 2015-02-20: 10 mg via INTRAVENOUS

## 2015-02-20 MED ORDER — LIDOCAINE HCL (CARDIAC) 20 MG/ML IV SOLN: INTRAVENOUS | Status: DC | PRN

## 2015-02-20 MED ORDER — MEPERIDINE HCL 50 MG/ML IJ SOLN: 6.2500 mg | INTRAMUSCULAR | Status: DC | PRN

## 2015-02-20 MED ORDER — ROCURONIUM BROMIDE 100 MG/10ML IV SOLN
INTRAVENOUS | Status: AC
Start: 2015-02-20 — End: 2015-02-20
  Filled 2015-02-20: qty 1

## 2015-02-20 MED ORDER — MIDAZOLAM HCL 2 MG/2ML IJ SOLN
INTRAMUSCULAR | Status: AC
  Filled 2015-02-20: qty 4

## 2015-02-20 MED ORDER — ONDANSETRON HCL 4 MG/2ML IJ SOLN
INTRAMUSCULAR | Status: DC | PRN
  Administered 2015-02-20: 4 mg via INTRAVENOUS

## 2015-02-20 MED ORDER — BUPIVACAINE-EPINEPHRINE 0.25% -1:200000 IJ SOLN
INTRAMUSCULAR | Status: AC
  Filled 2015-02-20: qty 1

## 2015-02-20 MED ORDER — HYDROMORPHONE HCL 1 MG/ML IJ SOLN
0.2500 mg | INTRAMUSCULAR | Status: DC | PRN
  Administered 2015-02-20: 0.5 mg via INTRAVENOUS

## 2015-02-20 MED ORDER — CEFAZOLIN SODIUM-DEXTROSE 2-3 GM-% IV SOLR
INTRAVENOUS | Status: AC
Start: 2015-02-20 — End: 2015-02-20
  Filled 2015-02-20: qty 50

## 2015-02-20 MED ORDER — FENTANYL CITRATE (PF) 250 MCG/5ML IJ SOLN
INTRAMUSCULAR | Status: AC
  Filled 2015-02-20: qty 25

## 2015-02-20 MED ORDER — LIDOCAINE HCL (CARDIAC) 20 MG/ML IV SOLN
INTRAVENOUS | Status: DC | PRN
  Administered 2015-02-20: 20 mg via INTRAVENOUS

## 2015-02-20 SURGICAL SUPPLY — 38 items
BENZOIN TINCTURE PRP APPL 2/3 (GAUZE/BANDAGES/DRESSINGS) ×2 IMPLANT
BLADE HEX COATED 2.75 (ELECTRODE) ×2 IMPLANT
CHLORAPREP W/TINT 26ML (MISCELLANEOUS) ×2 IMPLANT
COVER SURGICAL LIGHT HANDLE (MISCELLANEOUS) ×2 IMPLANT
DECANTER SPIKE VIAL GLASS SM (MISCELLANEOUS) ×2 IMPLANT
DISSECTOR ROUND CHERRY 3/8 STR (MISCELLANEOUS) ×2 IMPLANT
DRAIN PENROSE 18X1/2 LTX STRL (DRAIN) ×2 IMPLANT
DRAPE LAPAROTOMY TRNSV 102X78 (DRAPE) ×2 IMPLANT
DRAPE UTILITY XL STRL (DRAPES) ×2 IMPLANT
DRSG TEGADERM 4X4.75 (GAUZE/BANDAGES/DRESSINGS) ×2 IMPLANT
ELECT REM PT RETURN 9FT ADLT (ELECTROSURGICAL) ×2
ELECTRODE REM PT RTRN 9FT ADLT (ELECTROSURGICAL) ×1 IMPLANT
GAUZE SPONGE 4X4 12PLY STRL (GAUZE/BANDAGES/DRESSINGS) ×2 IMPLANT
GAUZE SPONGE 4X4 16PLY XRAY LF (GAUZE/BANDAGES/DRESSINGS) IMPLANT
GLOVE BIO SURGEON STRL SZ7 (GLOVE) ×2 IMPLANT
GLOVE BIOGEL PI IND STRL 7.5 (GLOVE) ×1 IMPLANT
GLOVE BIOGEL PI INDICATOR 7.5 (GLOVE) ×1
GOWN STRL REUS W/TWL XL LVL3 (GOWN DISPOSABLE) ×2 IMPLANT
KIT BASIN OR (CUSTOM PROCEDURE TRAY) ×2 IMPLANT
MESH PARIETEX PROGRIP RIGHT (Mesh General) ×2 IMPLANT
NEEDLE HYPO 25X1 1.5 SAFETY (NEEDLE) ×2 IMPLANT
PACK GENERAL/GYN (CUSTOM PROCEDURE TRAY) ×2 IMPLANT
STRIP CLOSURE SKIN 1/2X4 (GAUZE/BANDAGES/DRESSINGS) ×2 IMPLANT
SUT MNCRL AB 4-0 PS2 18 (SUTURE) ×2 IMPLANT
SUT PROLENE 2 0 SH DA (SUTURE) IMPLANT
SUT SILK 3 0 (SUTURE)
SUT SILK 3-0 18XBRD TIE 12 (SUTURE) IMPLANT
SUT VIC AB 0 UR5 27 (SUTURE) ×2 IMPLANT
SUT VIC AB 2-0 CT2 27 (SUTURE) IMPLANT
SUT VIC AB 2-0 SH 27 (SUTURE) ×1
SUT VIC AB 2-0 SH 27X BRD (SUTURE) ×1 IMPLANT
SUT VIC AB 3-0 SH 27 (SUTURE) ×1
SUT VIC AB 3-0 SH 27X BRD (SUTURE) ×1 IMPLANT
SUT VICRYL 0 27 CT2 27 ABS (SUTURE) IMPLANT
SUT VICRYL 0 UR6 27IN ABS (SUTURE) IMPLANT
SYR 20CC LL (SYRINGE) ×2 IMPLANT
TOWEL OR 17X26 10 PK STRL BLUE (TOWEL DISPOSABLE) ×2 IMPLANT
TOWEL OR NON WOVEN STRL DISP B (DISPOSABLE) ×2 IMPLANT

## 2015-02-20 NOTE — H&P (Signed)
History of Present Illness  Patient words: Alejandro Griffin.   Referred by Dr. Jill Alexanders for right inguinal Alejandro Griffin  This is a 34 yo male with multiple medical problems who presents with a two month history of a visible bulge in his right groin. This area remains reducible. It has become larger and occasionally becomes quite tender. He denies any obstructive symptoms. He was examined by Dr. Redmond School who felt that he had a reducible right inguinal Alejandro Griffin. He is now referred for surgical evaluation. Other Problems  Diabetes Mellitus Inguinal Alejandro Griffin Thyroid Disease  Past Surgical History No pertinent past surgical history  Diagnostic Studies History Colonoscopy never  Allergies  No Known Drug Allergies 12/20/2014  Medication History  Atorvastatin Calcium (20MG  Tablet, Oral) Active. Invokana (100MG  Tablet, Oral) Active. Lantus SoloStar (100UNIT/ML Soln Pen-inj, Subcutaneous) Active. Levothyroxine Sodium (50MCG Tablet, Oral) Active. Lisinopril (5MG  Tablet, Oral) Active. OneTouch Ultra Blue (In Vitro) Active. Pioglitazone HCl-Metformin HCl (15-850MG  Tablet, Oral) Active. Pantoprazole Sodium (40MG  Tablet DR, Oral) Active. Synthroid (75MCG Tablet, Oral) Active. Vitamin D (Ergocalciferol) (50000UNIT Capsule, Oral) Active. Medications Reconciled  Social History  Caffeine use Carbonated beverages, Tea. No alcohol use No drug use Tobacco use Never smoker.  Family History  Diabetes Mellitus Family Members In General, Mother. Heart Disease Father. Hypertension Father. Thyroid problems Family Members In General, Mother.     Review of Systems  General Not Present- Appetite Loss, Chills, Fatigue, Fever, Night Sweats, Weight Gain and Weight Loss. Skin Not Present- Change in Wart/Mole, Dryness, Hives, Jaundice, New Lesions, Non-Healing Wounds, Rash and Ulcer. HEENT Not Present- Earache, Hearing Loss, Hoarseness, Nose Bleed, Oral Ulcers, Ringing in the Ears,  Seasonal Allergies, Sinus Pain, Sore Throat, Visual Disturbances, Wears glasses/contact lenses and Yellow Eyes. Respiratory Not Present- Bloody sputum, Chronic Cough, Difficulty Breathing, Snoring and Wheezing. Breast Not Present- Breast Mass, Breast Pain, Nipple Discharge and Skin Changes. Cardiovascular Not Present- Chest Pain, Difficulty Breathing Lying Down, Leg Cramps, Palpitations, Rapid Heart Rate, Shortness of Breath and Swelling of Extremities. Gastrointestinal Not Present- Abdominal Pain, Bloating, Bloody Stool, Change in Bowel Habits, Chronic diarrhea, Constipation, Difficulty Swallowing, Excessive gas, Gets full quickly at meals, Hemorrhoids, Indigestion, Nausea, Rectal Pain and Vomiting. Male Genitourinary Not Present- Blood in Urine, Change in Urinary Stream, Frequency, Impotence, Nocturia, Painful Urination, Urgency and Urine Leakage. Musculoskeletal Not Present- Back Pain, Joint Pain, Joint Stiffness, Muscle Pain, Muscle Weakness and Swelling of Extremities. Neurological Not Present- Decreased Memory, Fainting, Headaches, Numbness, Seizures, Tingling, Tremor, Trouble walking and Weakness. Psychiatric Not Present- Anxiety, Bipolar, Change in Sleep Pattern, Depression, Fearful and Frequent crying. Endocrine Not Present- Cold Intolerance, Excessive Hunger, Hair Changes, Heat Intolerance, Hot flashes and New Diabetes. Hematology Not Present- Easy Bruising, Excessive bleeding, Gland problems, HIV and Persistent Infections.  Vitals  Weight: 198 lb Height: 67in Body Surface Area: 2.06 m Body Mass Index: 31.01 kg/m Temp.: 19F(Temporal)  Pulse: 77 (Regular)  BP: 128/80 (Sitting, Left Arm, Standard)     Physical Exam  The physical exam findings are as follows: Note:WDWN in NAD HEENT: EOMI, sclera anicteric Neck: No masses, no thyromegaly Lungs: CTA bilaterally; normal respiratory effort CV: Regular rate and rhythm; no murmurs Abd: +bowel sounds, soft, non-tender,  no masses GU: bilateral descended testes; no testicular masses Visible reducible right inguinal Alejandro Griffin; no sign of left inguinal Alejandro Griffin Ext: Well-perfused; no edema Skin: Warm, dry; no sign of jaundice    Assessment & Plan   REDUCIBLE RIGHT INGUINAL Alejandro Griffin (550.90  K40.90)  Current Plans Schedule for Surgery - right inguinal Alejandro Griffin  repair with mesh. The surgical procedure has been discussed with the patient. Potential risks, benefits, alternative treatments, and expected outcomes have been explained. All of the patient's questions at this time have been answered. The likelihood of reaching the patient's treatment goal is good. The patient understand the proposed surgical procedure and wishes to proceed.  Imogene Burn. Georgette Dover, MD, [facility]  General/ Trauma Surgery  02/20/2015 7:15 AM

## 2015-02-20 NOTE — Anesthesia Procedure Notes (Signed)
Procedure Name: Intubation Date/Time: 02/20/2015 7:32 AM Performed by: Glory Buff Pre-anesthesia Checklist: Patient identified, Emergency Drugs available, Suction available and Patient being monitored Patient Re-evaluated:Patient Re-evaluated prior to inductionOxygen Delivery Method: Circle System Utilized Preoxygenation: Pre-oxygenation with 100% oxygen Intubation Type: IV induction Ventilation: Mask ventilation without difficulty Laryngoscope Size: Miller and 3 Grade View: Grade I Tube type: Oral Number of attempts: 1 Airway Equipment and Method: Stylet and Oral airway Placement Confirmation: ETT inserted through vocal cords under direct vision,  positive ETCO2 and breath sounds checked- equal and bilateral Secured at: 21 cm Tube secured with: Tape Dental Injury: Teeth and Oropharynx as per pre-operative assessment

## 2015-02-20 NOTE — Anesthesia Preprocedure Evaluation (Addendum)
Anesthesia Evaluation  Patient identified by MRN, date of birth, ID band Patient awake    Reviewed: Allergy & Precautions, NPO status , Patient's Chart, lab work & pertinent test results  History of Anesthesia Complications Negative for: history of anesthetic complications  Airway Mallampati: II  TM Distance: >3 FB Neck ROM: Full    Dental  (+) Teeth Intact, Dental Advisory Given   Pulmonary neg pulmonary ROS,    breath sounds clear to auscultation       Cardiovascular hypertension, Pt. on medications  Rhythm:Regular Rate:Normal     Neuro/Psych negative neurological ROS     GI/Hepatic Neg liver ROS, GERD  Medicated and Controlled,  Endo/Other  diabetes (glu 150), Insulin DependentHypothyroidism Morbid obesity  Renal/GU negative Renal ROS     Musculoskeletal   Abdominal (+) + obese,   Peds  Hematology negative hematology ROS (+)   Anesthesia Other Findings   Reproductive/Obstetrics                          Anesthesia Physical Anesthesia Plan  ASA: II  Anesthesia Plan: General   Post-op Pain Management:    Induction: Intravenous  Airway Management Planned: Oral ETT  Additional Equipment:   Intra-op Plan:   Post-operative Plan: Extubation in OR  Informed Consent: I have reviewed the patients History and Physical, chart, labs and discussed the procedure including the risks, benefits and alternatives for the proposed anesthesia with the patient or authorized representative who has indicated his/her understanding and acceptance.   Dental advisory given  Plan Discussed with: CRNA and Surgeon  Anesthesia Plan Comments: (Plan routine monitors, GETA Pt declines TAP block)       Anesthesia Quick Evaluation

## 2015-02-20 NOTE — Transfer of Care (Signed)
Immediate Anesthesia Transfer of Care Note  Patient: Alejandro Griffin  Procedure(s) Performed: Procedure(s): RIGHT INGUINAL HERNIA REPAIR WITH MESH (Right) INSERTION OF MESH (Right)  Patient Location: PACU  Anesthesia Type:General  Level of Consciousness: awake, alert  and oriented  Airway & Oxygen Therapy: Patient Spontanous Breathing and Patient connected to face mask oxygen  Post-op Assessment: Report given to RN and Post -op Vital signs reviewed and stable  Post vital signs: Reviewed and stable  Last Vitals:  Filed Vitals:   02/20/15 0538  BP: 121/77  Pulse: 94  Temp: 36.6 C  Resp: 16    Complications: No apparent anesthesia complications

## 2015-02-20 NOTE — Anesthesia Postprocedure Evaluation (Signed)
  Anesthesia Post-op Note  Patient: Alejandro Griffin  Procedure(s) Performed: Procedure(s): RIGHT INGUINAL HERNIA REPAIR WITH MESH (Right) INSERTION OF MESH (Right)  Patient Location: PACU  Anesthesia Type:General  Level of Consciousness: awake, alert , oriented and patient cooperative  Airway and Oxygen Therapy: Patient Spontanous Breathing  Post-op Pain: mild  Post-op Assessment: Post-op Vital signs reviewed, Patient's Cardiovascular Status Stable, Respiratory Function Stable, Patent Airway, No signs of Nausea or vomiting and Pain level controlled              Post-op Vital Signs: Reviewed and stable  Last Vitals:  Filed Vitals:   02/20/15 1055  BP: 117/66  Pulse: 58  Temp: 36.6 C  Resp: 16    Complications: No apparent anesthesia complications

## 2015-02-20 NOTE — Progress Notes (Signed)
Pt up in room and ambulated down hallway.  He tolerated well and is without complaints.

## 2015-02-20 NOTE — Op Note (Signed)
Hernia, Open, Procedure Note  Indications: The patient presented with a history of a right, reducible inguinal hernia.    Pre-operative Diagnosis: right reducible inguinal hernia Post-operative Diagnosis: same  Surgeon: Maia Petties.   Assistants: none  Anesthesia: General LMA anesthesia  ASA Class: 1  Procedure Details  The patient was seen again in the Holding Room. The risks, benefits, complications, treatment options, and expected outcomes were discussed with the patient. The possibilities of reaction to medication, pulmonary aspiration, perforation of viscus, bleeding, recurrent infection, the need for additional procedures, and development of a complication requiring transfusion or further operation were discussed with the patient and/or family. The likelihood of success in repairing the hernia and returning the patient to their previous functional status is good.  There was concurrence with the proposed plan, and informed consent was obtained. The site of surgery was properly noted/marked. The patient was taken to the Operating Room, identified as Alejandro Griffin, and the procedure verified as right inguinal hernia repair. A Time Out was held and the above information confirmed.  The patient was placed in the supine position and underwent induction of anesthesia. The lower abdomen and groin was prepped with Chloraprep and draped in the standard fashion, and 0.25% Marcaine with epinephrine was used to anesthetize the skin over the mid-portion of the inguinal canal. An oblique incision was made. Dissection was carried down through the subcutaneous tissue with cautery to the external oblique fascia.  We opened the external oblique fascia along the direction of its fibers to the external ring.  The spermatic cord was circumferentially dissected bluntly and retracted with a Penrose drain.  The ilioinguinal nerve was identified and preserved.  The floor of the inguinal canal was inspected and  was intact.  We skeletonized the spermatic cord and reduced a large indirect hernia sac and cord lipoma.  The internal ring was tightened with 0 vicryl  We used a right-sided Progrip mesh which was inserted and deployed across the floor of the inguinal canal. The mesh was tucked underneath the external oblique fascia laterally.  The flap of the mesh was closed around the spermatic cord to recreate the internal inguinal ring.  The mesh was secured to the pubic tubercle with 0 Vicryl.  Additional sutures were used to anchor the flap of the mesh as well as the lower edge of mesh to the shelving edge.  The external oblique fascia was reapproximated with 2-0 Vicryl.  3-0 Vicryl was used to close the subcutaneous tissues and 4-0 Monocryl was used to close the skin in subcuticular fashion.  Benzoin and steri-strips were used to seal the incision.  A clean dressing was applied.  The patient was then extubated and brought to the recovery room in stable condition.  All sponge, instrument, and needle counts were correct prior to closure and at the conclusion of the case.   Estimated Blood Loss: Minimal                 Complications: None; patient tolerated the procedure well.         Disposition: PACU - hemodynamically stable.         Condition: stable  Alejandro Griffin. Alejandro Dover, MD, [facility]  General/ Trauma Surgery  02/20/2015 8:24 AM

## 2015-02-20 NOTE — Discharge Instructions (Signed)
[facility], [place]  INGUINAL HERNIA REPAIR: POST OP INSTRUCTIONS  Always review your discharge instruction sheet given to you by the facility where your surgery was performed. IF YOU HAVE DISABILITY OR FAMILY LEAVE FORMS, YOU MUST BRING THEM TO THE OFFICE FOR PROCESSING.   DO NOT GIVE THEM TO YOUR DOCTOR.  1. A  prescription for pain medication may be given to you upon discharge.  Take your pain medication as prescribed, if needed.  If narcotic pain medicine is not needed, then you may take acetaminophen (Tylenol) or ibuprofen (Advil) as needed. 2. Take your usually prescribed medications unless otherwise directed. 3. If you need a refill on your pain medication, please contact your pharmacy.  They will contact our office to request authorization. Prescriptions will not be filled after 5 pm or on week-ends. 4. You should follow a light diet the first 24 hours after arrival home, such as soup and crackers, etc.  Be sure to include lots of fluids daily.  Resume your normal diet the day after surgery. 5. Most patients will experience some swelling and bruising around the umbilicus or in the groin and scrotum.  Ice packs and reclining will help.  Swelling and bruising can take several days to resolve.  6. It is common to experience some constipation if taking pain medication after surgery.  Increasing fluid intake and taking a stool softener (such as Colace) will usually help or prevent this problem from occurring.  A mild laxative (Milk of Magnesia or Miralax) should be taken according to package directions if there are no bowel movements after 48 hours. 7. Unless discharge instructions indicate otherwise, you may remove your bandages 24-48 hours after surgery, and you may shower at that time.  You will have steri-strips (small skin tapes) in place directly over the incision.  These strips should be left on the skin for 7-10 days. 8. ACTIVITIES:  You may resume regular (light) daily activities  beginning the next day--such as daily self-care, walking, climbing stairs--gradually increasing activities as tolerated.  You may have sexual intercourse when it is comfortable.  Refrain from any heavy lifting or straining until approved by your doctor. a. You may drive when you are no longer taking prescription pain medication, you can comfortably wear a seatbelt, and you can safely maneuver your car and apply brakes. b. RETURN TO WORK:  2-3 weeks with light duty - no lifting over 15 lbs. 9. You should see your doctor in the office for a follow-up appointment approximately 2-3 weeks after your surgery.  Make sure that you call for this appointment within a day or two after you arrive home to insure a convenient appointment time. 10. OTHER INSTRUCTIONS:  __________________________________________________________________________________________________________________________________________________________________________________________  WHEN TO CALL YOUR DOCTOR: 1. Fever over 101.0 2. Inability to urinate 3. Nausea and/or vomiting 4. Extreme swelling or bruising 5. Continued bleeding from incision. 6. Increased pain, redness, or drainage from the incision  The clinic staff is available to answer your questions during regular business hours.  Please dont hesitate to call and ask to speak to one of the nurses for clinical concerns.  If you have a medical emergency, go to the nearest emergency room or call 911.  A surgeon from [facility] is always on call at the hospital   [address] [place], [place], [place]  [address] ?  [address], [place], [place]   [address] [phone]    FAX [phone] Web site: [email or web address]  General Anesthesia, Adult, Care After Refer to this sheet in the next few weeks. These instructions provide you with information on caring for yourself after your procedure. Your health care provider may also give  you more specific instructions. Your treatment has been planned according to current medical practices, but problems sometimes occur. Call your health care provider if you have any problems or questions after your procedure. WHAT TO EXPECT AFTER THE PROCEDURE After the procedure, it is typical to experience:  Sleepiness.  Nausea and vomiting. HOME CARE INSTRUCTIONS  For the first 24 hours after general anesthesia:  Have a responsible person with you.  Do not drive a car. If you are alone, do not take public transportation.  Do not drink alcohol.  Do not take medicine that has not been prescribed by your health care provider.  Do not sign important papers or make important decisions.  You may resume a normal diet and activities as directed by your health care provider.  Change bandages (dressings) as directed.  If you have questions or problems that seem related to general anesthesia, call the hospital and ask for the anesthetist or anesthesiologist on call. SEEK MEDICAL CARE IF:  You have nausea and vomiting that continue the day after anesthesia.  You develop a rash. SEEK IMMEDIATE MEDICAL CARE IF:   You have difficulty breathing.  You have chest pain.  You have any allergic problems.   This information is not intended to replace advice given to you by your health care provider. Make sure you discuss any questions you have with your health care provider.   Document Released: 07/12/2000 Document Revised: 04/26/2014 Document Reviewed: 08/04/2011 Elsevier Interactive Patient Education [employer].

## 2015-02-21 ENCOUNTER — Other Ambulatory Visit: Payer: Self-pay | Admitting: Family Medicine

## 2015-04-25 ENCOUNTER — Ambulatory Visit (INDEPENDENT_AMBULATORY_CARE_PROVIDER_SITE_OTHER): Payer: Managed Care, Other (non HMO) | Admitting: Family Medicine

## 2015-04-25 ENCOUNTER — Encounter: Payer: Self-pay | Admitting: Family Medicine

## 2015-04-25 VITALS — BP 128/88 | HR 98 | Ht 67.0 in | Wt 201.8 lb

## 2015-04-25 DIAGNOSIS — E118 Type 2 diabetes mellitus with unspecified complications: Secondary | ICD-10-CM | POA: Diagnosis not present

## 2015-04-25 DIAGNOSIS — Z794 Long term (current) use of insulin: Secondary | ICD-10-CM | POA: Diagnosis not present

## 2015-04-25 DIAGNOSIS — E119 Type 2 diabetes mellitus without complications: Secondary | ICD-10-CM

## 2015-04-25 DIAGNOSIS — E1169 Type 2 diabetes mellitus with other specified complication: Secondary | ICD-10-CM | POA: Diagnosis not present

## 2015-04-25 DIAGNOSIS — E1159 Type 2 diabetes mellitus with other circulatory complications: Secondary | ICD-10-CM | POA: Insufficient documentation

## 2015-04-25 DIAGNOSIS — E669 Obesity, unspecified: Secondary | ICD-10-CM

## 2015-04-25 DIAGNOSIS — E785 Hyperlipidemia, unspecified: Secondary | ICD-10-CM

## 2015-04-25 DIAGNOSIS — I10 Essential (primary) hypertension: Secondary | ICD-10-CM

## 2015-04-25 DIAGNOSIS — I152 Hypertension secondary to endocrine disorders: Secondary | ICD-10-CM | POA: Insufficient documentation

## 2015-04-25 LAB — POCT GLYCOSYLATED HEMOGLOBIN (HGB A1C): Hemoglobin A1C: 8.5

## 2015-04-25 MED ORDER — CANAGLIFLOZIN 300 MG PO TABS: 300.0000 mg | ORAL_TABLET | Freq: Every day | ORAL | Status: DC

## 2015-04-25 NOTE — Progress Notes (Signed)
  Subjective:    Patient ID: Alejandro Griffin, male    DOB: Dec 18, 1980, 35 y.o.   MRN: [id number]  Alejandro Griffin is a 35 y.o. male who presents for follow-up of Type 2 diabetes mellitus.  Home blood sugar records: Are usually in the 150 range however he has some in the 400 range. Current symptoms/problems include none and have been stable. Daily foot checks:   Any foot concerns: None. Exercise: The patient does not participate in regular exercise at present. He has had an eye exam within the last year. The following portions of the patient's history were reviewed and updated as appropriate: allergies, current medications, past medical history, past social history and problem list.  ROS as in subjective above.     Objective:    Physical Exam Alert and in no distress otherwise not examined.  Blood pressure 128/88, pulse 98, height 5\' 7"  (1.702 m), weight 201 lb 12.8 oz (91.536 kg), SpO2 97 %.  Lab Review Diabetic Labs Latest Ref Rng 04/25/2015 02/14/2015 12/06/2014 09/26/2014 07/19/2014  HbA1c - 8.5 - 7.3 - 7.3  Chol 0 - 200 mg/dL - - - - 171  HDL >=40 mg/dL - - - - 55  Calc LDL 0 - 99 mg/dL - - - - 103(H)  Triglycerides <150 mg/dL - - - - 67  Creatinine 0.61 - 1.24 mg/dL - 1.15 - 0.93 0.97   BP/Weight 04/25/2015 02/20/2015 02/14/2015 12/13/2014 [id number]  Systolic BP 0000000 123XX123 123456 123XX123 [id number]  Diastolic BP 88 66 67 70 80  Wt. (Lbs) 201.8 201.5 201.5 200.5 201  BMI 31.6 31.55 31.55 30.92 30.57   Foot/eye exam completion dates Latest Ref Rng 07/26/2014 07/19/2014  Eye Exam No Retinopathy No Retinopathy -  Foot Form Completion - - Done   hemoglobin A1c 8.5  Alejandro Griffin  reports that he has never smoked. He has never used smokeless tobacco. He reports that he does not drink alcohol or use illicit drugs.     Assessment & Plan:    Type 2 diabetes mellitus with complication, with long-term current use of insulin (HCC) - Plan: POCT glycosylated hemoglobin (Hb A1C), POCT glycosylated hemoglobin (Hb A1C),  canagliflozin (INVOKANA) 300 MG TABS tablet  Hyperlipidemia associated with type 2 diabetes mellitus (HCC)  Diabetes mellitus, type II, insulin dependent (HCC)  Obesity (BMI 30-39.9)  Hypertension associated with diabetes ([place])   Rx changes: Increase Invokana -300 mg daily  Education: Reviewed 'ABCs' of diabetes management (respective goals in parentheses):  A1C (<7), blood pressure (<130/80), and cholesterol (LDL <100).  Compliance at present is estimated to be fair. Efforts to improve compliance (if necessary) will be directed at increased exercise.  Follow up: 4 months  I also instructed him to slowly increase his insulin 2 units every 2 days until his blood sugar is under 120. Careful especially since I increased his Invokana.

## 2015-05-02 ENCOUNTER — Other Ambulatory Visit: Payer: Self-pay | Admitting: Family Medicine

## 2015-05-20 ENCOUNTER — Telehealth: Payer: Self-pay | Admitting: Family Medicine

## 2015-05-20 NOTE — Telephone Encounter (Signed)
P.A. INVOKANA

## 2015-05-21 NOTE — Telephone Encounter (Signed)
P.A. Isabelle Course, not covered medication, no alternative listed  Pt informed & given samples #15 Invokana 300mg  ok per VS

## 2015-05-23 MED ORDER — DAPAGLIFLOZIN PROPANEDIOL 5 MG PO TABS: 5.0000 mg | ORAL_TABLET | Freq: Every day | ORAL | Status: DC

## 2015-05-23 NOTE — Telephone Encounter (Signed)
Called [facility] and Invokana is not covered.  Only 2 covered alternatives, [place] and Jardiance.  Both cost same $40 for 30 days or $90 for 90 days.   Do you want to switch?  I believe [facility]ade has the $0 co pay with co pay card

## 2015-05-23 NOTE — Telephone Encounter (Signed)
I called in [place]. Have him check back with me with his next diabetes appointment

## 2015-05-27 ENCOUNTER — Telehealth: Payer: Self-pay | Admitting: Family Medicine

## 2015-05-27 NOTE — Telephone Encounter (Signed)
Called pt twice & left message regarding medication change

## 2015-06-01 ENCOUNTER — Other Ambulatory Visit: Payer: Self-pay | Admitting: Family Medicine

## 2015-06-06 ENCOUNTER — Encounter: Payer: Self-pay | Admitting: Family Medicine

## 2015-06-06 ENCOUNTER — Ambulatory Visit (INDEPENDENT_AMBULATORY_CARE_PROVIDER_SITE_OTHER): Payer: Managed Care, Other (non HMO) | Admitting: Family Medicine

## 2015-06-06 VITALS — BP 124/78 | HR 64 | Temp 97.7°F | Wt 203.0 lb

## 2015-06-06 DIAGNOSIS — J069 Acute upper respiratory infection, unspecified: Secondary | ICD-10-CM | POA: Diagnosis not present

## 2015-06-06 DIAGNOSIS — J029 Acute pharyngitis, unspecified: Secondary | ICD-10-CM | POA: Diagnosis not present

## 2015-06-06 MED ORDER — AMOXICILLIN 875 MG PO TABS: 875.0000 mg | ORAL_TABLET | Freq: Two times a day (BID) | ORAL | Status: DC

## 2015-06-06 NOTE — Progress Notes (Signed)
Subjective:  Alejandro Griffin is a 35 y.o. male who presents for 5 day history of sinus pressure, post nasal drainage and hoarse voice.  Sore throat started on Monday and has progressively gotten worse.    Denies fever, ear pain, chills, body aches, nausea, vomiting, diarrhea.   Also complains of bruised 2nd toe on left foot from jamming it playing basketball. States it is not really bothering him but his wife would like for it to be checked since he has diabetes. Reports his blood sugars are pretty well controlled.   Treatment to date: Dayquil, nyguil.  Denies sick contacts.  No other aggravating or relieving factors.  No other c/o.  ROS as in subjective.   Objective: Filed Vitals:   06/06/15 0954  BP: 124/78  Pulse: 64  Temp: 97.7 F (36.5 C)    General appearance: Alert, WD/WN, no distress, mildly ill appearing                             Skin: warm, no rash                           Head: no sinus tenderness                            Eyes: conjunctiva normal, corneas clear, PERRLA                            Ears: pearly TMs, external ear canals normal                          Nose: septum midline, turbinates swollen, with erythema and clear discharge             Mouth/throat: MMM, tongue normal, mild pharyngeal erythema                           Neck: supple, no adenopathy, no thyromegaly, nontender                          Heart: RRR, normal S1, S2, no murmurs                         Lungs: CTA bilaterally, no wheezes, rales, or rhonchi                               Ext: left foot 2nd toe with bruising, skin intact, no obvious deformity, no edema, normal sensation, nontender.   Assessment: Acute pharyngitis, unspecified etiology - Plan: amoxicillin (AMOXIL) 875 MG tablet  Acute upper respiratory infection - Plan: amoxicillin (AMOXIL) 875 MG tablet   Plan: Discussed diagnosis and treatment of URI and acute pharyngitis. . Also discussed that I am sending in a prescription  for amoxicillin but I recommend he give his illness a couple of more days to see if he turns the corner. Use salt water gargles and drink plenty of fluids for sore throat. He can take Tylenol or ibuprofen for fever or body aches or pains. If he is not getting better in the next 2-3 days he should start the antibiotic. If he does take the antibiotic he'll let me  know if he is not back to normal after completing it.

## 2015-06-06 NOTE — Patient Instructions (Signed)
I am sending in a prescription for amoxicillin to your pharmacy but I recommend you give your illness a couple of more days to see if you turn the corner. Use salt water gargles and drink plenty of fluids for your sore throat. You can take Tylenol or ibuprofen for fever or body aches or pains. If you are not getting better in the next 2-3 days you can start the antibiotic. If you do take the antibiotic you should be back to normal after completing it and if you are not call us.

## 2015-06-11 ENCOUNTER — Encounter: Payer: Self-pay | Admitting: Family Medicine

## 2015-06-11 ENCOUNTER — Ambulatory Visit (INDEPENDENT_AMBULATORY_CARE_PROVIDER_SITE_OTHER): Payer: Managed Care, Other (non HMO) | Admitting: Family Medicine

## 2015-06-11 VITALS — BP 118/70 | HR 64 | Temp 97.7°F | Wt 201.8 lb

## 2015-06-11 DIAGNOSIS — R109 Unspecified abdominal pain: Secondary | ICD-10-CM

## 2015-06-11 DIAGNOSIS — R1031 Right lower quadrant pain: Secondary | ICD-10-CM | POA: Diagnosis not present

## 2015-06-11 LAB — POCT URINALYSIS DIPSTICK
Bilirubin, UA: NEGATIVE
Blood, UA: NEGATIVE
Ketones, UA: NEGATIVE
Leukocytes, UA: NEGATIVE
Nitrite, UA: NEGATIVE
Protein, UA: NEGATIVE
Spec Grav, UA: 1.025
Urobilinogen, UA: NEGATIVE
pH, UA: 6

## 2015-06-11 NOTE — Progress Notes (Signed)
   Subjective:    Patient ID: Alejandro Griffin, male    DOB: April 29, 1980, 35 y.o.   MRN: [id number]  HPI He has had several episodes of right lower quadrant pain just proximal to his surgical incision site. The surgery was done several months ago. He has had approximately 3 of these. The pain lasts for several seconds and then goes away.   Review of Systems     Objective:   Physical Exam No right lower quadrant abdominal tenderness. He does have some residual thickening in the area of the incision. Proximal femur is normal.       Assessment & Plan:  Right sided abdominal pain - Plan: POCT Urinalysis Dipstick  Right lower quadrant pain explained that I did not have a cause for this but most likely related to his surgery. Watchful waiting would be appropriate. He was comfortable with that.

## 2015-07-02 ENCOUNTER — Encounter: Payer: Self-pay | Admitting: Family Medicine

## 2015-07-02 ENCOUNTER — Other Ambulatory Visit: Payer: Self-pay | Admitting: Family Medicine

## 2015-07-02 ENCOUNTER — Ambulatory Visit (INDEPENDENT_AMBULATORY_CARE_PROVIDER_SITE_OTHER): Payer: Managed Care, Other (non HMO) | Admitting: Family Medicine

## 2015-07-02 VITALS — BP 120/80 | HR 64 | Temp 98.2°F

## 2015-07-02 DIAGNOSIS — J029 Acute pharyngitis, unspecified: Secondary | ICD-10-CM | POA: Diagnosis not present

## 2015-07-02 DIAGNOSIS — R6889 Other general symptoms and signs: Secondary | ICD-10-CM | POA: Diagnosis not present

## 2015-07-02 NOTE — Progress Notes (Signed)
   Subjective:    Patient ID: Alejandro Griffin, male    DOB: Jun 08, 1980, 35 y.o.   MRN: [id number]  HPI Chief Complaint  Patient presents with  . possible flu    possible flu   He is here with complaints of chills and body aches that started 2 days ago and sinus pressure and sore throat since yesterday. Has also had dry cough on and off.  Denies fever, nasal drainage, shortness of breath, ear pain, nausea, vomiting and diarrhea.  Does not smoke. No recent antibiotics. No history of bronchitis or pneumonia. He did not get a flu shot this year.    Review of Systems Pertinent positives and negatives in the history of present illness.     Objective:   Physical Exam BP 120/80 mmHg  Pulse 64  Temp(Src) 98.2 F (36.8 C) (Oral) Alert and in no distress. No sinus tenderness. Tympanic membranes and canals are normal. Pharyngeal area is erythematous without exudate. Neck is supple without adenopathy or thyromegaly. Cardiac exam shows a regular sinus rhythm without murmurs or gallops. Lungs are clear to auscultation.  Flu swab: negative Rapid strep: negative    Assessment & Plan:  Flu-like symptoms - Plan: POC Influenza A&B(BINAX/QUICKVUE)  Acute pharyngitis, unspecified etiology - Plan: POCT rapid strep A  Discussed that his flu swab is negative, however, this does not entirely rule out the possibility of having influenza. Suspect that his symptoms are related to viral etiology and recommend symptomatic treatment. Recommend saltwater gargles for throat discomfort, staying well hydrated, Tylenol or ibuprofen for fever and body aches, Mucinex DM or Delsym for cough. Recommend he call if not improving in the next 2-3 days.

## 2015-07-03 LAB — POC INFLUENZA A&B (BINAX/QUICKVUE)
Influenza A, POC: NEGATIVE
Influenza B, POC: NEGATIVE

## 2015-07-03 LAB — POCT RAPID STREP A (OFFICE): Rapid Strep A Screen: NEGATIVE

## 2015-07-05 ENCOUNTER — Telehealth: Payer: Self-pay | Admitting: Family Medicine

## 2015-07-05 NOTE — Telephone Encounter (Signed)
[facility]'d [facility] that Lantus Solostar not covered use alternatives, Basaglar, Levemir, Tyler Aas Do you want to switch?

## 2015-07-06 NOTE — Telephone Encounter (Signed)
Switch to Levemir at the same dosing

## 2015-07-07 ENCOUNTER — Other Ambulatory Visit: Payer: Self-pay | Admitting: Family Medicine

## 2015-07-07 NOTE — Telephone Encounter (Signed)
Alejandro Griffin states he just picked up the Lantus in Jan.  I explained his insurance is not covering.  He will double check and call us back

## 2015-07-08 ENCOUNTER — Other Ambulatory Visit: Payer: Self-pay | Admitting: Family Medicine

## 2015-07-08 MED ORDER — INSULIN DETEMIR 100 UNIT/ML ~~LOC~~ SOLN
18.0000 [IU] | Freq: Every day | SUBCUTANEOUS | Status: DC
Start: 2015-07-08 — End: 2015-08-25

## 2015-07-08 NOTE — Telephone Encounter (Signed)
Pt called back and said his insurance will no longer pay.  He states go ahead and call in the [place].  He said it did not work last time.  I asked did he want to try one of the other two and he said no.  He will let us know how this is working for him.  He is taking 18 units per day.    [facility]

## 2015-07-23 ENCOUNTER — Telehealth: Payer: Self-pay | Admitting: *Deleted

## 2015-07-23 ENCOUNTER — Telehealth: Payer: Self-pay | Admitting: Family Medicine

## 2015-07-23 ENCOUNTER — Other Ambulatory Visit: Payer: Self-pay | Admitting: *Deleted

## 2015-07-23 DIAGNOSIS — E785 Hyperlipidemia, unspecified: Principal | ICD-10-CM

## 2015-07-23 DIAGNOSIS — E1169 Type 2 diabetes mellitus with other specified complication: Secondary | ICD-10-CM

## 2015-07-23 DIAGNOSIS — R112 Nausea with vomiting, unspecified: Secondary | ICD-10-CM

## 2015-07-23 MED ORDER — PANTOPRAZOLE SODIUM 40 MG PO TBEC: DELAYED_RELEASE_TABLET | ORAL | Status: DC

## 2015-07-23 MED ORDER — ATORVASTATIN CALCIUM 20 MG PO TABS: 20.0000 mg | ORAL_TABLET | Freq: Every day | ORAL | Status: DC

## 2015-07-23 NOTE — Telephone Encounter (Signed)
Filled to last until appt in may

## 2015-07-23 NOTE — Telephone Encounter (Signed)
Rcvd 90 day refill request for Atorvastatin 20mg 

## 2015-07-23 NOTE — Telephone Encounter (Signed)
Per Dr. Zenovia Jarred, Ok to give a 90 day supply of the Pantoprazole sodium 40 mg. Take 1 tab, by mouth,  before breakfast daily.Patient saw Nicoletta Ba PA August of 2016.  Patient had EGD with Dr. Hilarie Fredrickson on 11-07-2015.

## 2015-07-25 ENCOUNTER — Ambulatory Visit (INDEPENDENT_AMBULATORY_CARE_PROVIDER_SITE_OTHER): Payer: Managed Care, Other (non HMO) | Admitting: Family Medicine

## 2015-07-25 ENCOUNTER — Encounter: Payer: Self-pay | Admitting: Family Medicine

## 2015-07-25 VITALS — BP 124/64 | HR 64 | Temp 98.2°F | Wt 198.4 lb

## 2015-07-25 DIAGNOSIS — R059 Cough, unspecified: Secondary | ICD-10-CM

## 2015-07-25 DIAGNOSIS — J0140 Acute pansinusitis, unspecified: Secondary | ICD-10-CM

## 2015-07-25 DIAGNOSIS — R05 Cough: Secondary | ICD-10-CM

## 2015-07-25 MED ORDER — AMOXICILLIN-POT CLAVULANATE 875-125 MG PO TABS: 1.0000 | ORAL_TABLET | Freq: Two times a day (BID) | ORAL | Status: DC

## 2015-07-25 MED ORDER — BENZONATATE 200 MG PO CAPS: 200.0000 mg | ORAL_CAPSULE | Freq: Two times a day (BID) | ORAL | Status: DC | PRN

## 2015-07-25 NOTE — Progress Notes (Signed)
Subjective:  Alejandro Griffin is a 35 y.o. male who presents for possible sinus infection.  Symptoms include a 9 day history of bilateral frontal and maxillary sinus pressure, headache, yellowish phlegm out bilateral nares, mild sore throat, cough.  Denies fever, chills, ear pain, nausea, vomiting, or diarrhea.   Past history is significant for occasional episodes of bronchitis. Patient is a non-smoker.  Using mucinex DM for symptoms.  Denies sick contacts.  No other aggravating or relieving factors.  No other c/o.  ROS as in subjective   Objective: Filed Vitals:   07/25/15 1016  BP: 124/64  Pulse: 64  Temp: 98.2 F (36.8 C)    General appearance: Alert, WD/WN, no distress                             Skin: warm, no rash                           Head: + bilateral frontal and maxillary sinus tenderness,                            Eyes: conjunctiva normal, corneas clear, PERRLA                            Ears: pearly TMs, external ear canals normal                          Nose: septum midline, turbinates swollen, with erythema and clear discharge             Mouth/throat: MMM, tongue normal, mild pharyngeal erythema                           Neck: supple, no adenopathy, no thyromegaly, nontender                          Heart: RRR, normal S1, S2, no murmurs                         Lungs: CTA bilaterally, no wheezes, rales, or rhonchi      Assessment and Plan: Acute pansinusitis, recurrence not specified - Plan: amoxicillin-clavulanate (AUGMENTIN) 875-125 MG tablet  Cough - Plan: amoxicillin-clavulanate (AUGMENTIN) 875-125 MG tablet, benzonatate (TESSALON) 200 MG capsule    Prescription sent for Augmentin and Tessalon.  Can use OTC Mucinex for congestion. Delsym for cough.  Tylenol or Ibuprofen OTC for fever and malaise.  Discussed symptomatic relief, nasal saline flush, and call or return if not back to baseline after completing the antibiotic.

## 2015-07-25 NOTE — Patient Instructions (Addendum)
Use saline nasal spray, I sent in Tessalon Perles for cough or you can you over the counter Mucinex DM or Delsym. Prescription sent for Augmentin to your pharmacy. Stay well hydrated. If you are not back to normal after completing the antibiotic let me know.   Sinusitis, Adult Sinusitis is redness, soreness, and inflammation of the paranasal sinuses. Paranasal sinuses are air pockets within the bones of your face. They are located beneath your eyes, in the middle of your forehead, and above your eyes. In healthy paranasal sinuses, mucus is able to drain out, and air is able to circulate through them by way of your nose. However, when your paranasal sinuses are inflamed, mucus and air can become trapped. This can allow bacteria and other germs to grow and cause infection. Sinusitis can develop quickly and last only a short time (acute) or continue over a long period (chronic). Sinusitis that lasts for more than 12 weeks is considered chronic. CAUSES Causes of sinusitis include:  Allergies.  Structural abnormalities, such as displacement of the cartilage that separates your nostrils (deviated septum), which can decrease the air flow through your nose and sinuses and affect sinus drainage.  Functional abnormalities, such as when the small hairs (cilia) that line your sinuses and help remove mucus do not work properly or are not present. SIGNS AND SYMPTOMS Symptoms of acute and chronic sinusitis are the same. The primary symptoms are pain and pressure around the affected sinuses. Other symptoms include:  Upper toothache.  Earache.  Headache.  Bad breath.  Decreased sense of smell and taste.  A cough, which worsens when you are lying flat.  Fatigue.  Fever.  Thick drainage from your nose, which often is green and may contain pus (purulent).  Swelling and warmth over the affected sinuses. DIAGNOSIS Your health care provider will perform a physical exam. During your exam, your health  care provider may perform any of the following to help determine if you have acute sinusitis or chronic sinusitis:  Look in your nose for signs of abnormal growths in your nostrils (nasal polyps).  Tap over the affected sinus to check for signs of infection.  View the inside of your sinuses using an imaging device that has a light attached (endoscope). If your health care provider suspects that you have chronic sinusitis, one or more of the following tests may be recommended:  Allergy tests.  Nasal culture. A sample of mucus is taken from your nose, sent to a lab, and screened for bacteria.  Nasal cytology. A sample of mucus is taken from your nose and examined by your health care provider to determine if your sinusitis is related to an allergy. TREATMENT Most cases of acute sinusitis are related to a viral infection and will resolve on their own within 10 days. Sometimes, medicines are prescribed to help relieve symptoms of both acute and chronic sinusitis. These may include pain medicines, decongestants, nasal steroid sprays, or saline sprays. However, for sinusitis related to a bacterial infection, your health care provider will prescribe antibiotic medicines. These are medicines that will help kill the bacteria causing the infection. Rarely, sinusitis is caused by a fungal infection. In these cases, your health care provider will prescribe antifungal medicine. For some cases of chronic sinusitis, surgery is needed. Generally, these are cases in which sinusitis recurs more than 3 times per year, despite other treatments. HOME CARE INSTRUCTIONS  Drink plenty of water. Water helps thin the mucus so your sinuses can drain more easily.  Use a humidifier.  Inhale steam 3-4 times a day (for example, sit in the bathroom with the shower running).  Apply a warm, moist washcloth to your face 3-4 times a day, or as directed by your health care provider.  Use saline nasal sprays to help moisten  and clean your sinuses.  Take medicines only as directed by your health care provider.  If you were prescribed either an antibiotic or antifungal medicine, finish it all even if you start to feel better. SEEK IMMEDIATE MEDICAL CARE IF:  You have increasing pain or severe headaches.  You have nausea, vomiting, or drowsiness.  You have swelling around your face.  You have vision problems.  You have a stiff neck.  You have difficulty breathing.   This information is not intended to replace advice given to you by your health care provider. Make sure you discuss any questions you have with your health care provider.   Document Released: 04/05/2005 Document Revised: 04/26/2014 Document Reviewed: 04/20/2011 Elsevier Interactive Patient Education [employer].

## 2015-08-02 ENCOUNTER — Other Ambulatory Visit: Payer: Self-pay | Admitting: Family Medicine

## 2015-08-08 ENCOUNTER — Telehealth: Payer: Self-pay | Admitting: Family Medicine

## 2015-08-08 ENCOUNTER — Other Ambulatory Visit: Payer: Self-pay

## 2015-08-08 MED ORDER — DAPAGLIFLOZIN PROPANEDIOL 5 MG PO TABS: 5.0000 mg | ORAL_TABLET | Freq: Every day | ORAL | Status: DC

## 2015-08-08 NOTE — Telephone Encounter (Signed)
Med sent in.

## 2015-08-08 NOTE — Telephone Encounter (Signed)
Sent in farxiga

## 2015-08-08 NOTE — Telephone Encounter (Signed)
Rcvd refill request to change Farxiga 5 mg script to a 90 day script

## 2015-08-23 ENCOUNTER — Other Ambulatory Visit: Payer: Self-pay | Admitting: Family Medicine

## 2015-08-25 ENCOUNTER — Telehealth: Payer: Self-pay | Admitting: Family Medicine

## 2015-08-25 ENCOUNTER — Other Ambulatory Visit: Payer: Self-pay

## 2015-08-25 MED ORDER — INSULIN DETEMIR 100 UNIT/ML FLEXPEN: 18.0000 [IU] | PEN_INJECTOR | Freq: Every day | SUBCUTANEOUS | Status: DC

## 2015-08-25 MED ORDER — GLUCOSE BLOOD VI STRP: ORAL_STRIP | Status: DC

## 2015-08-25 NOTE — Telephone Encounter (Signed)
Pt needs refills One Touch Ultra test strips and also his Levemir was called in as a vial and he uses flex pen, please [facility]

## 2015-08-25 NOTE — Telephone Encounter (Signed)
Sent in

## 2015-08-25 NOTE — Telephone Encounter (Signed)
Dr.Lalonde the pharmacy said that ins. Would only pay for levemir instead of lantus does the dose need to change please let me know

## 2015-08-25 NOTE — Telephone Encounter (Signed)
I know this I sent it to Dr.Lalonde to make sure no change in med is needed except for the pen not vials

## 2015-08-27 ENCOUNTER — Other Ambulatory Visit: Payer: Self-pay | Admitting: Family Medicine

## 2015-08-27 ENCOUNTER — Telehealth: Payer: Self-pay | Admitting: Family Medicine

## 2015-08-27 NOTE — Telephone Encounter (Signed)
Recv'd fax from pharmacy that Lantus requires P.A.  This has been switched to Levemir

## 2015-09-05 ENCOUNTER — Ambulatory Visit (INDEPENDENT_AMBULATORY_CARE_PROVIDER_SITE_OTHER): Payer: Managed Care, Other (non HMO) | Admitting: Family Medicine

## 2015-09-05 ENCOUNTER — Encounter: Payer: Self-pay | Admitting: Family Medicine

## 2015-09-05 VITALS — BP 130/80 | HR 70 | Ht 67.0 in | Wt 198.0 lb

## 2015-09-05 DIAGNOSIS — E119 Type 2 diabetes mellitus without complications: Secondary | ICD-10-CM

## 2015-09-05 DIAGNOSIS — E669 Obesity, unspecified: Secondary | ICD-10-CM

## 2015-09-05 DIAGNOSIS — E1159 Type 2 diabetes mellitus with other circulatory complications: Secondary | ICD-10-CM | POA: Diagnosis not present

## 2015-09-05 DIAGNOSIS — I10 Essential (primary) hypertension: Secondary | ICD-10-CM

## 2015-09-05 DIAGNOSIS — E1169 Type 2 diabetes mellitus with other specified complication: Secondary | ICD-10-CM

## 2015-09-05 DIAGNOSIS — Z794 Long term (current) use of insulin: Secondary | ICD-10-CM

## 2015-09-05 DIAGNOSIS — E785 Hyperlipidemia, unspecified: Secondary | ICD-10-CM | POA: Diagnosis not present

## 2015-09-05 LAB — POCT GLYCOSYLATED HEMOGLOBIN (HGB A1C): Hemoglobin A1C: 14

## 2015-09-05 LAB — POCT UA - MICROALBUMIN
Albumin/Creatinine Ratio, Urine, POC: 8.6
Creatinine, POC: 91.4 mg/dL
Microalbumin Ur, POC: 7.9 mg/L

## 2015-09-05 MED ORDER — DAPAGLIFLOZIN PROPANEDIOL 10 MG PO TABS: 10.0000 mg | ORAL_TABLET | Freq: Every day | ORAL | Status: DC

## 2015-09-05 NOTE — Patient Instructions (Signed)
Increase your insulin by 2 units every 2 days until your morning blood sugar is under 120. Pain pain use a eating habits and activity habits. Alter your injection sites even more

## 2015-09-05 NOTE — Progress Notes (Signed)
  Subjective:    Patient ID: Alejandro Griffin, male    DOB: [birth year], 35 y.o.   MRN: [id number]  Alejandro Griffin is a 35 y.o. male who presents for follow-up of Type 2 diabetes mellitus.  Patient is checking home blood sugars.   Home blood sugar records: high 441 low 120 How often is blood sugars being checked:BID Current symptoms/problems none Daily foot checks: yes   Any foot concerns:  none Last eye exam: 07/26/14 Exercise: working  he has increased his insulin up to 18 units per day. He has been looking at this to determine why his blood sugars have been fluctuating. The following portions of the patient's history were reviewed and updated as appropriate: allergies, current medications, past medical history, past social history and problem list.  ROS as in subjective above.     Objective:    Physical Exam Alert and in no distress otherwise not examined.   Lab Review Diabetic Labs Latest Ref Rng 04/25/2015 02/14/2015 12/06/2014 09/26/2014 07/19/2014  HbA1c - 8.5 - 7.3 - 7.3  Chol 0 - 200 mg/dL - - - - 171  HDL >=40 mg/dL - - - - 55  Calc LDL 0 - 99 mg/dL - - - - 103(H)  Triglycerides <150 mg/dL - - - - 67  Creatinine 0.61 - 1.24 mg/dL - 1.15 - 0.93 0.97   BP/Weight 07/25/2015 07/02/2015 06/11/2015 [id number]  Systolic BP [id number] [id number] 123456 [id number]  Diastolic BP 64 80 70 78 88  Wt. (Lbs) 198.4 - 201.8 203 201.8  BMI 31.07 - 31.6 31.79 31.6   Foot/eye exam completion dates Latest Ref Rng 07/26/2014 07/19/2014  Eye Exam No Retinopathy No Retinopathy -  Foot Form Completion - - Done   A1c 14.0  Alejandro Griffin  reports that he has never smoked. He has never used smokeless tobacco. He reports that he does not drink alcohol or use illicit drugs.     Assessment & Plan:    Diabetes mellitus, type II, insulin dependent (HCC)  Obesity (BMI 30-39.9)  Hyperlipidemia associated with type 2 diabetes mellitus ([place])  Hypertension associated with diabetes ([place])   1. Rx changes:  increase  Farxiga  to 10 mg 2. Education: Reviewed 'ABCs' of diabetes management (respective goals in parentheses):  A1C (<7), blood pressure (<130/80), and cholesterol (LDL <100). 3. Compliance at present is estimated to be fair. Efforts to improve compliance (if necessary) will be directed at  he is to do a better job of alternating injection sites. I discussed this with him in detail.Marland Kitchen He is also to increase his insulin by 2 units every 2 days until his blood sugar is under 120 in the morning. Discussed the benefits of maintaining regular diet, exercise and medication usage. 4. Follow up: 4 months

## 2015-09-08 ENCOUNTER — Other Ambulatory Visit: Payer: Self-pay

## 2015-09-08 ENCOUNTER — Telehealth: Payer: Self-pay

## 2015-09-08 MED ORDER — LEVOTHYROXINE SODIUM 100 MCG PO TABS: ORAL_TABLET | ORAL | Status: DC

## 2015-09-08 NOTE — Telephone Encounter (Signed)
Fax rcvd for 90 day supply of [facility]g [facility]. Alejandro Griffin

## 2015-09-08 NOTE — Telephone Encounter (Signed)
Med sent in for 90 days

## 2015-09-22 ENCOUNTER — Other Ambulatory Visit: Payer: Self-pay | Admitting: Family Medicine

## 2015-10-20 ENCOUNTER — Telehealth: Payer: Self-pay

## 2015-10-20 NOTE — Telephone Encounter (Signed)
Pt needs refill of atorvastatin called to [place]. 90 day please

## 2015-10-22 ENCOUNTER — Other Ambulatory Visit: Payer: Self-pay

## 2015-10-22 DIAGNOSIS — E785 Hyperlipidemia, unspecified: Principal | ICD-10-CM

## 2015-10-22 DIAGNOSIS — E1169 Type 2 diabetes mellitus with other specified complication: Secondary | ICD-10-CM

## 2015-10-22 MED ORDER — ATORVASTATIN CALCIUM 20 MG PO TABS: 20.0000 mg | ORAL_TABLET | Freq: Every day | ORAL | Status: DC

## 2015-10-22 NOTE — Telephone Encounter (Signed)
Sent in

## 2015-11-12 ENCOUNTER — Encounter: Payer: Self-pay | Admitting: Medical

## 2015-11-12 ENCOUNTER — Ambulatory Visit (INDEPENDENT_AMBULATORY_CARE_PROVIDER_SITE_OTHER): Payer: Managed Care, Other (non HMO) | Admitting: Medical

## 2015-11-12 VITALS — BP 116/82 | HR 89 | Wt 205.0 lb

## 2015-11-12 DIAGNOSIS — Z72820 Sleep deprivation: Secondary | ICD-10-CM

## 2015-11-12 DIAGNOSIS — R0683 Snoring: Secondary | ICD-10-CM | POA: Diagnosis not present

## 2015-11-12 DIAGNOSIS — Z7689 Persons encountering health services in other specified circumstances: Secondary | ICD-10-CM

## 2015-11-12 DIAGNOSIS — Z319 Encounter for procreative management, unspecified: Secondary | ICD-10-CM | POA: Diagnosis not present

## 2015-11-12 DIAGNOSIS — R519 Headache, unspecified: Secondary | ICD-10-CM | POA: Insufficient documentation

## 2015-11-12 DIAGNOSIS — E291 Testicular hypofunction: Secondary | ICD-10-CM

## 2015-11-12 DIAGNOSIS — Z794 Long term (current) use of insulin: Secondary | ICD-10-CM | POA: Diagnosis not present

## 2015-11-12 DIAGNOSIS — E038 Other specified hypothyroidism: Secondary | ICD-10-CM

## 2015-11-12 DIAGNOSIS — E119 Type 2 diabetes mellitus without complications: Secondary | ICD-10-CM

## 2015-11-12 DIAGNOSIS — R7989 Other specified abnormal findings of blood chemistry: Secondary | ICD-10-CM

## 2015-11-12 DIAGNOSIS — R51 Headache: Secondary | ICD-10-CM

## 2015-11-12 LAB — T4, FREE: Free T4: 1.1 ng/dL (ref 0.8–1.8)

## 2015-11-12 LAB — COMPREHENSIVE METABOLIC PANEL
ALT: 20 U/L (ref 9–46)
AST: 15 U/L (ref 10–40)
Albumin: 4.4 g/dL (ref 3.6–5.1)
Alkaline Phosphatase: 76 U/L (ref 40–115)
BUN: 24 mg/dL (ref 7–25)
CO2: 23 mmol/L (ref 20–31)
Calcium: 9.1 mg/dL (ref 8.6–10.3)
Chloride: 101 mmol/L (ref 98–110)
Creat: 0.97 mg/dL (ref 0.60–1.35)
Glucose, Bld: 185 mg/dL — ABNORMAL HIGH (ref 65–99)
Potassium: 4.3 mmol/L (ref 3.5–5.3)
Sodium: 134 mmol/L — ABNORMAL LOW (ref 135–146)
Total Bilirubin: 0.4 mg/dL (ref 0.2–1.2)
Total Protein: 7.1 g/dL (ref 6.1–8.1)

## 2015-11-12 LAB — TSH: TSH: 24.02 mIU/L — ABNORMAL HIGH (ref 0.40–4.50)

## 2015-11-12 LAB — TESTOSTERONE: Testosterone: 524 ng/dL (ref 250–827)

## 2015-11-12 NOTE — Progress Notes (Signed)
Subjective:     Patient ID: Alejandro Griffin, male   DOB: [birth year], 35 y.o.   MRN: [id number]  HPI  Chief Complaint  Patient presents with  . Headache    monday night. in back of his head. has taken tylenol but is still having sharp pains   Here for c/o headache.   accompanied by wife.  2 days ago sitting at home at night had sharp pains in back of head, occurred out of the blue.   Lasted through the night.  Seemed to be every hour on the hour.   Took 1213m Tylenol that evening.   Got up 4 am when he gets up for work, and was continuing to have headaches.  By lunch time the neck day they had eased off some.   With headaches, denies tearing, runny nose.  Denies drinking a lot of caffeine, typically one soda daily.  In general sleeps ok, but wife says he tosses and turns all night.  Snores horribly, but no witnessed apnea.   No prior sleep study.   No family hx/o sleep apnea.   No morning fatigue, no somnolence in the day.   Not particularly fatigued.  Gets in the bed about 11pm, gets up 4am to start his work day.    Denies numbness or tingling, no weakness, no blurred vision, no slurred speech.    184 glucose during some of the headache.  Didn't check BP.   sometimes gets frontal headaches, but not like this.  No URI symptoms.  They want his thyroid and TST rechecked.  TST was low last year and they desire another baby at this time.   No other aggravating or relieving factors. No other complaint.  Past Medical History:  Diagnosis Date  . Diabetes mellitus without complication ([place]   . Hyperlipidemia   . Hypertension   . Hyperthyroidism   . Inguinal hernia    RT  . Low testosterone   . Obesity   . Vitamin D deficiency    Current Outpatient Prescriptions on File Prior to Visit  Medication Sig Dispense Refill  . acetaminophen (TYLENOL) 500 MG tablet Take 1,000 mg by mouth every 6 (six) hours as needed for moderate pain.    .Marland Kitchenatorvastatin (LIPITOR) 20 MG tablet Take 1 tablet (20 mg total) by  mouth daily. 90 tablet 0  . dapagliflozin propanediol (FARXIGA) 10 MG TABS tablet Take 10 mg by mouth daily. 90 tablet 1  . glucose blood test strip Patient is to test BID   DX: E11.9 This is onetouch ultra test strips 100 each 12  . Insulin Detemir (LEVEMIR FLEXPEN) 100 UNIT/ML Pen Inject 18 Units into the skin daily at 10 pm. 15 mL 1  . levothyroxine (SYNTHROID, LEVOTHROID) 100 MCG tablet TAKE 1 TABLET (100 MCG TOTAL) BY MOUTH DAILY. 90 tablet 1  . lisinopril (PRINIVIL,ZESTRIL) 5 MG tablet TAKE 1 TABLET (5 MG TOTAL) BY MOUTH DAILY. 90 tablet 0  . ONE TOUCH ULTRA TEST test strip TEST SUGARS 2 HOURS AFTER MEALS DAILY 90 each 3  . pantoprazole (PROTONIX) 40 MG tablet Take 1 tablet 30 minutes before breakfast daily 90 tablet 3  . pioglitazone-metformin (ACTOPLUS MET) 15-850 MG tablet TAKE 1 TABLET BY MOUTH TWICE A DAY WITH A MEAL 60 tablet 3   No current facility-administered medications on file prior to visit.     Review of Systems     Objective:   Physical Exam  BP 116/82   Pulse 89   Wt  205 lb (93 kg) Comment: with work boots  BMI 32.11 kg/m  BP Readings from Last 3 Encounters:  11/12/15 116/82  09/05/15 130/80  07/25/15 124/64   Wt Readings from Last 3 Encounters:  11/12/15 205 lb (93 kg)  09/05/15 198 lb (89.8 kg)  07/25/15 198 lb 6.4 oz (90 kg)   General appearance: alert, no distress, WD/WN  HEENT: normocephalic, sclerae anicteric, PERRLA, EOMi, nares patent, no discharge or erythema, pharynx normal, no large tonsils Oral cavity: MMM, no lesions Neck: supple, nontender, normal ROM, no lymphadenopathy, no thyromegaly, no masses, 17.5" circumference Heart: RRR, normal S1, S2, no murmurs Lungs: CTA bilaterally, no wheezes, rhonchi, or rales Extremities: no edema, no cyanosis, no clubbing Pulses: 2+ symmetric, upper and lower extremities, normal cap refill Neurological: alert, oriented x 3, CN2-12 intact, strength normal upper extremities and lower extremities, sensation  normal throughout, DTRs 2+ throughout, no cerebellar signs, gait normal Psychiatric: normal affect, behavior normal, pleasant       Assessment:     Encounter Diagnoses  Name Primary?  . Acute nonintractable headache, unspecified headache type Yes  . Low testosterone   . Other specified hypothyroidism   . Diabetes mellitus, type II, insulin dependent ([place])   . Sleep concern   . Snoring   . Desire for pregnancy        Plan:     Headache etiology unclear.   Discussed the many possible secondary headache causes.   I suspect he was fatigued and maybe a little dehydrated.  He has uncontrolled diabetes, not the best sleep habits, and after a long day at work, came home and worked out side with mowing.     Discussed sleep hygiene.   Wife to be watchful for apnea events.  discussed possible sleep study if headaches continue or if greater concern for apnea.   He has risk factors which include >17" neck diameter, snoring, disrupted sleep.    discussed hydration, avoiding dehydration and avoiding skipping meals given diabetes.  C/t good medication compliance.     If headaches continue, consider scan or sleep study.    counseled on proper dosing of Tylenol and OTC analgesic    Labs today, f/u pending labs.

## 2015-11-13 ENCOUNTER — Other Ambulatory Visit: Payer: Self-pay | Admitting: Medical

## 2015-11-13 MED ORDER — LEVOTHYROXINE SODIUM 112 MCG PO TABS: 112.0000 ug | ORAL_TABLET | Freq: Every day | ORAL | 1 refills | Status: DC

## 2015-12-01 ENCOUNTER — Other Ambulatory Visit: Payer: Self-pay

## 2015-12-01 ENCOUNTER — Other Ambulatory Visit: Payer: Self-pay | Admitting: Family Medicine

## 2015-12-01 ENCOUNTER — Telehealth: Payer: Self-pay | Admitting: Family Medicine

## 2015-12-01 MED ORDER — INSULIN DETEMIR 100 UNIT/ML FLEXPEN: 18.0000 [IU] | PEN_INJECTOR | Freq: Every day | SUBCUTANEOUS | 1 refills | Status: DC

## 2015-12-01 NOTE — Telephone Encounter (Signed)
Pt called and needs a refill on his levemir flexpin [id number] [place], [place] - [address]

## 2015-12-01 NOTE — Telephone Encounter (Signed)
ok 

## 2015-12-01 NOTE — Telephone Encounter (Signed)
Sent in med

## 2015-12-02 ENCOUNTER — Encounter: Payer: Self-pay | Admitting: Medical

## 2016-01-09 ENCOUNTER — Telehealth: Payer: Self-pay | Admitting: Family Medicine

## 2016-01-09 MED ORDER — INSULIN DETEMIR 100 UNIT/ML FLEXPEN: 18.0000 [IU] | PEN_INJECTOR | Freq: Every day | SUBCUTANEOUS | 1 refills | Status: DC

## 2016-01-09 NOTE — Telephone Encounter (Signed)
Sent med into pharmacy 

## 2016-01-09 NOTE — Telephone Encounter (Signed)
Rcvd refill for90 day supply of Levemir Flextouch 100 units/ml

## 2016-01-10 ENCOUNTER — Telehealth: Payer: Self-pay | Admitting: Family Medicine

## 2016-01-10 NOTE — Telephone Encounter (Signed)
Pt was out of insulin.  Pharmacy called office yesterday for refill--apparently they requested a 90d supply (as required by his insurance), but that wasn't what was prescribed.  He is completely out of insulin and needs rx. Pharmacy was trying to contact his insurance to authorize refill for what was called in yest.  Also, he states he is on 40 U, but directions on rx only list 18 U, so not getting enough per rx regardless.  I checked his chart--not documented where he titrated up to such a high dose. He has appt 9/29 with Dr. Redmond School.  I checked with pharmacy to ensure that pt was able to get his insulin.  After checking with pharmacy, they were able to fill what was sent in yest.  Dr. Colonel Bald you see him on 9/29 he will need new rx reflecting correct dosage, and for a 90d supply

## 2016-01-16 ENCOUNTER — Ambulatory Visit (INDEPENDENT_AMBULATORY_CARE_PROVIDER_SITE_OTHER): Payer: Managed Care, Other (non HMO) | Admitting: Family Medicine

## 2016-01-16 ENCOUNTER — Encounter: Payer: Self-pay | Admitting: Family Medicine

## 2016-01-16 ENCOUNTER — Other Ambulatory Visit: Payer: Self-pay

## 2016-01-16 VITALS — BP 116/70 | HR 90 | Ht 67.0 in | Wt 207.0 lb

## 2016-01-16 DIAGNOSIS — I10 Essential (primary) hypertension: Secondary | ICD-10-CM | POA: Diagnosis not present

## 2016-01-16 DIAGNOSIS — E1169 Type 2 diabetes mellitus with other specified complication: Secondary | ICD-10-CM

## 2016-01-16 DIAGNOSIS — Z794 Long term (current) use of insulin: Secondary | ICD-10-CM | POA: Diagnosis not present

## 2016-01-16 DIAGNOSIS — Z23 Encounter for immunization: Secondary | ICD-10-CM | POA: Diagnosis not present

## 2016-01-16 DIAGNOSIS — I152 Hypertension secondary to endocrine disorders: Secondary | ICD-10-CM

## 2016-01-16 DIAGNOSIS — E038 Other specified hypothyroidism: Secondary | ICD-10-CM

## 2016-01-16 DIAGNOSIS — E119 Type 2 diabetes mellitus without complications: Secondary | ICD-10-CM

## 2016-01-16 DIAGNOSIS — E669 Obesity, unspecified: Secondary | ICD-10-CM | POA: Diagnosis not present

## 2016-01-16 DIAGNOSIS — E785 Hyperlipidemia, unspecified: Secondary | ICD-10-CM

## 2016-01-16 DIAGNOSIS — E1159 Type 2 diabetes mellitus with other circulatory complications: Secondary | ICD-10-CM

## 2016-01-16 LAB — LIPID PANEL
Cholesterol: 185 mg/dL (ref 125–200)
HDL: 46 mg/dL (ref >=40)
LDL Cholesterol: 121 mg/dL (ref <130)
Total CHOL/HDL Ratio: 4 Ratio (ref <=5.0)
Triglycerides: 88 mg/dL (ref <150)
VLDL: 18 mg/dL (ref <30)

## 2016-01-16 LAB — POCT GLYCOSYLATED HEMOGLOBIN (HGB A1C): Hemoglobin A1C: 9.5

## 2016-01-16 LAB — TSH: TSH: 16.23 mIU/L — ABNORMAL HIGH (ref 0.40–4.50)

## 2016-01-16 MED ORDER — INSULIN DETEMIR 100 UNIT/ML FLEXPEN: 50.0000 [IU] | PEN_INJECTOR | Freq: Every day | SUBCUTANEOUS | 11 refills | Status: DC

## 2016-01-16 MED ORDER — INSULIN DETEMIR 100 UNIT/ML FLEXPEN: 50.0000 [IU] | PEN_INJECTOR | Freq: Every day | SUBCUTANEOUS | 2 refills | Status: DC

## 2016-01-16 NOTE — Progress Notes (Signed)
Subjective:    Patient ID: Alejandro Griffin, male    DOB: May 19, 1980, 35 y.o.   MRN: [id number]  Alejandro Griffin is a 35 y.o. male who presents for follow-up of Type 2 diabetes mellitus.  Patient is checking home blood sugars.   Home blood sugar records: up and down a lot And he has not looked into this in regard to his eating habits. His wife also cooks some the meals. 2 hour postprandial numbers can be in the mid 200 range. How often is blood sugars being checked: BID he is taking Levemir 41 units per day. Current symptoms/problems just control Daily foot checks: yes  Any foot concerns: none Last eye exam: 07/26/14 Exercise work  He continues on his thyroid medication. He does take it on an empty stomach. He continues on his insulin, Actos plus, for C. difficile and is taking these properly. Continues on lisinopril. He also is on atorvastatin and having no aches and pains. The following portions of the patient's history were reviewed and updated as appropriate: allergies, current medications, past medical history, past social history and problem list.  ROS as in subjective above.     Objective:    Physical Exam Alert and in no distress otherwise not examined.   Lab Review Diabetic Labs Latest Ref Rng & Units 11/12/2015 09/05/2015 04/25/2015 02/14/2015 12/06/2014  HbA1c - - 14 8.5 - 7.3  Microalbumin mg/L - 7.9 - - -  Micro/Creat Ratio - - 8.6 - - -  Chol 0 - 200 mg/dL - - - - -  HDL >=40 mg/dL - - - - -  Calc LDL 0 - 99 mg/dL - - - - -  Triglycerides <150 mg/dL - - - - -  Creatinine 0.60 - 1.35 mg/dL 0.97 - - 1.15 -   BP/Weight 11/12/2015 09/05/2015 07/25/2015 07/02/2015 0000000  Systolic BP [id number]  Diastolic BP 82 80 64 80 70  Wt. (Lbs) 205 198 198.4 - 201.8  BMI 32.11 31 31.07 - 31.6   Foot/eye exam completion dates Latest Ref Rng & Units 07/26/2014 07/19/2014  Eye Exam No Retinopathy No Retinopathy -  Foot Form Completion - - Done  A1c is 9.5  Alejandro Griffin  reports that  he has never smoked. He has never used smokeless tobacco. He reports that he does not drink alcohol or use drugs.     Assessment & Plan:    Need for prophylactic vaccination and inoculation against influenza - Plan: Flu Vaccine QUAD 36+ mos IM  Diabetes mellitus, type II, insulin dependent ([place]) - Plan: HgB A1c, Ambulatory referral to Ophthalmology, Amb Referral to Nutrition and Diabetic E, DISCONTINUED: Insulin Detemir (LEVEMIR) 100 UNIT/ML Pen  Obesity (BMI 30-39.9)  Hyperlipidemia associated with type 2 diabetes mellitus ([place]) - Plan: Lipid panel  Hypertension associated with diabetes ([place])  Other specified hypothyroidism - Plan: TSH   1. Rx changes: none 2. Education: Reviewed 'ABCs' of diabetes management (respective goals in parentheses):  A1C (<7), blood pressure (<130/80), and cholesterol (LDL <100). 3. Compliance at present is estimated to be poor. Efforts to improve compliance (if necessary) will be directed at diet and exercise as well as increasing his insulin by 2 units every 2 days until his blood sugar is under 120. He will also be referred to diabetes education as well as with his wife. Hopefully this will help.Alejandro Griffin also need follow-up on his TSH as he is not at goal concerning his thyroid function. 4. Follow up: 4 months

## 2016-01-16 NOTE — Patient Instructions (Signed)
Increase the Levemir by 2 units every 2 days until your blood sugars under 120 in the morning.

## 2016-01-18 MED ORDER — LEVOTHYROXINE SODIUM 125 MCG PO TABS: 125.0000 ug | ORAL_TABLET | Freq: Every day | ORAL | 3 refills | Status: DC

## 2016-01-18 NOTE — Addendum Note (Signed)
Addended by: Denita Lung on: 01/18/2016 01:44 PM   Modules accepted: Orders

## 2016-01-19 ENCOUNTER — Telehealth: Payer: Self-pay

## 2016-01-19 ENCOUNTER — Other Ambulatory Visit: Payer: Self-pay

## 2016-01-19 ENCOUNTER — Other Ambulatory Visit: Payer: Self-pay | Admitting: Family Medicine

## 2016-01-19 MED ORDER — GLUCOSE BLOOD VI STRP: ORAL_STRIP | 3 refills | Status: DC

## 2016-01-19 NOTE — Telephone Encounter (Signed)
Request 90 day supply of one touch ultra strips.

## 2016-01-19 NOTE — Telephone Encounter (Signed)
done

## 2016-01-26 ENCOUNTER — Other Ambulatory Visit: Payer: Self-pay | Admitting: Family Medicine

## 2016-01-26 DIAGNOSIS — Z794 Long term (current) use of insulin: Principal | ICD-10-CM

## 2016-01-26 DIAGNOSIS — E119 Type 2 diabetes mellitus without complications: Secondary | ICD-10-CM

## 2016-01-30 LAB — HM DIABETES EYE EXAM

## 2016-02-04 ENCOUNTER — Encounter: Payer: Self-pay | Admitting: Family Medicine

## 2016-03-26 ENCOUNTER — Encounter: Payer: Self-pay | Admitting: Medical

## 2016-03-26 ENCOUNTER — Ambulatory Visit (INDEPENDENT_AMBULATORY_CARE_PROVIDER_SITE_OTHER): Payer: Managed Care, Other (non HMO) | Admitting: Medical

## 2016-03-26 VITALS — BP 130/98 | HR 94 | Temp 98.0°F | Wt 213.4 lb

## 2016-03-26 DIAGNOSIS — Z7689 Persons encountering health services in other specified circumstances: Secondary | ICD-10-CM

## 2016-03-26 DIAGNOSIS — R0683 Snoring: Secondary | ICD-10-CM

## 2016-03-26 DIAGNOSIS — I10 Essential (primary) hypertension: Secondary | ICD-10-CM

## 2016-03-26 DIAGNOSIS — R5382 Chronic fatigue, unspecified: Secondary | ICD-10-CM

## 2016-03-26 DIAGNOSIS — E669 Obesity, unspecified: Secondary | ICD-10-CM

## 2016-03-26 DIAGNOSIS — R400 Somnolence: Secondary | ICD-10-CM | POA: Insufficient documentation

## 2016-03-26 DIAGNOSIS — J320 Chronic maxillary sinusitis: Secondary | ICD-10-CM | POA: Diagnosis not present

## 2016-03-26 DIAGNOSIS — E1159 Type 2 diabetes mellitus with other circulatory complications: Secondary | ICD-10-CM

## 2016-03-26 MED ORDER — AMOXICILLIN 500 MG PO TABS: ORAL_TABLET | ORAL | 0 refills | Status: DC

## 2016-03-26 NOTE — Progress Notes (Signed)
Subjective:  Alejandro Griffin is a 35 y.o. male who presents for possible sinus infection.  He reports a week and a half of feeling bad.  He reports soreness in maxillary sinuses, getting green sinus drainage.  Some cough, post nasal drainage.  No fever.  Had some chills few nights ago.   No NVD.  Using dayquil.  Nonsmoker.   Has sick contact in the house.    Snores.  Doesn't always awake rested, no witnessed apnea.  Does report fatigue all the time.  He and wife discussed this and he may want sleep study as we had mentioned on prior visit.    No other aggravating or relieving factors.  No other c/o.  Past Medical History:  Diagnosis Date  . Diabetes mellitus without complication ([place])   . Hyperlipidemia   . Hypertension   . Hyperthyroidism   . Inguinal hernia    RT  . Low testosterone   . Obesity   . Vitamin D deficiency     ROS as in subjective   Objective: BP (!) 130/98   Pulse 94   Temp 98 F (36.7 C)   Wt 213 lb 6.4 oz (96.8 kg)   SpO2 96%   BMI 33.42 kg/m   BP Readings from Last 3 Encounters:  03/26/16 (!) 130/98  01/16/16 116/70  11/12/15 116/82   Wt Readings from Last 3 Encounters:  03/26/16 213 lb 6.4 oz (96.8 kg)  01/16/16 207 lb (93.9 kg)  11/12/15 205 lb (93 kg)   General appearance: Alert, WD/WN, no distress                             Skin: warm, no rash                           Head: + maxillary sinus tenderness,                            Eyes: conjunctiva normal, corneas clear, PERRLA                            Ears: mucoid fluid behind TMs, external ear canals normal                          Nose: septum midline, turbinates swollen, with erythema and mucoid discharge             Mouth/throat: MMM, tongue normal, mild pharyngeal erythema                           Neck: supple, no adenopathy, no thyromegaly, non tender                          Heart: RRR, normal S1, S2, no murmurs                         Lungs: CTA bilaterally, no wheezes, rales,  or rhonchi       Assessment  Encounter Diagnoses  Name Primary?  . Maxillary sinusitis, unspecified chronicity Yes  . Snoring   . Chronic fatigue   . Obesity (BMI 30-39.9)   . Sleep concern   . Hypertension associated with diabetes ([place])   .  Daytime somnolence      Plan: Begin Amoxicillin, rest, hydrate well, consider neti pot, discussed OTC medication for supportive care  He will call back after checking insurance coverage for sleep study.   Alejandro Griffin was seen today for sinsus.  Diagnoses and all orders for this visit:  Maxillary sinusitis, unspecified chronicity  Snoring  Chronic fatigue  Obesity (BMI 30-39.9)  Sleep concern  Hypertension associated with diabetes ([place])  Daytime somnolence  Patient was advised to call or return if worse or not improving in the next few days.    Patient voiced understanding of diagnosis, recommendations, and treatment plan.

## 2016-04-18 ENCOUNTER — Other Ambulatory Visit: Payer: Self-pay | Admitting: Family Medicine

## 2016-04-20 ENCOUNTER — Telehealth: Payer: Self-pay

## 2016-04-20 ENCOUNTER — Other Ambulatory Visit: Payer: Self-pay

## 2016-04-20 MED ORDER — PIOGLITAZONE HCL-METFORMIN HCL 15-850 MG PO TABS: ORAL_TABLET | ORAL | 1 refills | Status: DC

## 2016-04-20 NOTE — Telephone Encounter (Signed)
90 day supply sent in actosplus

## 2016-04-20 NOTE — Telephone Encounter (Signed)
Request refill of actoplus met for 90 day supply to [facility] [address] Alejandro Griffin

## 2016-04-28 ENCOUNTER — Other Ambulatory Visit: Payer: Self-pay | Admitting: Family Medicine

## 2016-05-21 ENCOUNTER — Ambulatory Visit: Payer: Managed Care, Other (non HMO) | Admitting: Family Medicine

## 2016-05-21 ENCOUNTER — Ambulatory Visit (INDEPENDENT_AMBULATORY_CARE_PROVIDER_SITE_OTHER): Payer: Managed Care, Other (non HMO) | Admitting: Medical

## 2016-05-21 ENCOUNTER — Encounter: Payer: Self-pay | Admitting: Medical

## 2016-05-21 VITALS — BP 110/80 | HR 107 | Temp 97.6°F | Wt 210.6 lb

## 2016-05-21 DIAGNOSIS — R05 Cough: Secondary | ICD-10-CM | POA: Diagnosis not present

## 2016-05-21 DIAGNOSIS — J40 Bronchitis, not specified as acute or chronic: Secondary | ICD-10-CM | POA: Diagnosis not present

## 2016-05-21 DIAGNOSIS — J329 Chronic sinusitis, unspecified: Secondary | ICD-10-CM | POA: Diagnosis not present

## 2016-05-21 DIAGNOSIS — R059 Cough, unspecified: Secondary | ICD-10-CM

## 2016-05-21 MED ORDER — HYDROCODONE-HOMATROPINE 5-1.5 MG/5ML PO SYRP: 5.0000 mL | ORAL_SOLUTION | Freq: Three times a day (TID) | ORAL | 0 refills | Status: DC | PRN

## 2016-05-21 MED ORDER — LEVOFLOXACIN 500 MG PO TABS: 500.0000 mg | ORAL_TABLET | Freq: Every day | ORAL | 0 refills | Status: DC

## 2016-05-21 MED ORDER — MOMETASONE FURO-FORMOTEROL FUM 100-5 MCG/ACT IN AERO: 2.0000 | INHALATION_SPRAY | Freq: Two times a day (BID) | RESPIRATORY_TRACT | 0 refills | Status: DC

## 2016-05-21 NOTE — Progress Notes (Signed)
Subjective: Chief Complaint  Patient presents with  . coughing, congestion , sore throat    coughing, congestion , sore throat   Here for illness.  [facility] [birth year] for current symptoms.   Was put on Augmentin for respiratory infection/sinus mucous.   Been having symptoms since around 05/04/16 with sinus pressure, cough, sore throat, green and yellow colored mucous blowing nose.   Denies fever, no NVD.  At times some SOB.   Wife has been sick with sinus infection and she just finished and antibiotic.   Using Dayquil and robitussin.  No other aggravating or relieving factors. No other complaint.  Past Medical History:  Diagnosis Date  . Diabetes mellitus without complication ([place])   . Hyperlipidemia   . Hypertension   . Hyperthyroidism   . Inguinal hernia    RT  . Low testosterone   . Obesity   . Vitamin D deficiency    Current Outpatient Prescriptions on File Prior to Visit  Medication Sig Dispense Refill  . acetaminophen (TYLENOL) 500 MG tablet Take 1,000 mg by mouth every 6 (six) hours as needed for moderate pain.    Marland Kitchen atorvastatin (LIPITOR) 20 MG tablet Take 1 tablet (20 mg total) by mouth daily. 90 tablet 0  . glucose blood test strip Patient is to test BID   DX: E11.9 This is onetouch ultra test strips 200 each 3  . Insulin Detemir (LEVEMIR) 100 UNIT/ML Pen Inject 50 Units into the skin daily. 45 mL 2  . levothyroxine (SYNTHROID, LEVOTHROID) 125 MCG tablet Take 1 tablet (125 mcg total) by mouth daily. 90 tablet 3  . lisinopril (PRINIVIL,ZESTRIL) 5 MG tablet TAKE 1 TABLET (5 MG TOTAL) BY MOUTH DAILY. 90 tablet 1  . ONE TOUCH ULTRA TEST test strip TEST SUGARS 2 HOURS AFTER MEALS DAILY 90 each 3  . pantoprazole (PROTONIX) 40 MG tablet Take 1 tablet 30 minutes before breakfast daily 90 tablet 3  . pioglitazone-metformin (ACTOPLUS MET) 15-850 MG tablet TAKE 1 TABLET BY MOUTH TWICE A DAY WITH A MEAL 180 tablet 1   No current facility-administered medications on file  prior to visit.    ROS as in subjective   Objective: BP 110/80   Pulse (!) 107   Temp 97.6 F (36.4 C)   Wt 210 lb 9.6 oz (95.5 kg)   SpO2 97%   BMI 32.98 kg/m   General appearance: Alert, WD/WN, no distress                             Skin: warm, no rash                           Head: +maxillary sinus tenderness,                            Eyes: conjunctiva normal, corneas clear, PERRLA                            Ears:right TM with yellowish effusion, erythema of left TM, external ear canals normal                          Nose: septum midline, turbinates swollen, with erythema and mucous discharge  Mouth/throat: MMM, tongue normal, mild pharyngeal erythema                           Neck: supple, no adenopathy, no thyromegaly, non tender                        Lungs: CTA bilaterally, no wheezes, rales, or rhonchi       Assessment: Encounter Diagnoses  Name Primary?  . Sinobronchitis Yes  . Cough       Plan: Advised rest, hydration, c/t OTC Robitussin DM, medications below, and if not completely resolved within 7-10 days let me know.  Gave sample Dulera, discussed proper use and he did first 2 puffs now.  Yanis was seen today for coughing, congestion , sore throat.  Diagnoses and all orders for this visit:  Sinobronchitis  Cough  Other orders -     levofloxacin (LEVAQUIN) 500 MG tablet; Take 1 tablet (500 mg total) by mouth daily. -     HYDROcodone-homatropine (HYCODAN) 5-1.5 MG/5ML syrup; Take 5 mLs by mouth every 8 (eight) hours as needed for cough. -     mometasone-formoterol (DULERA) 100-5 MCG/ACT AERO; Inhale 2 puffs into the lungs 2 (two) times daily.

## 2016-05-27 ENCOUNTER — Other Ambulatory Visit: Payer: Self-pay | Admitting: Family Medicine

## 2016-07-21 ENCOUNTER — Other Ambulatory Visit: Payer: Self-pay | Admitting: Internal Medicine

## 2016-07-21 DIAGNOSIS — R112 Nausea with vomiting, unspecified: Secondary | ICD-10-CM

## 2016-08-03 ENCOUNTER — Encounter: Payer: Self-pay | Admitting: Family Medicine

## 2016-08-03 ENCOUNTER — Ambulatory Visit: Payer: 59 | Admitting: *Deleted

## 2016-08-03 ENCOUNTER — Other Ambulatory Visit: Payer: Self-pay

## 2016-08-03 MED ORDER — PANTOPRAZOLE SODIUM 40 MG PO TBEC: DELAYED_RELEASE_TABLET | ORAL | 3 refills | Status: DC

## 2016-08-17 ENCOUNTER — Encounter: Payer: Managed Care, Other (non HMO) | Attending: Internal Medicine | Admitting: *Deleted

## 2016-08-17 DIAGNOSIS — E109 Type 1 diabetes mellitus without complications: Secondary | ICD-10-CM

## 2016-08-24 NOTE — Progress Notes (Signed)
Insulin Pump and / or CGM Evaluation Visit:  Date: 08/17/2016   Appt start time: 1400 end time:  1530.  Assessment:  This patient has DM 1 and their primary concerns today: learn more about pump options and CGM.  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, music Patient states knowledge of Carb Counting is 3 on a scale of 1-10 Patient states they are currently testing BG more than 4 times per day Last A1c was 8.7%   MEDICATIONS: Basal Insulin: 14 units of Levemir twice daily Bolus Insulin: 8 units of Novolog plus a sliding scale at each meal Total Daily Dose of insulin per day 64 Other diabetes medications: no  Progress Towards Obtaining an Insulin Pump Goal(s):   Patient states their expectations of pump therapy include: better control and looking for something without tubing that could get caught when he is climbing around doing his electrical  Patient expresses understanding that for improved outcomes for their diabetes on an insulin pump they will:  Change out pump infusion set at least every 3 days  Upload pump information to their pump's Web Based Program on a regular basis so provider can assess patterns and make setting adjustments.     Intervention:    Taught difference between delivery of insulin via syringe/pen compared to insulin pump.  Demonstrated improved insulin delivery via pump due to improved accuracy of dose and flexibility of adjusting bolus insulin based on carb intake and BG correction.  Showed patient the following pumps: Medtronic, OmniPod,   Showed patient the following CGM: Medtronic, Dexcom, Libre  Demonstrated pump, insulin reservoir and infusion set options, and button pushing for bolus delivery of insulin through the pump  Explained importance of testing BG at least 4 times per day for appropriate correction of high BG and prevention of DKA as applicable.  Emphasized importance of follow up after Pump Start for  appropriate pump setting adjustments and on-going training on more advanced features.  Discussed current Continuous Glucose Monitoring options   Handouts given during visit include:  Introduction to Pump Therapy Handout  DM 1/Pump Support Group Flyer  Insulin Pump and /or CGM Packet from [employer] and Medtronic  Carb Counting handout   Monitoring/Evaluation:    Patient does  want to continue with pursuit of  insulin pump eventually but wants to think about it for now  Plan follow up to continue discussion in 1 month.

## 2016-09-21 ENCOUNTER — Encounter: Payer: Managed Care, Other (non HMO) | Attending: Internal Medicine | Admitting: *Deleted

## 2016-09-21 DIAGNOSIS — Z794 Long term (current) use of insulin: Secondary | ICD-10-CM

## 2016-09-21 DIAGNOSIS — E109 Type 1 diabetes mellitus without complications: Secondary | ICD-10-CM | POA: Diagnosis not present

## 2016-09-21 DIAGNOSIS — E119 Type 2 diabetes mellitus without complications: Secondary | ICD-10-CM

## 2016-09-21 NOTE — Progress Notes (Signed)
Insulin Pump Evaluation Follow up Visit:  Date: 09/21/2016   Appt start time: 1600 end time:  1630.  Assessment:  This patient has DM 2 and their primary concerns today: he has considered his pump options and would like to pursue Omni Pod. 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 The OmniPod Rep happened to come by my office during this visit time, so they met and initiated the pump process. Omni Pod will contact insurance company and let patient know the out of pocket expense before shipping the pump.    Monitoring/Evaluation:    Patient to contact this office when pump is shipped to set up training date, unless he wants the [place] trainer to do so.   Patient aware of DM 1/Pump Support Group for ongoing education and support

## 2016-10-30 ENCOUNTER — Other Ambulatory Visit: Payer: Self-pay | Admitting: Family Medicine

## 2016-11-12 ENCOUNTER — Encounter: Payer: Self-pay | Admitting: Medical

## 2016-11-12 ENCOUNTER — Ambulatory Visit (INDEPENDENT_AMBULATORY_CARE_PROVIDER_SITE_OTHER): Payer: 59 | Admitting: Medical

## 2016-11-12 VITALS — BP 130/82 | HR 94 | Temp 98.1°F | Resp 18 | Wt 214.6 lb

## 2016-11-12 DIAGNOSIS — J988 Other specified respiratory disorders: Secondary | ICD-10-CM

## 2016-11-12 DIAGNOSIS — Z794 Long term (current) use of insulin: Secondary | ICD-10-CM

## 2016-11-12 DIAGNOSIS — E119 Type 2 diabetes mellitus without complications: Secondary | ICD-10-CM | POA: Diagnosis not present

## 2016-11-12 MED ORDER — CLARITHROMYCIN 500 MG PO TABS: 500.0000 mg | ORAL_TABLET | Freq: Two times a day (BID) | ORAL | 0 refills | Status: DC

## 2016-11-12 MED ORDER — HYDROCODONE-HOMATROPINE 5-1.5 MG/5ML PO SYRP: 5.0000 mL | ORAL_SOLUTION | Freq: Three times a day (TID) | ORAL | 0 refills | Status: DC | PRN

## 2016-11-12 NOTE — Progress Notes (Signed)
Subjective: Chief Complaint  Patient presents with  . Cough    onset 1 week ago. no other sx.    Here for cough, some headache with cough, x 1 week.   Has some sore throat.  No ear pain.   Has some greenish sputum.  No hemoptysis.   No fever.   No NVD.  Has been around niece with nasty cold and double ear infection.   Using mucinex OTC, robitussin DM.  He is a diabetic.  No other aggravating or relieving factors. No other complaint.  Past Medical History:  Diagnosis Date  . Diabetes mellitus without complication ([place])   . Hyperlipidemia   . Hypertension   . Hyperthyroidism   . Inguinal hernia    RT  . Low testosterone   . Obesity   . Vitamin D deficiency    Current Outpatient Prescriptions on File Prior to Visit  Medication Sig Dispense Refill  . acetaminophen (TYLENOL) 500 MG tablet Take 1,000 mg by mouth every 6 (six) hours as needed for moderate pain.    Marland Kitchen atorvastatin (LIPITOR) 20 MG tablet TAKE 1 TABLET (20 MG TOTAL) BY MOUTH DAILY. 90 tablet 0  . glucose blood test strip Patient is to test BID   DX: E11.9 This is onetouch ultra test strips 200 each 3  . lisinopril (PRINIVIL,ZESTRIL) 5 MG tablet TAKE 1 TABLET (5 MG TOTAL) BY MOUTH DAILY. 90 tablet 1  . ONE TOUCH ULTRA TEST test strip TEST SUGARS 2 HOURS AFTER MEALS DAILY 90 each 3  . pantoprazole (PROTONIX) 40 MG tablet Take 1 tablet 30 minutes before breakfast daily 90 tablet 3   No current facility-administered medications on file prior to visit.     ROS as in subjective  Objective: BP 130/82   Pulse 94   Temp 98.1 F (36.7 C) (Oral)   Resp 18   Wt 214 lb 9.6 oz (97.3 kg)   SpO2 97%   BMI 33.61 kg/m   General appearance: Alert, WD/WN, no distress, mildly ill appearing                             Skin: warm, no rash, no diaphoresis                           Head: no sinus tenderness                            Eyes: conjunctiva normal, corneas clear, PERRLA                            Ears: pearly TMs, external  ear canals normal                          Nose: septum midline, turbinates swollen, with erythema and clear discharge             Mouth/throat: MMM, tongue normal, mild pharyngeal erythema                           Neck: supple, no adenopathy, no thyromegaly, non tender                          Heart: RRR, normal S1, S2, no murmurs  Lungs: +bronchial breath sounds, +scattered rhonchi, no wheezes, no rales                Extremities: no edema, non tender       Assessment: Encounter Diagnoses  Name Primary?  Marland Kitchen Respiratory tract infection Yes  . Diabetes mellitus, type II, insulin dependent ([place])      Plan: Advised rest, hydration, medications below, can c/t Mucinex OTC.   Hold statin while on Biaxin.   If not much improved within next 3-4 days, then call back.   Alejandro Griffin was seen today for cough.  Diagnoses and all orders for this visit:  Respiratory tract infection  Diabetes mellitus, type II, insulin dependent ([place])  Other orders -     HYDROcodone-homatropine (HYCODAN) 5-1.5 MG/5ML syrup; Take 5 mLs by mouth every 8 (eight) hours as needed for cough. -     clarithromycin (BIAXIN) 500 MG tablet; Take 1 tablet (500 mg total) by mouth 2 (two) times daily.

## 2016-11-17 ENCOUNTER — Ambulatory Visit (INDEPENDENT_AMBULATORY_CARE_PROVIDER_SITE_OTHER): Payer: 59 | Admitting: Family Medicine

## 2016-11-17 ENCOUNTER — Encounter: Payer: Self-pay | Admitting: Family Medicine

## 2016-11-17 VITALS — BP 130/86 | HR 88 | Temp 99.9°F | Ht 67.0 in | Wt 216.2 lb

## 2016-11-17 DIAGNOSIS — R05 Cough: Secondary | ICD-10-CM | POA: Diagnosis not present

## 2016-11-17 DIAGNOSIS — J45901 Unspecified asthma with (acute) exacerbation: Secondary | ICD-10-CM

## 2016-11-17 DIAGNOSIS — R059 Cough, unspecified: Secondary | ICD-10-CM

## 2016-11-17 DIAGNOSIS — E109 Type 1 diabetes mellitus without complications: Secondary | ICD-10-CM | POA: Insufficient documentation

## 2016-11-17 MED ORDER — BENZONATATE 200 MG PO CAPS: 200.0000 mg | ORAL_CAPSULE | Freq: Three times a day (TID) | ORAL | 0 refills | Status: DC | PRN

## 2016-11-17 MED ORDER — ALBUTEROL SULFATE HFA 108 (90 BASE) MCG/ACT IN AERS: 2.0000 | INHALATION_SPRAY | Freq: Four times a day (QID) | RESPIRATORY_TRACT | 1 refills | Status: DC | PRN

## 2016-11-17 MED ORDER — ALBUTEROL SULFATE (2.5 MG/3ML) 0.083% IN NEBU
2.5000 mg | INHALATION_SOLUTION | Freq: Once | RESPIRATORY_TRACT | Status: AC
  Administered 2016-11-17: 2.5 mg via RESPIRATORY_TRACT

## 2016-11-17 NOTE — Progress Notes (Signed)
Chief Complaint  Patient presents with  . Cough    x10 days. Saw Shane on 11/13/15. Cough is not productive and states that the meds that he was given do not help-takes a while to get to sleep from the coughing. Throat is raw from coughing so much,    Seen 5 days ago by Audelia Acton, after a week of symptoms. Treated with clarithromycin and hycodan syrup, as well as Mucinex.  Cough is no longer productive of any discolored mucus.  It is dry and quite bothersome.  Coughing so much he couldn't eat dinner.  Cough isn't related to exertion, exercise, laughing/talking.  Cough doesn't keep him up at night--essential oils on the chest help. He had an episode of post-tussive emesis last night, almost one today.  He continues to use Robitussin (took today, took Mucinex yesterday, had been taking that every 12 hours).  Hycodan syrup isn't helpful (still has some left, last used it last night) He has been taking the antibiotic twice daily, but not yet today.  Denies any fever/chills (not aware of lowgrade temp he has now).  PMH, PSH, SH reviewed  Sees Dr. Buddy Duty for his diabetes. Last A1c 8.9, per pt Put on pump a month ago. Reports his sugars have not been very high recently, during illness Dr. Buddy Duty also monitors his hypothyroidism--thyroid dose was recently increased  Outpatient Encounter Prescriptions as of 11/17/2016  Medication Sig Note  . acetaminophen (TYLENOL) 500 MG tablet Take 1,000 mg by mouth every 6 (six) hours as needed for moderate pain. 11/17/2016: Using for headaches with coughing  . atorvastatin (LIPITOR) 20 MG tablet TAKE 1 TABLET (20 MG TOTAL) BY MOUTH DAILY. 11/17/2016: Stopped temporarily while on abx  . Continuous Blood Gluc Sensor (FREESTYLE LIBRE SENSOR SYSTEM) MISC APPLY ONE SENSOR TO BACK OF UPPER ARM EVERY 10 DAYS   . glucose blood test strip Patient is to test BID   DX: E11.9 This is onetouch ultra test strips   . HYDROcodone-homatropine (HYCODAN) 5-1.5 MG/5ML syrup Take 5 mLs by mouth  every 8 (eight) hours as needed for cough. 11/17/2016: Last dose last night, didn't help  . levothyroxine (SYNTHROID, LEVOTHROID) 175 MCG tablet TAKE 1 TABLET BY MOUTH EVERY DAY IN THE MORNING ON EMPTY STOMACH   . lisinopril (PRINIVIL,ZESTRIL) 5 MG tablet TAKE 1 TABLET (5 MG TOTAL) BY MOUTH DAILY.   Marland Kitchen NOVOLOG FLEXPEN 100 UNIT/ML FlexPen  11/17/2016: pump  . ONE TOUCH ULTRA TEST test strip TEST SUGARS 2 HOURS AFTER MEALS DAILY   . pantoprazole (PROTONIX) 40 MG tablet Take 1 tablet 30 minutes before breakfast daily   . albuterol (PROVENTIL HFA;VENTOLIN HFA) 108 (90 Base) MCG/ACT inhaler Inhale 2 puffs into the lungs every 6 (six) hours as needed for wheezing or shortness of breath.   . benzonatate (TESSALON) 200 MG capsule Take 1 capsule (200 mg total) by mouth 3 (three) times daily as needed for cough.   . clarithromycin (BIAXIN) 500 MG tablet Take 1 tablet (500 mg total) by mouth 2 (two) times daily. (Patient not taking: Reported on 11/17/2016) 11/17/2016: Hasn't taken today's dose yet  . [EXPIRED] albuterol (PROVENTIL) (2.5 MG/3ML) 0.083% nebulizer solution 2.5 mg     No facility-administered encounter medications on file as of 11/17/2016.    (albuterol and tessalon rx'd today, not using prior to visit)  BP 130/86 (BP Location: Left Arm, Patient Position: Sitting, Cuff Size: Normal)   Pulse 88   Temp 99.9 F (37.7 C) (Tympanic)   Ht 5\' 7"  (1.702  m)   Wt 216 lb 3.2 oz (98.1 kg)   BMI 33.86 kg/m   Well appearing ,pleasant male, with frequent spasms of dry cough.  He is speaking easily in full sentences, mainly coughing with deep breaths. He is in no distress HEENT: PERRL, EOMI, conjunctiva and sclera are clear.  Nasal mucosa is mildly edematous, no erythema, purulence or drainage. Sinuses are nontender TM's are slightly white--no bulging OP is clear Neck: no lymphadenopathy or mass Heart: regular rate and rhythm Lungs: slightly decreased air movement at bases.  No wheezes, rales or rhonchi After  nebulizer treatment--no further coughing. Able to take deep breaths without coughing Air movement improved, still clear  ASSESSMENT/PLAN:  Asthmatic bronchitis with acute exacerbation, unspecified asthma severity, unspecified whether persistent - complete biaxin. use albuterol prn. f/u if persistent fever, cough, worsening sx - Plan: albuterol (PROVENTIL HFA;VENTOLIN HFA) 108 (90 Base) MCG/ACT inhaler  Type 1 diabetes mellitus without complications ([place]) - history reviewed.  Control improving, currently has pump. Will try and use albuterol rather than oral steroids due to his DM  Cough - Plan: albuterol (PROVENTIL) (2.5 MG/3ML) 0.083% nebulizer solution 2.5 mg, benzonatate (TESSALON) 200 MG capsule  Responded nicely to nebulizer treatment. Instructed on proper technique for inhaler, side effects. Reviewed OTC meds.  Monitor temps   Schedule CPE next available  F/u sooner prn.

## 2016-11-17 NOTE — Patient Instructions (Signed)
Complete the course of clarithromycin. Use the albuterol inhaler as directed--2 puffs every 6 hours for the next day or two, then just as needed.  Hopefully you won't need this often.  The symptoms you should use it for is any chest tightness, dry/hacky cough, shortness of breath. You may also use the benzonatate pill for cough, if needed  Contact us if you have persistent cough, fever, if you develop recurrence of discolored mucus or phlegm, or other concerns.

## 2016-11-19 ENCOUNTER — Encounter: Payer: Self-pay | Admitting: Family Medicine

## 2016-11-21 ENCOUNTER — Other Ambulatory Visit: Payer: Self-pay | Admitting: Family Medicine

## 2016-12-01 ENCOUNTER — Encounter: Payer: Self-pay | Admitting: Family Medicine

## 2016-12-02 NOTE — Progress Notes (Signed)
Chief Complaint  Patient presents with  . Cough    cough is lingering from URI.   Marland Kitchen Rash    on right arm and leg- worked in yard 2 weeks ago.    Patient presents for evaluation of rash and persistent cough  Seen by Audelia Acton 7/27 after a week of upper respiratory symptoms. Treated with clarithromycin and hycodan syrup, as well as Mucinex.  Cough was no longer productive, became dry and quite bothersome.  He was seen by me on 8/1.  At that point he was coughing so much he couldn't eat and had some post-tussive emesis.  Cough wasn't related to exertion, exercise.  Cough doesn't keep him up at night--essential oils on the chest helped.  He was given albuterol nebulizer treatment in office which was helpful, so he was prescribed albuterol albuterol, as well as tessalon. He reports that he was back to coughing by the time he reached his car (benefit from neb was very temporary). The tessalon has helped some, only takes at night for fear of sedation.  He has been using the albuterol inhaler, and thinks it helps.  Sometimes he couldn't hold in the medication, coughs it right out.   Denies shortness of breath. Sometimes he coughs so hard he thinks he may pass out.  No further post-tussive emesis.   He developed a rash 2 weeks ago on his right arm.  It was itchy.  Over the last week he noticed spread to his side/trunk and his right leg.  He has been doing yardwork (not recently, but prior to onset of the initial rash). Had some rash on abdomen earlier in the week, none now.  PMH, PSH, SH reviewed  Outpatient Encounter Prescriptions as of 12/03/2016  Medication Sig Note  . acetaminophen (TYLENOL) 500 MG tablet Take 1,000 mg by mouth every 6 (six) hours as needed for moderate pain. 11/17/2016: Using for headaches with coughing  . albuterol (PROVENTIL HFA;VENTOLIN HFA) 108 (90 Base) MCG/ACT inhaler Inhale 2 puffs into the lungs every 6 (six) hours as needed for wheezing or shortness of breath.   Marland Kitchen atorvastatin  (LIPITOR) 20 MG tablet TAKE 1 TABLET (20 MG TOTAL) BY MOUTH DAILY. 11/17/2016: Stopped temporarily while on abx  . levothyroxine (SYNTHROID, LEVOTHROID) 175 MCG tablet TAKE 1 TABLET BY MOUTH EVERY DAY IN THE MORNING ON EMPTY STOMACH   . lisinopril (PRINIVIL,ZESTRIL) 5 MG tablet TAKE 1 TABLET (5 MG TOTAL) BY MOUTH DAILY.   Marland Kitchen NOVOLOG FLEXPEN 100 UNIT/ML FlexPen  11/17/2016: pump  . ONE TOUCH ULTRA TEST test strip TEST SUGARS 2 HOURS AFTER MEALS DAILY   . pantoprazole (PROTONIX) 40 MG tablet Take 1 tablet 30 minutes before breakfast daily   . benzonatate (TESSALON) 200 MG capsule Take 1 capsule (200 mg total) by mouth 3 (three) times daily as needed for cough. (Patient not taking: Reported on 12/03/2016)   . Continuous Blood Gluc Sensor (FREESTYLE LIBRE SENSOR SYSTEM) MISC APPLY ONE SENSOR TO BACK OF UPPER ARM EVERY 10 DAYS   . glucose blood test strip Patient is to test BID   DX: E11.9 This is onetouch ultra test strips   . HYDROcodone-homatropine (HYCODAN) 5-1.5 MG/5ML syrup Take 5 mLs by mouth every 8 (eight) hours as needed for cough. (Patient not taking: Reported on 12/03/2016) 11/17/2016: Last dose last night, didn't help  . levofloxacin (LEVAQUIN) 750 MG tablet Take 1 tablet (750 mg total) by mouth daily.   . methylPREDNISolone (MEDROL DOSEPAK) 4 MG TBPK tablet Use as directed   . [  DISCONTINUED] clarithromycin (BIAXIN) 500 MG tablet Take 1 tablet (500 mg total) by mouth 2 (two) times daily. (Patient not taking: Reported on 11/17/2016) 11/17/2016: Hasn't taken today's dose yet   No facility-administered encounter medications on file as of 12/03/2016.    (levaquin and medrol dosepak rx'd today, not prior to visit).  No Known Allergies  ROS:  No fever, chills, sinus pain, ear pain, sore throat, chest pain. +dry cough. No shortness of breath. No nausea, vomiting, diarrhea. No bleeding, bruising.  Itchy rash on right forearm and leg x 2 weeks as per HPI. Normal mood, vision, no urinary symptoms, joint/muscle  pains, or other concerns. See HPI.  PHYSICAL EXAM:  BP 122/80   Pulse 94   Temp 98.5 F (36.9 C) (Oral)   Resp 18   Wt 210 lb 12.8 oz (95.6 kg)   SpO2 96%   BMI 33.02 kg/m   Pleasant male, with frequent spasms of dry cough, worse when taking deep breaths. Able to speak easily in full sentences, in no distress. HEENT: conjunctiva and sclera are clear, EOMI.  Nasal mucosa--mild edema with slight white mucus on the right, clear on the left. sinuses nontender. OP clear--no erythema, lesions, moist mucus membranes Neck: no lymphadenopathy or mass Heart: regular rate and rhythm, no murmur Lungs: clear bilaterally.  Any deeper breath caused dry hacky cough.  [place]ower breath sounds were normal. No wheezing, rales, ronchi Chest: nontender Abdomen: nontender Extremities: no edema, 2+ pulses, brisk cap refill Neuro: alert and oriented, cranial nerves intact, normal gait Skin: excoriated linear lesion as well as some patches and papules on right forearm. Right medial knee--quarter sized patch of erythema, no vesicles. One papule at medial mid calf Trunk is clear  PF 420/510/475 (expected closer to 575) Last used inhaler last night.  ASSESSMENT/PLAN:  Nonproductive cough - persistent despite albuterol, course of biaxin. Check CXR - Plan: DG Chest 2 View, methylPREDNISolone (MEDROL DOSEPAK) 4 MG TBPK tablet  Plant dermatitis - spreading to non-exposed areas.  Treat with oral steroids.  Risks reviewed, including high sugars - Plan: methylPREDNISolone (MEDROL DOSEPAK) 4 MG TBPK tablet  Pneumonia of right lower lobe due to infectious organism [facility]) - noted after CXR--hilar adenopathy also noted.  Pt to f/u in 10d for recheck after ABX, sooner prn if not improving or if worse - Plan: levofloxacin (LEVAQUIN) 750 MG tablet  Diabetes mellitus, type II, insulin dependent ([place])   ADDENDUM: CXR results: IMPRESSION: RIGHT basilar opacity question pneumonia.  BILATERAL hilar enlargement  suggesting hilar adenopathy; this could be reactive in the setting of pneumonia or reflect other etiologies including sarcoidosis, lymphoma, or metastatic disease.  Short-term follow-up chest radiograph recommended in 1-2 weeks following treatment of potential pneumonia in order to reassess for resolution of RIGHT basilar process and to assess persistence of the hilar enlargement.      [facility] to [facility]) for chest x-ray. We will contact you with the results later today.   I suspect that you have a contact dermatitis (such as poison ivy).  Given that it has been spreading to new locations despite new exposure, we need to treat with systemic steroids.  Try and avoid scratching your skin.  Use zyrtec/allegra/claritin or benadryl to help with itching. You may also use topical medications such as hydrocortisone cream, other anti-itch creams such as calamine/caladryl, etc.  Consider using benadryl at bedtime (makes you drowsy) if you find yourself scratching in your sleep.  Take the steroid pills as directed.  This  will make your sugars go up significantly.  Be in contact with Dr. Buddy Duty, if needed. You will need extra sliding scale. If your rash or cough return as the dose is lowered, or as soon as you finish the pills, we may need to extend the steroid course (at low dose) for longer.  The benzonatate pills that you were given at last visit only rarely cause sedation--you likely can take this during the day to help with your cough, if needed.

## 2016-12-03 ENCOUNTER — Encounter: Payer: Self-pay | Admitting: Family Medicine

## 2016-12-03 ENCOUNTER — Ambulatory Visit (INDEPENDENT_AMBULATORY_CARE_PROVIDER_SITE_OTHER): Payer: 59 | Admitting: Family Medicine

## 2016-12-03 ENCOUNTER — Ambulatory Visit
Admission: RE | Admit: 2016-12-03 | Discharge: 2016-12-03 | Disposition: A | Discharge status: Home / Self Care | Payer: 59 | Source: Ambulatory Visit | Attending: Family Medicine | Admitting: Family Medicine

## 2016-12-03 VITALS — BP 122/80 | HR 94 | Temp 98.5°F | Resp 18 | Wt 210.8 lb

## 2016-12-03 DIAGNOSIS — J189 Pneumonia, unspecified organism: Secondary | ICD-10-CM

## 2016-12-03 DIAGNOSIS — E119 Type 2 diabetes mellitus without complications: Secondary | ICD-10-CM | POA: Diagnosis not present

## 2016-12-03 DIAGNOSIS — L255 Unspecified contact dermatitis due to plants, except food: Secondary | ICD-10-CM

## 2016-12-03 DIAGNOSIS — R058 Other specified cough: Secondary | ICD-10-CM

## 2016-12-03 DIAGNOSIS — J181 Lobar pneumonia, unspecified organism: Secondary | ICD-10-CM | POA: Diagnosis not present

## 2016-12-03 DIAGNOSIS — R05 Cough: Secondary | ICD-10-CM

## 2016-12-03 DIAGNOSIS — Z794 Long term (current) use of insulin: Secondary | ICD-10-CM

## 2016-12-03 MED ORDER — LEVOFLOXACIN 750 MG PO TABS: 750.0000 mg | ORAL_TABLET | Freq: Every day | ORAL | 0 refills | Status: DC

## 2016-12-03 MED ORDER — METHYLPREDNISOLONE 4 MG PO TBPK: ORAL_TABLET | ORAL | 0 refills | Status: DC

## 2016-12-03 NOTE — Patient Instructions (Signed)
  Go to [facility]) for chest x-ray. We will contact you with the results later today.   I suspect that you have a contact dermatitis (such as poison ivy).  Given that it has been spreading to new locations despite new exposure, we need to treat with systemic steroids.  Try and avoid scratching your skin.  Use zyrtec/allegra/claritin or benadryl to help with itching. You may also use topical medications such as hydrocortisone cream, other anti-itch creams such as calamine/caladryl, etc.  Consider using benadryl at bedtime (makes you drowsy) if you find yourself scratching in your sleep.  Take the steroid pills as directed.  This will make your sugars go up significantly.  Be in contact with Dr. Buddy Duty, if needed. You will need extra sliding scale. If your rash or cough return as the dose is lowered, or as soon as you finish the pills, we may need to extend the steroid course (at low dose) for longer.  The benzonatate pills that you were given at last visit only rarely cause sedation--you likely can take this during the day to help with your cough, if needed.

## 2016-12-04 ENCOUNTER — Encounter: Payer: Self-pay | Admitting: Family Medicine

## 2016-12-06 ENCOUNTER — Encounter: Payer: Self-pay | Admitting: Family Medicine

## 2016-12-09 ENCOUNTER — Encounter: Payer: Self-pay | Admitting: Family Medicine

## 2016-12-12 NOTE — Progress Notes (Signed)
Chief Complaint  Patient presents with  . Follow-up    on pneumonia.    Patient presents to follow up on cough.  He states it comes and goes but overall really isn't any better.  He continues to have dry, hacky cough.  Bad last night and this morning, a little worse than it had been. No fever, chills, night sweats. He continues to use the inhaler every 6 hours, and thinks it does help some.  He needed it this frequently even when on the steroid pack.  To recap: Seen by Vincenza Hews 7/27 after a week of upper respiratory symptoms. Treated with clarithromycin and hycodan syrup, as well as Mucinex. Cough was no longer productive, became dry and quite bothersome. He was seen by me on 8/1.  At that point he was coughing so much he couldn't eat and had some post-tussive emesis. Cough wasn't related to exertion, exercise. Cough doesn't keep him up at night--essential oils on the chest helped.  He was given albuterol nebulizer treatment in office which was helpful, so he was prescribed albuterol albuterol, as well as tessalon. He reports that he was back to coughing by the time he reached his car (benefit from neb was very temporary). The tessalon has helped some, only takes at night for fear of sedation.  He has been using the albuterol inhaler, and thinks it helps.  Sometimes he couldn't hold in the medication, coughs it right out.   At his visit 8/17 his peak flows were 420/510/475.  He was treated with Medrol Dosepak (he also had plant dermatitis) and after CXR report, started on Levaquin.  The CXR showed: IMPRESSION: RIGHT basilar opacity question pneumonia. BILATERAL hilar enlargement suggesting hilar adenopathy; this could be reactive in the setting of pneumonia or reflect other etiologies including sarcoidosis, lymphoma, or metastatic disease. Short-term follow-up chest radiograph recommended in 1-2 weeks following treatment of potential pneumonia in order to reassess for resolution of RIGHT basilar  process and to assess persistence of the hilar enlargement.  He contacted [place] on 8/20 reporting that he vomited. This only occurred once.  No diarrhea. He has completed the levaquin and steroids. He does not feel any different.  PMH, PSH, SH reviewed  Outpatient Encounter Prescriptions as of 12/13/2016  Medication Sig Note  . acetaminophen (TYLENOL) 500 MG tablet Take 1,000 mg by mouth every 6 (six) hours as needed for moderate pain. 11/17/2016: Using for headaches with coughing  . albuterol (PROVENTIL HFA;VENTOLIN HFA) 108 (90 Base) MCG/ACT inhaler Inhale 2 puffs into the lungs every 6 (six) hours as needed for wheezing or shortness of breath.   Marland Kitchen atorvastatin (LIPITOR) 20 MG tablet TAKE 1 TABLET (20 MG TOTAL) BY MOUTH DAILY. 11/17/2016: Stopped temporarily while on abx  . benzonatate (TESSALON) 200 MG capsule Take 1 capsule (200 mg total) by mouth 3 (three) times daily as needed for cough.   . Continuous Blood Gluc Sensor (FREESTYLE LIBRE SENSOR SYSTEM) MISC APPLY ONE SENSOR TO BACK OF UPPER ARM EVERY 10 DAYS   . glucose blood test strip Patient is to test BID   DX: E11.9 This is onetouch ultra test strips   . HYDROcodone-homatropine (HYCODAN) 5-1.5 MG/5ML syrup Take 5 mLs by mouth every 8 (eight) hours as needed for cough. 11/17/2016: Last dose last night, didn't help  . levothyroxine (SYNTHROID, LEVOTHROID) 175 MCG tablet TAKE 1 TABLET BY MOUTH EVERY DAY IN THE MORNING ON EMPTY STOMACH   . lisinopril (PRINIVIL,ZESTRIL) 5 MG tablet TAKE 1 TABLET (5 MG TOTAL)  BY MOUTH DAILY.   Marland Kitchen NOVOLOG FLEXPEN 100 UNIT/ML FlexPen  11/17/2016: pump  . ONE TOUCH ULTRA TEST test strip TEST SUGARS 2 HOURS AFTER MEALS DAILY   . pantoprazole (PROTONIX) 40 MG tablet Take 1 tablet 30 minutes before breakfast daily   . [DISCONTINUED] levofloxacin (LEVAQUIN) 750 MG tablet Take 1 tablet (750 mg total) by mouth daily.   . [DISCONTINUED] methylPREDNISolone (MEDROL DOSEPAK) 4 MG TBPK tablet Use as directed    No  facility-administered encounter medications on file as of 12/13/2016.    No Known Allergies   PHYSICAL EXAM:  BP 130/84 (BP Location: Left Arm, Patient Position: Sitting, Cuff Size: Normal)   Pulse 92   Temp 98.6 F (37 C) (Tympanic)   Ht 5' 7" (1.702 m)   Wt 212 lb 12.8 oz (96.5 kg)   BMI 33.33 kg/m   Wt Readings from Last 3 Encounters:  12/13/16 212 lb 12.8 oz (96.5 kg)  12/03/16 210 lb 12.8 oz (95.6 kg)  11/17/16 216 lb 3.2 oz (98.1 kg)   Pleasant, well-appearing male, speaking comfortably in full sentences, with periodic coughing spasms, dry cough. HEENT: PERRL, EOMI, conjunctiva and sclera are clear. TM's and EAC's normal.  Sinuses are nontender. OP Small white spot on an erythematous base at the right anterior tonsillar pillar superiorly.  (he denies any discomfort).  No other redness/lesions. Neck: no lymphadenopathy, thyromegaly or mass No supraclavicular, axillary or inguinal lymphadenopathy Heart: regular rate and rhythm Lungs: clear bilaterally. No wheezes, rales, ronchi. Fair air movement. Extremities: no edema Skin: normal turgor, no rash Psych: normal mood, affect, hygiene and grooming Neuro: alert and oriented, cranial nerves intact, normal speech, gait  ASSESSMENT/PLAN:  Pneumonia of right lower lobe due to infectious organism [facility]) - Plan: CBC with Differential/Platelet, DG Chest 2 View  Nonproductive cough - Plan: CBC with Differential/Platelet, DG Chest 2 View  Hilar adenopathy - Plan: CBC with Differential/Platelet, Comprehensive metabolic panel, DG Chest 2 View  Ddx reviewed. F/u CXR, suspect will need CT as well.  Will contact with results. CBC, c-met (nonfasting)

## 2016-12-13 ENCOUNTER — Encounter: Payer: Self-pay | Admitting: Family Medicine

## 2016-12-13 ENCOUNTER — Other Ambulatory Visit: Payer: Self-pay | Admitting: *Deleted

## 2016-12-13 ENCOUNTER — Ambulatory Visit (INDEPENDENT_AMBULATORY_CARE_PROVIDER_SITE_OTHER): Payer: 59 | Admitting: Family Medicine

## 2016-12-13 ENCOUNTER — Other Ambulatory Visit: Payer: Self-pay | Admitting: Family Medicine

## 2016-12-13 ENCOUNTER — Ambulatory Visit
Admission: RE | Admit: 2016-12-13 | Discharge: 2016-12-13 | Disposition: A | Discharge status: Home / Self Care | Payer: 59 | Source: Ambulatory Visit | Attending: Family Medicine | Admitting: Family Medicine

## 2016-12-13 VITALS — BP 130/84 | HR 92 | Temp 98.6°F | Ht 67.0 in | Wt 212.8 lb

## 2016-12-13 DIAGNOSIS — J181 Lobar pneumonia, unspecified organism: Secondary | ICD-10-CM

## 2016-12-13 DIAGNOSIS — R918 Other nonspecific abnormal finding of lung field: Secondary | ICD-10-CM

## 2016-12-13 DIAGNOSIS — J189 Pneumonia, unspecified organism: Secondary | ICD-10-CM

## 2016-12-13 DIAGNOSIS — R590 Localized enlarged lymph nodes: Secondary | ICD-10-CM | POA: Diagnosis not present

## 2016-12-13 DIAGNOSIS — R058 Other specified cough: Secondary | ICD-10-CM

## 2016-12-13 DIAGNOSIS — R05 Cough: Secondary | ICD-10-CM

## 2016-12-13 DIAGNOSIS — R059 Cough, unspecified: Secondary | ICD-10-CM

## 2016-12-13 LAB — CBC WITH DIFFERENTIAL/PLATELET
Basophils Absolute: 0 cells/uL (ref 0–200)
Basophils Relative: 0 %
Eosinophils Absolute: 567 cells/uL — ABNORMAL HIGH (ref 15–500)
Eosinophils Relative: 7 %
HCT: 43.5 % (ref 38.5–50.0)
Hemoglobin: 14.3 g/dL (ref 13.2–17.1)
Lymphocytes Relative: 20 %
Lymphs Abs: 1620 cells/uL (ref 850–3900)
MCH: 29.4 pg (ref 27.0–33.0)
MCHC: 32.9 g/dL (ref 32.0–36.0)
MCV: 89.3 fL (ref 80.0–100.0)
MPV: 8.8 fL (ref 7.5–12.5)
Monocytes Absolute: 891 cells/uL (ref 200–950)
Monocytes Relative: 11 %
Neutro Abs: 5022 cells/uL (ref 1500–7800)
Neutrophils Relative %: 62 %
Platelets: 361 10*3/uL (ref 140–400)
RBC: 4.87 MIL/uL (ref 4.20–5.80)
RDW: 13.4 % (ref 11.0–15.0)
WBC: 8.1 10*3/uL (ref 4.0–10.5)

## 2016-12-13 NOTE — Patient Instructions (Signed)
Go to [facility] this morning for a follow-up x-ray. We will be in touch tomorrow with your lab results.  I suspect we will also be checking a CT scan (depends on what the chest x-ray shows). I am concerned that your cough has not improved.  We likely will be referring you to the pulmonary doctors.

## 2016-12-14 ENCOUNTER — Ambulatory Visit
Admission: RE | Admit: 2016-12-14 | Discharge: 2016-12-14 | Disposition: A | Discharge status: Home / Self Care | Payer: 59 | Source: Ambulatory Visit | Attending: Family Medicine | Admitting: Family Medicine

## 2016-12-14 DIAGNOSIS — R05 Cough: Secondary | ICD-10-CM

## 2016-12-14 DIAGNOSIS — R059 Cough, unspecified: Secondary | ICD-10-CM

## 2016-12-14 DIAGNOSIS — R918 Other nonspecific abnormal finding of lung field: Secondary | ICD-10-CM

## 2016-12-14 LAB — COMPREHENSIVE METABOLIC PANEL
ALT: 19 U/L (ref 9–46)
AST: 18 U/L (ref 10–40)
Albumin: 3.9 g/dL (ref 3.6–5.1)
Alkaline Phosphatase: 73 U/L (ref 40–115)
BUN: 23 mg/dL (ref 7–25)
CO2: 23 mmol/L (ref 20–32)
Calcium: 9.3 mg/dL (ref 8.6–10.3)
Chloride: 103 mmol/L (ref 98–110)
Creat: 1.18 mg/dL (ref 0.60–1.35)
Glucose, Bld: 145 mg/dL — ABNORMAL HIGH (ref 65–99)
Potassium: 4.4 mmol/L (ref 3.5–5.3)
Sodium: 138 mmol/L (ref 135–146)
Total Bilirubin: 0.5 mg/dL (ref 0.2–1.2)
Total Protein: 6.7 g/dL (ref 6.1–8.1)

## 2016-12-14 MED ORDER — IOPAMIDOL (ISOVUE-300) INJECTION 61%
75.0000 mL | Freq: Once | INTRAVENOUS | Status: AC | PRN
  Administered 2016-12-14: 75 mL via INTRAVENOUS

## 2016-12-15 ENCOUNTER — Other Ambulatory Visit: Payer: Self-pay | Admitting: *Deleted

## 2016-12-15 ENCOUNTER — Encounter: Payer: Self-pay | Admitting: Family Medicine

## 2016-12-15 DIAGNOSIS — J189 Pneumonia, unspecified organism: Secondary | ICD-10-CM

## 2016-12-15 DIAGNOSIS — R590 Localized enlarged lymph nodes: Secondary | ICD-10-CM

## 2016-12-15 LAB — ANGIOTENSIN CONVERTING ENZYME: Angiotensin-Converting Enzyme: 18 U/L (ref 9–67)

## 2016-12-16 ENCOUNTER — Other Ambulatory Visit: Payer: 59

## 2016-12-17 ENCOUNTER — Encounter: Payer: Self-pay | Admitting: Family Medicine

## 2016-12-24 ENCOUNTER — Ambulatory Visit (INDEPENDENT_AMBULATORY_CARE_PROVIDER_SITE_OTHER): Payer: 59 | Admitting: Pulmonary Disease

## 2016-12-24 ENCOUNTER — Encounter: Payer: Self-pay | Admitting: Pulmonary Disease

## 2016-12-24 VITALS — BP 110/74 | HR 87 | Ht 67.0 in | Wt 211.2 lb

## 2016-12-24 DIAGNOSIS — R599 Enlarged lymph nodes, unspecified: Secondary | ICD-10-CM | POA: Diagnosis not present

## 2016-12-24 DIAGNOSIS — D869 Sarcoidosis, unspecified: Secondary | ICD-10-CM | POA: Insufficient documentation

## 2016-12-24 DIAGNOSIS — R590 Localized enlarged lymph nodes: Secondary | ICD-10-CM

## 2016-12-24 NOTE — Progress Notes (Signed)
Subjective:    Patient ID: Alejandro Griffin, male    DOB: [birth year], 36 y.o.   MRN: [id number]  HPI  Chief Complaint  Patient presents with  . Pulm Consult    Pt referred Dr. Tomi Bamberger for pneumonia. Pt had resp. infection in July, turned into broc., with consistant cough daily throughout the day. Pt some SOB with cough, tightness in chest with pain.     36 year old type I diabetic, never smoker presents for evaluation of abnormal imaging studies. He presented July 2018 with a dry cough and tightness in his chest. This was preceded by URI symptoms and his wife was sick at the same time. He was initially diagnosed with asthmatic bronchitis and treated with antibiotics and prednisone. When symptoms persisted chest x-ray on 8/17 was performed which showed bilateral lymphadenopathy and right lower lobe infiltrate, he was treated with antibiotics Chest x-ray on 8/27 was unchanged and CT chest was obtained on 8/28 which confirmed mediastinal and bilateral hilar lymphadenopathy and also picked up some lymph nodes in the abdomen. There is an area of consolidation in the right lung base and other areas of groundglass opacification.  He continues to have this deep cough without diurnal variation that occasionally wakes him up from sleep, cough is improved. He has also developed a hypersensitivity to chemicals and odors. Prednisone & Hycodan cough syrup give him the most relief. I have reviewed all his imaging studies. ACE level was 18 Labs do not show any evidence of leukocytosis or left shift  He is a type I diabetic for 5 years started on an insulin pump earlier this year, also has hypothyroidism. 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 Diabetes mellitus without complication ([place])   . Hyperlipidemia   . Hypertension   .  Hypothyroidism   . Inguinal hernia    RT  . Low testosterone   . Obesity   . Vitamin D deficiency     Past Surgical History:  Procedure Laterality Date  . INGUINAL HERNIA REPAIR Right 02/20/2015   Procedure: RIGHT INGUINAL HERNIA REPAIR WITH MESH;  Surgeon: Donnie Mesa, MD;  Location: [facility];  Service: General;  Laterality: Right;  . INSERTION OF MESH Right 02/20/2015   Procedure: INSERTION OF MESH;  Surgeon: Donnie Mesa, MD;  Location: [facility];  Service: General;  Laterality: Right;  . WISDOM TOOTH EXTRACTION      No Known Allergies   Social History   Social History  . Marital status: Married    Spouse name: N/A  . Number of children: 1  . Years of education: N/A   Occupational History  . Electrician    Social History Main Topics  . Smoking status: Never Smoker  . Smokeless tobacco: Never Used  . Alcohol use No  . Drug use: No  . Sexual activity: Yes   Other Topics Concern  . Not on file   Social History Narrative   Married, 65 yo son, 3 cats (1 is his brother's); brother is also living with them currently.   No tobacco exposure   Heavy Marketing executive for [employer]      Family History  Problem Relation Age of Onset  . Hypertension Mother   . High Cholesterol Mother   . Diabetes Mother   . Diabetes Maternal Grandmother   .  Cancer Paternal Grandfather   . Diabetes Paternal Grandfather   . Heart attack Father   . High Cholesterol Father   . Diabetes Maternal Grandfather      Review of Systems  Constitutional: Negative for fever and unexpected weight change.  HENT: Negative for congestion, dental problem, ear pain, nosebleeds, postnasal drip, rhinorrhea, sinus pressure, sneezing, sore throat and trouble swallowing.   Eyes: Negative for redness and itching.  Respiratory: Positive for cough, chest tightness and shortness of breath. Negative for wheezing.   Cardiovascular: Negative for palpitations and leg swelling.  Gastrointestinal: Negative for  nausea and vomiting.  Genitourinary: Negative for dysuria.  Musculoskeletal: Negative for joint swelling.  Skin: Negative for rash.  Allergic/Immunologic: Negative.  Negative for environmental allergies, food allergies and immunocompromised state.  Neurological: Negative for headaches.  Hematological: Does not bruise/bleed easily.  Psychiatric/Behavioral: Negative for dysphoric mood. The patient is not nervous/anxious.        Objective:   Physical Exam  Gen. Pleasant, well-nourished, in no distress, normal affect ENT - no lesions, no post nasal drip Neck: No JVD, no thyromegaly, no carotid bruits Lungs: no use of accessory muscles, no dullness to percussion, clear without rales or rhonchi  Cardiovascular: Rhythm regular, heart sounds  normal, no murmurs or gallops, no peripheral edema Abdomen: soft and non-tender, no hepatosplenomegaly, BS normal. Musculoskeletal: No deformities, no cyanosis or clubbing Neuro:  alert, non focal       Assessment & Plan:

## 2016-12-24 NOTE — Patient Instructions (Signed)
We discussed causes of enlarged lymph glands in the chest and abdomen  We discussed various options of biopsy. Referral to thoracic surgery- Dr. Roxan Hockey for mediastinoscopy biopsy of lymph gland  Meanwhile take Delsym 5 ML twice daily for cough Okay to take Hycodan at bedtime

## 2016-12-24 NOTE — Assessment & Plan Note (Signed)
Diffusion diagnosis of mediastinal, hilar and abdominal lymphadenopathy includes sarcoidosis another lymphoproliferative conditions. Sarcoidosis would also explain the areas of consolidation and groundglass seen in the lung parenchyma. His main complaint is cough  The various options of biopsy including bronchoscopy, EBUS and surgical biopsy were discussed.The risks of each procedure including coughing, bleeding and the  chances of lung puncture requiring chest tube were discussed in great detail. The benefits & alternatives including serial follow up were also discussed.   Referral to thoracic surgery- Dr. Roxan Hockey for mediastinoscopy biopsy of lymph gland  Meanwhile take Delsym 5 ML twice daily for cough Okay to take Hycodan at bedtime

## 2017-01-03 ENCOUNTER — Encounter: Payer: Self-pay | Admitting: Family Medicine

## 2017-01-03 NOTE — Progress Notes (Signed)
Chief Complaint  Patient presents with  . Annual Exam    fasting annual exam. No concerns. Dr.Alva told him to wait on flu vaccine. Also stopped lisinopril.   ' Alejandro Griffin is a 36 y.o. male who presents for a complete physical.  He has the following concerns:  Cough with hilar adenopathy:  He saw Dr. Elsworth Soho, and was referred to thoracic surgery- Dr. Roxan Hockey for possible mediastinoscopy biopsy of lymph gland. Scheduled for consult on  9/19. He was advised to take Delsym 5 ML twice daily for cough, and okay to take Hycodan at bedtime. This has been helpful. Lisinopril was also stopped after 9/7 visit. He has been on this since diagnoses of diabetes years ago. Overall he is doing a little better.  Still coughing, but not as often.  Mainly flares after work, and some periodic cough during the day. Doesn't wake him up at night.  Didn't take hycodan last night and was fine, uses the hycodan prn.  Type 1 diabetes: under the care of Dr. Buddy Duty.  He has insulin pump.  Last visit note we have is 06/2016, normal foot exam done then. No labs available.  Patient showed me results through the Patient Portal on his phone--see below. He checks his feet regularly, denies concerns.  Last diabetic eye exam was 01/2016, no retinopathy present.  Hypothyroidism: now also managed by Dr. Buddy Duty. Dose has adjusted per labs below.  He denies changes in hair/skin/bowels/energy/weight.  c-met 11/02/16 normal, glucose 114, A1c 8.9; Total chol 179, HDL 43 Chol/HDL 4.2, LDL 107 ,TG 148, TSH 12.27  Dose of thyroid medication was increased from 150 to 175 mcg in July after elevated TSH TSH 8.42 on 12/24/16 and dose was further increased to 200 mcg (hasn't gotten rx yet)  Vitamin D deficiency:  Noted to be low at 20 in 2016. He was taking MVI daily until June or July.  Changed over to D[employer] oil vitamins--not sure if/how much vitamin D is in these.  H/o Low testosterone: last check 2017 was normal (524); it was low at 278 in  2016. He had normal prolactin level. Never was on treatment. He denies any decreased libido, fatigue, erectile dysfunction.  Immunization History  Administered Date(s) Administered  . Influenza,inj,Quad PF,6+ Mos 01/24/2013, 02/28/2014, 01/16/2016  . 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 Walks 5 minute intervals frequently. Heavy lifting at work.  Past Medical History:  Diagnosis Date  . Diabetes mellitus without complication ([place])   . Gastritis 2016   confirmed on bx on EGD. Dr. Hilarie Fredrickson  . Hyperlipidemia   . Hypertension   . Hypothyroidism   . Inguinal hernia    RT  . Low testosterone 2016   better on repeat in 2017  . Obesity   . Vitamin D deficiency    Past Surgical History:  Procedure Laterality Date  . INGUINAL HERNIA REPAIR Right 02/20/2015   Procedure: RIGHT INGUINAL HERNIA REPAIR WITH MESH;  Surgeon: Donnie Mesa, MD;  Location: [facility];  Service: General;  Laterality: Right;  . INSERTION OF MESH Right 02/20/2015   Procedure: INSERTION OF MESH;  Surgeon: Donnie Mesa, MD;  Location: [facility];  Service: General;  Laterality: Right;  . WISDOM TOOTH EXTRACTION     Social History   Social History  . Marital status: Married    Spouse name: N/A  . Number of  children: 1  . Years of education: N/A   Occupational History  . Electrician    Social History Main Topics  . Smoking status: Never Smoker  . Smokeless tobacco: Never Used  . Alcohol use No  . Drug use: No  . Sexual activity: Yes    Partners: Female   Other Topics Concern  . Not on file   Social History Narrative   Married, 42 yo son, 3 cats (1 is his brother's); brother is also living with them currently.   No tobacco exposure   Heavy industrial electrical at [employer]   Family History  Problem Relation Age of Onset  . Hypertension Mother   . High  Cholesterol Mother   . Diabetes Mother        borderline  . Diabetes Maternal Grandmother   . Diabetes Paternal Grandfather   . Heart attack Father 48  . High Cholesterol Father   . Diabetes Maternal Grandfather   . Cancer Paternal Uncle        throat cancer (smoker)    Outpatient Encounter Prescriptions as of 01/04/2017  Medication Sig Note  . atorvastatin (LIPITOR) 20 MG tablet TAKE 1 TABLET (20 MG TOTAL) BY MOUTH DAILY.   Marland Kitchen Continuous Blood Gluc Sensor (FREESTYLE LIBRE SENSOR SYSTEM) MISC APPLY ONE SENSOR TO BACK OF UPPER ARM EVERY 10 DAYS   . glucose blood test strip Patient is to test BID   DX: E11.9 This is onetouch ultra test strips   . HYDROcodone-homatropine (HYCODAN) 5-1.5 MG/5ML syrup Take 5 mLs by mouth every 8 (eight) hours as needed for cough. 01/04/2017: Uses at night prn  . levothyroxine (SYNTHROID, LEVOTHROID) 175 MCG tablet TAKE 1 TABLET BY MOUTH EVERY DAY IN THE MORNING ON EMPTY STOMACH 01/04/2017: Dose to be changed by endocrinologist to 283mg (hasn't gotten new rx)  . NOVOLOG FLEXPEN 100 UNIT/ML FlexPen  11/17/2016: pump  . ONE TOUCH ULTRA TEST test strip TEST SUGARS 2 HOURS AFTER MEALS DAILY   . pantoprazole (PROTONIX) 40 MG tablet Take 1 tablet 30 minutes before breakfast daily   . acetaminophen (TYLENOL) 500 MG tablet Take 1,000 mg by mouth every 6 (six) hours as needed for moderate pain. 11/17/2016: Using for headaches with coughing  . albuterol (PROVENTIL HFA;VENTOLIN HFA) 108 (90 Base) MCG/ACT inhaler Inhale 2 puffs into the lungs every 6 (six) hours as needed for wheezing or shortness of breath. (Patient not taking: Reported on 01/04/2017)   . lisinopril (PRINIVIL,ZESTRIL) 5 MG tablet TAKE 1 TABLET (5 MG TOTAL) BY MOUTH DAILY. (Patient not taking: Reported on 01/04/2017) 01/04/2017: Stopped 9/7 after pulmonary visit   No facility-administered encounter medications on file as of 01/04/2017.    No Known Allergies   ROS: The patient denies anorexia, fever, weight  changes, headaches,  vision loss, decreased hearing, ear pain, hoarseness, chest pain, palpitations, dizziness, syncope, dyspnea on exertion, swelling, nausea, vomiting, diarrhea, constipation, abdominal pain, melena, hematochezia, indigestion/heartburn, hematuria, incontinence, erectile dysfunction, nocturia, weakened urine stream, dysuria, genital lesions, joint pains, numbness, tingling, weakness, tremor, suspicious skin lesions, depression, anxiety, abnormal bleeding/bruising, or enlarged lymph nodes (other than as noted on CXR).  Cough as per HPI   PHYSICAL EXAM:  BP 120/90 (BP Location: Left Arm, Patient Position: Sitting, Cuff Size: Normal)   Pulse 76   Ht 5' 7.5" (1.715 m)   Wt 208 lb 9.6 oz (94.6 kg)   BMI 32.19 kg/m    Wt Readings from Last 3 Encounters:  01/04/17 208 lb 9.6 oz (94.6 kg)  12/24/16 211 lb 3.2 oz (95.8 kg)  12/13/16 212 lb 12.8 oz (96.5 kg)   124/86 on repeat by MD, [place]  General Appearance:    Alert, cooperative, no distress, appears stated age. Rare dry cough spell, much improved from prior visits.  Head:    Normocephalic, without obvious abnormality, atraumatic  Eyes:    PERRL, conjunctiva/corneas clear, EOM's intact, fundi benign  Ears:    Normal TM's and external ear canals  Nose:   Nares normal, mucosa normal, no drainage or sinus tenderness  Throat:   Lips, mucosa, and tongue normal; teeth and gums normal  Neck:   Supple, no lymphadenopathy;  thyroid:  no   enlargement/tenderness/nodules; no carotid bruit or JVD  Back:    Spine nontender, no curvature, ROM normal, no CVA tenderness  Lungs:     Clear to auscultation bilaterally without wheezes, rales or ronchi; respirations unlabored  Chest Wall:    No tenderness or deformity   Heart:    Regular rate and rhythm, S1 and S2 normal, no murmur, rub or gallop  Breast Exam:    No chest wall tenderness, masses or gynecomastia  Abdomen:     Soft, non-tender, nondistended, normoactive bowel sounds, no masses, no  hepatosplenomegaly  Genitalia:    Normal male external genitalia without lesions.  Testicles without masses.  No inguinal hernias.  Rectal:   Deferred due to age <40 and lack of symptoms  Extremities:   No clubbing, cyanosis or edema  Pulses:   2+ and symmetric all extremities  Skin:   Skin color, texture, turgor normal, no rashes or lesions  Lymph nodes:   Cervical, supraclavicular, and axillary nodes normal  Neurologic:   CNII-XII intact, normal strength, sensation and gait; reflexes 2+ and symmetric throughout          Psych:  Normal mood, affect, hygiene and grooming  Normal diabetic foot exam  Lab Results  Component Value Date   WBC 8.1 12/13/2016   HGB 14.3 12/13/2016   HCT 43.5 12/13/2016   MCV 89.3 12/13/2016   PLT 361 12/13/2016     Chemistry      Component Value Date/Time   NA 138 12/13/2016 0823   K 4.4 12/13/2016 0823   CL 103 12/13/2016 0823   CO2 23 12/13/2016 0823   BUN 23 12/13/2016 0823   CREATININE 1.18 12/13/2016 0823      Component Value Date/Time   CALCIUM 9.3 12/13/2016 0823   ALKPHOS 73 12/13/2016 0823   AST 18 12/13/2016 0823   ALT 19 12/13/2016 0823   BILITOT 0.5 12/13/2016 0823      ASSESSMENT/PLAN:  Annual physical exam - Plan: Visual acuity screening, POCT Urinalysis DIP (Proadvantage Device)  Hilar adenopathy - scheduled for surgical consult tomorrow to discuss mediastinoscopy and biopsy of LN  Nonproductive cough - somewhat improved  Diabetes mellitus, type II, insulin dependent ([facility]) - under care of Dr. Buddy Duty, on pump. Last A1c 8.9.  f/u as previously arranged  Other specified hypothyroidism - pt to contact Dr. Cindra Eves office, as new dose was not yet sent to the pharmacy. suboptimally replaced per recent TSH  Hypertension associated with diabetes ([place]) - BP okay today, off lisinopril per pulm due to cough. Conside starting low dose ARB at f/u visit after pulm eval done  Obesity (BMI 30-39.9) - discussed diet, exercise, loss of  abdominal fat  Vitamin D deficiency - check level; to check on amount in his current vitamins - Plan: VITAMIN D 25 Hydroxy (Vit-D  Deficiency, Fractures)  Low cholesterol diet reviewed, goal of LDL<100. Suspect lipids will be rechecked through endo Last check in July was when TSH was off (and still is, so not checked today).  Flu shot and Pneumovax recommended--postponed due to pulm's recommendation   Recommended at least 30 minutes of aerobic activity at least 5 days/week, weight bearing exercise at least 2x/week; proper sunscreen use reviewed; healthy diet and alcohol recommendations (less than or equal to 2 drinks/day) reviewed; regular seatbelt use; changing batteries in smoke detectors. Self-testicular exams. Immunization recommendations discussed--flu shot and pneumovax recommended when okay'd by pulm.

## 2017-01-04 ENCOUNTER — Encounter: Payer: Self-pay | Admitting: Family Medicine

## 2017-01-04 ENCOUNTER — Telehealth: Payer: Self-pay | Admitting: Pulmonary Disease

## 2017-01-04 ENCOUNTER — Ambulatory Visit (INDEPENDENT_AMBULATORY_CARE_PROVIDER_SITE_OTHER): Payer: 59 | Admitting: Family Medicine

## 2017-01-04 VITALS — BP 124/86 | HR 76 | Ht 67.5 in | Wt 208.6 lb

## 2017-01-04 DIAGNOSIS — E669 Obesity, unspecified: Secondary | ICD-10-CM

## 2017-01-04 DIAGNOSIS — E559 Vitamin D deficiency, unspecified: Secondary | ICD-10-CM

## 2017-01-04 DIAGNOSIS — E1159 Type 2 diabetes mellitus with other circulatory complications: Secondary | ICD-10-CM | POA: Diagnosis not present

## 2017-01-04 DIAGNOSIS — Z794 Long term (current) use of insulin: Secondary | ICD-10-CM

## 2017-01-04 DIAGNOSIS — E038 Other specified hypothyroidism: Secondary | ICD-10-CM

## 2017-01-04 DIAGNOSIS — R05 Cough: Secondary | ICD-10-CM

## 2017-01-04 DIAGNOSIS — R590 Localized enlarged lymph nodes: Secondary | ICD-10-CM | POA: Diagnosis not present

## 2017-01-04 DIAGNOSIS — I10 Essential (primary) hypertension: Secondary | ICD-10-CM

## 2017-01-04 DIAGNOSIS — Z0000 Encounter for general adult medical examination without abnormal findings: Secondary | ICD-10-CM | POA: Diagnosis not present

## 2017-01-04 DIAGNOSIS — R058 Other specified cough: Secondary | ICD-10-CM

## 2017-01-04 DIAGNOSIS — E119 Type 2 diabetes mellitus without complications: Secondary | ICD-10-CM | POA: Diagnosis not present

## 2017-01-04 LAB — POCT URINALYSIS DIP (PROADVANTAGE DEVICE)
Bilirubin, UA: NEGATIVE
Blood, UA: NEGATIVE
Glucose, UA: NEGATIVE mg/dL
Ketones, POC UA: NEGATIVE mg/dL
Leukocytes, UA: NEGATIVE
Nitrite, UA: NEGATIVE
Protein Ur, POC: NEGATIVE mg/dL
Specific Gravity, Urine: 1.02
Urobilinogen, Ur: NEGATIVE
pH, UA: 6 (ref 5.0–8.0)

## 2017-01-04 NOTE — Patient Instructions (Addendum)
HEALTH MAINTENANCE RECOMMENDATIONS:  It is recommended that you get at least 30 minutes of aerobic exercise at least 5 days/week (for weight loss, you may need as much as 60-90 minutes). This can be any activity that gets your heart rate up. This can be divided in 10-15 minute intervals if needed, but try and build up your endurance at least once a week.  Weight bearing exercise is also recommended twice weekly.  Eat a healthy diet with lots of vegetables, fruits and fiber.  "Colorful" foods have a lot of vitamins (ie green vegetables, tomatoes, red peppers, etc).  Limit sweet tea, regular sodas and alcoholic beverages, all of which has a lot of calories and sugar.  Up to 2 alcoholic drinks daily may be beneficial for men (unless trying to lose weight, watch sugars).  Drink a lot of water.  Sunscreen of at least SPF 30 should be used on all sun-exposed parts of the skin when outside between the hours of 10 am and 4 pm (not just when at beach or pool, but even with exercise, golf, tennis, and yard work!)  Use a sunscreen that says "broad spectrum" so it covers both UVA and UVB rays, and make sure to reapply every 1-2 hours.  Remember to change the batteries in your smoke detectors when changing your clock times in the spring and fall.  Use your seat belt every time you are in a car, and please drive safely and not be distracted with cell phones and texting while driving.  Flu and pneumonia shots are recommended, whenever the pulmonary folks say it is okay. Your cholesterol was above goal in July (goal is LDL under 100); your thyroid was out of whack at that time, which can affect the lipids.  Once the thyroid dose is corrected, this should be repeated, and the lipitor dose adjusted, if needed.  We are checking your vitamin D levels today. At your convenience, let me know how much vitamin D you are getting through your current vitamins Meyer Russel).    Be sure to take your thyroid medication on an  empty stomach and separate from other medications.   Fat and Cholesterol Restricted Diet High levels of fat and cholesterol in your blood may lead to various health problems, such as diseases of the heart, blood vessels, gallbladder, liver, and pancreas. Fats are concentrated sources of energy that come in various forms. Certain types of fat, including saturated fat, may be harmful in excess. Cholesterol is a substance needed by your body in small amounts. Your body makes all the cholesterol it needs. Excess cholesterol comes from the food you eat. When you have high levels of cholesterol and saturated fat in your blood, health problems can develop because the excess fat and cholesterol will gather along the walls of your blood vessels, causing them to narrow. Choosing the right foods will help you control your intake of fat and cholesterol. This will help keep the levels of these substances in your blood within normal limits and reduce your risk of disease. What is my plan? Your health care provider recommends that you:  Limit your fat intake to ______% or less of your total calories per day.  Limit the amount of cholesterol in your diet to less than _________mg per day.  Eat 20-30 grams of fiber each day.  What types of fat should I choose?  Choose healthy fats more often. Choose monounsaturated and polyunsaturated fats, such as olive and canola oil, flaxseeds, walnuts, almonds, and seeds.  Eat more omega-3 fats. Good choices include salmon, mackerel, sardines, tuna, flaxseed oil, and ground flaxseeds. Aim to eat fish at least two times a week.  Limit saturated fats. Saturated fats are primarily found in animal products, such as meats, butter, and cream. Plant sources of saturated fats include palm oil, palm kernel oil, and coconut oil.  Avoid foods with partially hydrogenated oils in them. These contain trans fats. Examples of foods that contain trans fats are stick margarine, some tub  margarines, cookies, crackers, and other baked goods. What general guidelines do I need to follow? These guidelines for healthy eating will help you control your intake of fat and cholesterol:  Check food labels carefully to identify foods with trans fats or high amounts of saturated fat.  Fill one half of your plate with vegetables and green salads.  Fill one fourth of your plate with whole grains. Look for the word "whole" as the first word in the ingredient list.  Fill one fourth of your plate with lean protein foods.  Limit fruit to two servings a day. Choose fruit instead of juice.  Eat more foods that contain fiber, such as apples, broccoli, carrots, beans, peas, and barley.  Eat more home-cooked food and less restaurant, buffet, and fast food.  Limit or avoid alcohol.  Limit foods high in starch and sugar.  Limit fried foods.  Cook foods using methods other than frying. Baking, boiling, grilling, and broiling are all great options.  Lose weight if you are overweight. Losing just 5-10% of your initial body weight can help your overall health and prevent diseases such as diabetes and heart disease.  What foods can I eat? Grains  Whole grains, such as whole wheat or whole grain breads, crackers, cereals, and pasta. Unsweetened oatmeal, bulgur, barley, quinoa, or brown rice. Corn or whole wheat flour tortillas. Vegetables  Fresh or frozen vegetables (raw, steamed, roasted, or grilled). Green salads. Fruits  All fresh, canned (in natural juice), or frozen fruits. Meats and other protein foods  Ground beef (85% or leaner), grass-fed beef, or beef trimmed of fat. Skinless chicken or [place]. Ground chicken or [place]. Pork trimmed of fat. All fish and seafood. Eggs. Dried beans, peas, or lentils. Unsalted nuts or seeds. Unsalted canned or dry beans. Dairy  Low-fat dairy products, such as skim or 1% milk, 2% or reduced-fat cheeses, low-fat ricotta or cottage cheese, or plain  low-fat yo Fats and oils  Tub margarines without trans fats. Light or reduced-fat mayonnaise and salad dressings. Avocado. Olive, canola, sesame, or safflower oils. Natural peanut or almond butter (choose ones without added sugar and oil). The items listed above may not be a complete list of recommended foods or beverages. Contact your dietitian for more options. Foods to avoid Grains  White bread. White pasta. White rice. Cornbread. Bagels, pastries, and croissants. Crackers that contain trans fat. Vegetables  White potatoes. Corn. Creamed or fried vegetables. Vegetables in a cheese sauce. Fruits  Dried fruits. Canned fruit in light or heavy syrup. Fruit juice. Meats and other protein foods  Fatty cuts of meat. Ribs, chicken wings, bacon, sausage, bologna, salami, chitterlings, fatback, hot dogs, bratwurst, and packaged luncheon meats. Liver and organ meats. Dairy  Whole or 2% milk, cream, half-and-half, and cream cheese. Whole milk cheeses. Whole-fat or sweetened yogurt. Full-fat cheeses. Nondairy creamers and whipped toppings. Processed cheese, cheese spreads, or cheese curds. Beverages  Alcohol. Sweetened drinks (such as sodas, lemonade, and fruit drinks or punches). Fats and oils  Butter,  stick margarine, lard, shortening, ghee, or bacon fat. Coconut, palm kernel, or palm oils. Sweets and desserts  Corn syrup, sugars, honey, and molasses. Candy. Jam and jelly. Syrup. Sweetened cereals. Cookies, pies, cakes, donuts, muffins, and ice cream. The items listed above may not be a complete list of foods and beverages to avoid. Contact your dietitian for more information. This information is not intended to replace advice given to you by your health care provider. Make sure you discuss any questions you have with your health care provider. Document Released: 04/05/2005 Document Revised: 04/26/2014 Document Reviewed: 07/04/2013 Elsevier Interactive Patient Education  2017 [employer].

## 2017-01-04 NOTE — Telephone Encounter (Signed)
Dr. Elsworth Soho has nothing in four weeks. Cherina can you see if we can fit him in the schedule. Thanks.

## 2017-01-05 ENCOUNTER — Encounter: Payer: Self-pay | Admitting: Thoracic Surgery (Cardiothoracic Vascular Surgery)

## 2017-01-05 ENCOUNTER — Institutional Professional Consult (permissible substitution) (INDEPENDENT_AMBULATORY_CARE_PROVIDER_SITE_OTHER): Payer: 59 | Admitting: Thoracic Surgery (Cardiothoracic Vascular Surgery)

## 2017-01-05 VITALS — BP 122/82 | HR 98 | Resp 16 | Ht 67.5 in | Wt 202.0 lb

## 2017-01-05 DIAGNOSIS — R590 Localized enlarged lymph nodes: Secondary | ICD-10-CM | POA: Diagnosis not present

## 2017-01-05 LAB — VITAMIN D 25 HYDROXY (VIT D DEFICIENCY, FRACTURES): Vit D, 25-Hydroxy: 27 ng/mL — ABNORMAL LOW (ref 30–100)

## 2017-01-05 NOTE — Progress Notes (Signed)
PCP is Rita Ohara, MD Referring Provider is Rigoberto Noel, MD  Chief Complaint  Patient presents with  . Adenopathy    CT CHEST, eval for mediastinoscopy    HPI: 36 year old man sent for consultation regarding mediastinal adenopathy  Mr. Newsham is a 36 year old gentleman with a past medical history significant for type 1 diabetes, hypertension, hyperlipidemia, hypothyroidism, low testosterone, M.D. deficiency, and a right inguinal hernia. In July he developed a cough, wheezing and tightness in the chest. He was treated for asthmatic bronchitis. The wheezing improved but his cough persisted. A chest x-ray on 8/17 showed bilateral hilar adenopathy and a right lower lobe infiltrate. A repeat chest x-ray 10 days later showed persistent changes. A CT chest was done on 12/14/2016. Showed bilateral hilar and mediastinal adenopathy. There also was some adenopathy in the upper abdomen. There was consolidation at the right lung base in other areas of groundglass opacification. ACE level was 18.  He continues to have a cough. He did get some relief with prednisone as well as Hycodan. Says that he is no longer having wheezing. He does still have the sensation of chest tightness when he coughs. He denies fevers, chills, and night sweats. He has not had any significant weight loss.  Zubrod Score: At the time of surgery this patient's most appropriate activity status/level should be described as: [x]     0    Normal activity, no symptoms []     1    Restricted in physical strenuous activity but ambulatory, able to do out light work []     2    Ambulatory and capable of self care, unable to do work activities, up and about >50 % of waking hours                              []     3    Only limited self care, in bed greater than 50% of waking hours []     4    Completely disabled, no self care, confined to bed or chair []     5    Moribund  Past Medical History:  Diagnosis Date  . Diabetes mellitus without  complication ([place])   . Gastritis 2016   confirmed on bx on EGD. Dr. Hilarie Fredrickson  . Hyperlipidemia   . Hypertension   . Hypothyroidism   . Inguinal hernia    RT  . Low testosterone 2016   better on repeat in 2017  . Obesity   . Vitamin D deficiency     Past Surgical History:  Procedure Laterality Date  . INGUINAL HERNIA REPAIR Right 02/20/2015   Procedure: RIGHT INGUINAL HERNIA REPAIR WITH MESH;  Surgeon: Donnie Mesa, MD;  Location: [facility];  Service: General;  Laterality: Right;  . INSERTION OF MESH Right 02/20/2015   Procedure: INSERTION OF MESH;  Surgeon: Donnie Mesa, MD;  Location: [facility];  Service: General;  Laterality: Right;  . WISDOM TOOTH EXTRACTION      Family History  Problem Relation Age of Onset  . Hypertension Mother   . High Cholesterol Mother   . Diabetes Mother        borderline  . Diabetes Maternal Grandmother   . Diabetes Paternal Grandfather   . Heart attack Father 52  . High Cholesterol Father   . Diabetes Maternal Grandfather   . Cancer Paternal Uncle        throat cancer (smoker)    Social History Social  History  Substance Use Topics  . Smoking status: Never Smoker  . Smokeless tobacco: Never Used  . Alcohol use No    Current Outpatient Prescriptions  Medication Sig Dispense Refill  . acetaminophen (TYLENOL) 500 MG tablet Take 1,000 mg by mouth every 6 (six) hours as needed for moderate pain.    Marland Kitchen albuterol (PROVENTIL HFA;VENTOLIN HFA) 108 (90 Base) MCG/ACT inhaler Inhale 2 puffs into the lungs every 6 (six) hours as needed for wheezing or shortness of breath. 18 g 1  . atorvastatin (LIPITOR) 20 MG tablet TAKE 1 TABLET (20 MG TOTAL) BY MOUTH DAILY. 90 tablet 0  . Continuous Blood Gluc Sensor (FREESTYLE LIBRE SENSOR SYSTEM) MISC APPLY ONE SENSOR TO BACK OF UPPER ARM EVERY 10 DAYS  9  . glucose blood test strip Patient is to test BID   DX: E11.9 This is onetouch ultra test strips 200 each 3  . HYDROcodone-homatropine (HYCODAN) 5-1.5 MG/5ML syrup  Take 5 mLs by mouth every 8 (eight) hours as needed for cough. 120 mL 0  . levothyroxine (SYNTHROID, LEVOTHROID) 175 MCG tablet TAKE 1 TABLET BY MOUTH EVERY DAY IN THE MORNING ON EMPTY STOMACH.Marland Kitchen4:49 PM NOW 200 MCQ  3  . NOVOLOG FLEXPEN 100 UNIT/ML FlexPen     . ONE TOUCH ULTRA TEST test strip TEST SUGARS 2 HOURS AFTER MEALS DAILY 90 each 3  . pantoprazole (PROTONIX) 40 MG tablet Take 1 tablet 30 minutes before breakfast daily 90 tablet 3  . lisinopril (PRINIVIL,ZESTRIL) 5 MG tablet TAKE 1 TABLET (5 MG TOTAL) BY MOUTH DAILY. (Patient not taking: Reported on 01/05/2017) 90 tablet 1   No current facility-administered medications for this visit.     No Known Allergies  Review of Systems  Constitutional: Negative for activity change, chills, fever and unexpected weight change.  HENT: Negative for trouble swallowing and voice change.   Eyes: Negative for visual disturbance.  Respiratory: Positive for cough, chest tightness and wheezing.   Cardiovascular: Negative for chest pain.  Gastrointestinal: Negative for abdominal pain and blood in stool.  Endocrine: Negative for polydipsia and polyphagia.  Genitourinary: Negative for difficulty urinating and dysuria.  Musculoskeletal: Negative for arthralgias and myalgias.  Neurological: Negative for tremors, syncope and weakness.  Hematological: Negative for adenopathy. Does not bruise/bleed easily.  All other systems reviewed and are negative.   BP 122/82 (BP Location: Left Arm, Patient Position: Sitting, Cuff Size: Large)   Pulse 98   Resp 16   Ht 5' 7.5" (1.715 m)   Wt 202 lb (91.6 kg)   SpO2 95% Comment: ON RA  BMI 31.17 kg/m  Physical Exam  Constitutional: He is oriented to person, place, and time. He appears well-developed and well-nourished. No distress.  HENT:  Head: Normocephalic and atraumatic.  Mouth/Throat: No oropharyngeal exudate.  Eyes: Conjunctivae and EOM are normal. No scleral icterus.  Neck: No thyromegaly present.   Cardiovascular: Normal rate, regular rhythm, normal heart sounds and intact distal pulses.  Exam reveals no gallop and no friction rub.   No murmur heard. Pulmonary/Chest: Effort normal. No respiratory distress. He has no wheezes. He has no rales.  Abdominal: Soft. He exhibits no distension. There is no tenderness.  Musculoskeletal: He exhibits no edema or deformity.  Lymphadenopathy:    He has no cervical adenopathy.  Neurological: He is alert and oriented to person, place, and time. No cranial nerve deficit. He exhibits normal muscle tone.  Skin: Skin is warm and dry.  Vitals reviewed.    Diagnostic Tests: CT  CHEST WITH CONTRAST  TECHNIQUE: Multidetector CT imaging of the chest was performed during intravenous contrast administration.  CONTRAST:  50mL ISOVUE-300 IOPAMIDOL (ISOVUE-300) INJECTION 61%  COMPARISON:  Chest radiograph, 12/13/2016.  FINDINGS: Cardiovascular: Heart is normal in size and configuration. No coronary artery calcifications. Great vessels are normal in caliber. No aortic atherosclerosis.  Mediastinum/Nodes: There are multiple enlarged mediastinal and hilar lymph nodes. Prominent lymph nodes are seen in the superior mediastinum and anterior mediastinum. Largest prevascular node is 12 mm in short axis. Reference measurement of a right peritracheal, azygos level lymph node is 16 mm in short axis. A bulky right subcarinal node measures 2.4 cm short axis. Largest left hilar node measures 17 mm in short axis. There is a 2.7 cm noted along the superior right hilum. Largest inferior hilar node measures 18 mm in short axis.  The trachea is widely patent.  The esophagus is unremarkable.  Lungs/Pleura: There is focal consolidation in the inferior right lower lobe abutting the diaphragmatic pleural surface, measuring approximately 4.4 cm in extent. Peribronchovascular ground-glass opacities noted in both lower lobes, most evident on the left. There is a  small area of central peribronchovascular ground-glass opacity in the right middle lobe. Irregular focal opacity is noted in the left upper lobe lingula that is most likely scarring or atelectasis. A linear area of scarring or atelectasis is also noted in the anterior right upper lobe. No lung masses or discrete nodules. No evidence of pulmonary edema. No pleural effusion or pneumothorax.  Upper Abdomen: There are several prominent to mildly enlarged lymph nodes. A 13 mm short axis node lies just inferior and posterior to the gastroesophageal junction adjacent to the left diaphragmatic crus. Visualized structures of the upper abdomen are otherwise unremarkable.  Musculoskeletal: Unremarkable.  IMPRESSION: 1. As noted on the previous day's chest radiograph, there is mediastinal and bilateral hilar adenopathy. Although this could be reactive related to infection, the extent is atypical. Consider sarcoidosis or a lymphoproliferative disorder such as lymphoma in the differential diagnosis. 2. There are areas of lung opacity. The most dense consolidation, consistent with pneumonia, lies at the diaphragmatic base of the right lower lobe. There other areas of ground-glass peribronchovascular opacity there also likely due to infection.   Electronically Signed   By: Lajean Manes M.D.   On: 12/14/2016 19:08 I personally reviewed the CT chest and concur with the findings noted above.  Impression: Mr. Mantell is a 36 year old man with type 1 diabetes, hypertension, hyperlipidemia, and hypothyroidism who presented with a cough about 2 months ago. He has been found to have bilateral hilar and mediastinal adenopathy and a right lower lobe consolidation. The differential diagnosis includes atypical infections, sarcoidosis, and lymphoma. Of these I think sarcoidosis is by far the most likely, but he needs a tissue biopsy for definitive diagnosis.  I recommended we proceed with  mediastinoscopy for definitive diagnosis. I described the general nature of the operation to Mr. and Mrs. Stonehouse. He understands it would be done under general anesthesia. He understands the incision to be made. We will plan to do this on an outpatient basis. He will be out of work for a few days. I informed him of the indications, risks, benefits, and alternatives. He understands this is diagnostic and not therapeutic. He understands the risks include, but are not limited to death, MI, DVT, bleeding, possible need for transfusion, infection, possible need for thoracotomy, recurrent nerve injury leading to hoarseness, and a very minimal risk of stroke.  He accepts the  risks and wishes to proceed.  Plan: Mediastinoscopy on Thursday, 01/13/2017  Melrose Nakayama, MD [facility] [phone]

## 2017-01-06 ENCOUNTER — Other Ambulatory Visit: Payer: Self-pay | Admitting: *Deleted

## 2017-01-06 DIAGNOSIS — R590 Localized enlarged lymph nodes: Secondary | ICD-10-CM

## 2017-01-06 NOTE — Telephone Encounter (Signed)
Mediastinoscopy has been scheduled for 9/27 He needs a follow-up with me in the first week of October Okay to double

## 2017-01-06 NOTE — Telephone Encounter (Signed)
RA, is it ok to double book this patient?

## 2017-01-07 NOTE — Telephone Encounter (Signed)
Will add patient once RA's October schedule is fixed.

## 2017-01-10 ENCOUNTER — Encounter (HOSPITAL_COMMUNITY)
Admission: RE | Admit: 2017-01-10 | Discharge: 2017-01-10 | Disposition: A | Discharge status: Home / Self Care | Payer: 59 | Source: Ambulatory Visit | Attending: Thoracic Surgery (Cardiothoracic Vascular Surgery) | Admitting: Thoracic Surgery (Cardiothoracic Vascular Surgery)

## 2017-01-10 ENCOUNTER — Encounter (HOSPITAL_COMMUNITY): Payer: Self-pay

## 2017-01-10 ENCOUNTER — Ambulatory Visit (HOSPITAL_COMMUNITY)
Admission: RE | Admit: 2017-01-10 | Discharge: 2017-01-10 | Disposition: A | Discharge status: Home / Self Care | Payer: 59 | Source: Ambulatory Visit | Attending: Thoracic Surgery (Cardiothoracic Vascular Surgery) | Admitting: Thoracic Surgery (Cardiothoracic Vascular Surgery)

## 2017-01-10 DIAGNOSIS — R590 Localized enlarged lymph nodes: Secondary | ICD-10-CM

## 2017-01-10 DIAGNOSIS — R918 Other nonspecific abnormal finding of lung field: Secondary | ICD-10-CM | POA: Diagnosis not present

## 2017-01-10 HISTORY — DX: Cough, unspecified: R05.9

## 2017-01-10 HISTORY — DX: Cough: R05

## 2017-01-10 HISTORY — DX: Gastro-esophageal reflux disease without esophagitis: K21.9

## 2017-01-10 HISTORY — DX: Dyspnea, unspecified: R06.00

## 2017-01-10 LAB — CBC
HCT: 41.7 % (ref 39.0–52.0)
Hemoglobin: 14.2 g/dL (ref 13.0–17.0)
MCH: 29.2 pg (ref 26.0–34.0)
MCHC: 34.1 g/dL (ref 30.0–36.0)
MCV: 85.6 fL (ref 78.0–100.0)
Platelets: 314 10*3/uL (ref 150–400)
RBC: 4.87 MIL/uL (ref 4.22–5.81)
RDW: 13.2 % (ref 11.5–15.5)
WBC: 8.2 10*3/uL (ref 4.0–10.5)

## 2017-01-10 LAB — HEMOGLOBIN A1C
Hgb A1c MFr Bld: 7.3 % — ABNORMAL HIGH (ref 4.8–5.6)
Mean Plasma Glucose: 162.81 mg/dL

## 2017-01-10 LAB — COMPREHENSIVE METABOLIC PANEL
ALT: 26 U/L (ref 17–63)
AST: 24 U/L (ref 15–41)
Albumin: 3.9 g/dL (ref 3.5–5.0)
Alkaline Phosphatase: 65 U/L (ref 38–126)
Anion gap: 7 (ref 5–15)
BUN: 19 mg/dL (ref 6–20)
CO2: 26 mmol/L (ref 22–32)
Calcium: 9.6 mg/dL (ref 8.9–10.3)
Chloride: 104 mmol/L (ref 101–111)
Creatinine, Ser: 1.27 mg/dL — ABNORMAL HIGH (ref 0.61–1.24)
GFR calc Af Amer: >60 mL/min (ref >60)
GFR calc non Af Amer: >60 mL/min (ref >60)
Glucose, Bld: 114 mg/dL — ABNORMAL HIGH (ref 65–99)
Potassium: 3.9 mmol/L (ref 3.5–5.1)
Sodium: 137 mmol/L (ref 135–145)
Total Bilirubin: 0.7 mg/dL (ref 0.3–1.2)
Total Protein: 7.1 g/dL (ref 6.5–8.1)

## 2017-01-10 LAB — SURGICAL PCR SCREEN
MRSA, PCR: NEGATIVE
Staphylococcus aureus: POSITIVE — AB

## 2017-01-10 LAB — PROTIME-INR
INR: 1.01
Prothrombin Time: 13.2 seconds (ref 11.4–15.2)

## 2017-01-10 LAB — TYPE AND SCREEN
ABO/RH(D): O POS
Antibody Screen: NEGATIVE

## 2017-01-10 LAB — APTT: aPTT: 31 seconds (ref 24–36)

## 2017-01-10 LAB — ABO/RH: ABO/RH(D): O POS

## 2017-01-10 LAB — GLUCOSE, CAPILLARY: Glucose-Capillary: 137 mg/dL — ABNORMAL HIGH (ref 65–99)

## 2017-01-10 NOTE — Pre-Procedure Instructions (Signed)
    Alejandro Griffin  01/10/2017      [id number] [place], [place] - [address] [place] [address] Phone: [phone] Fax: [phone]    Your procedure is scheduled on 01/13/17.  Report to [facility] Admitting at 6 A.M.  Call this number if you have problems the morning of surgery:  [phone]   Remember:  Do not eat food or drink liquids after midnight.  Take these medicines the morning of surgery with A SIP OF WATER --all  Inhalers,synthroid,protonix,hydrocodone   Do not wear jewelry, make-up or nail polish.  Do not wear lotions, powders, or perfumes, or deoderant.  Do not shave 48 hours prior to surgery.  Men may shave face and neck.  Do not bring valuables to the hospital.  [facility] is not responsible for any belongings or valuables.  Contacts, dentures or bridgework may not be worn into surgery.  Leave your suitcase in the car.  After surgery it may be brought to your room.  For patients admitted to the hospital, discharge time will be determined by your treatment team.  Patients discharged the day of surgery will not be allowed to drive home.   Name and phone number of your driver:    Special instructions:  Do not take any aspirin,anti-inflammatories,vitamins,or herbal supplements 5-7 days prior to surgery.  Please read over the following fact sheets that you were given. MRSA Information

## 2017-01-11 NOTE — Progress Notes (Signed)
Anesthesia Chart Review:  Pt is a 36 year old male scheduled for mediastinoscopy on 01/13/2017 with Modesto Charon, MD  - PCP/endocrinologist Delrae Rend, MD  PMH includes:  HTN, DM type 1, hyperlipidemia, GERD. Never smoker. BMI 33. S/p R inguinal hernia repair 02/20/15.   Medications include: albuterol, lipitor, levothyroxine, lisinopril, novolog (on insulin pump), protonix. Pt to reach out to Dr. Buddy Duty about managing his insulin pump perioperatively.   BP 122/85   Pulse 98   Temp (!) 36.4 C   Resp 18   Ht 5' 7.5" (1.715 m)   Wt 212 lb 11.9 oz (96.5 kg)   SpO2 94%   BMI 32.83 kg/m   Preoperative labs reviewed.  HbA1c 7.3, glucose 114  CXR 01/10/17:  - Persistent bilateral hilar lymphadenopathy.  - Parenchymal consolidation at the right lung base is slightly more conspicuous today.  EKG 01/10/17: NSR.   If no changes, I anticipate pt can proceed with surgery as scheduled.   Willeen Cass, FNP-BC [facility]/Anesthesiology Phone: [phone] 01/11/2017 11:58 AM

## 2017-01-12 NOTE — Anesthesia Preprocedure Evaluation (Signed)
Anesthesia Evaluation  Patient identified by MRN, date of birth, ID band Patient awake    Reviewed: Allergy & Precautions, NPO status , Patient's Chart, lab work & pertinent test results  History of Anesthesia Complications Negative for: history of anesthetic complications  Airway Mallampati: II  TM Distance: >3 FB Neck ROM: Full    Dental  (+) Teeth Intact, Dental Advisory Given   Pulmonary shortness of breath,    breath sounds clear to auscultation       Cardiovascular hypertension, Pt. on medications  Rhythm:Regular Rate:Normal     Neuro/Psych  Headaches,    GI/Hepatic Neg liver ROS, GERD  Medicated and Controlled,  Endo/Other  diabetes, Insulin DependentHypothyroidism Morbid obesity  Renal/GU negative Renal ROS     Musculoskeletal   Abdominal (+) + obese,   Peds  Hematology negative hematology ROS (+)   Anesthesia Other Findings   Reproductive/Obstetrics                             Anesthesia Physical  Anesthesia Plan  ASA: II  Anesthesia Plan: General   Post-op Pain Management:    Induction: Intravenous  PONV Risk Score and Plan: 3 and Ondansetron, Dexamethasone and Midazolam  Airway Management Planned: Oral ETT  Additional Equipment:   Intra-op Plan:   Post-operative Plan: Extubation in OR  Informed Consent: I have reviewed the patients History and Physical, chart, labs and discussed the procedure including the risks, benefits and alternatives for the proposed anesthesia with the patient or authorized representative who has indicated his/her understanding and acceptance.   Dental advisory given  Plan Discussed with: CRNA and Surgeon  Anesthesia Plan Comments: (Plan routine monitors, GETA Pt declines TAP block)        Anesthesia Quick Evaluation

## 2017-01-13 ENCOUNTER — Ambulatory Visit (HOSPITAL_COMMUNITY): Payer: 59 | Admitting: Anesthesiology

## 2017-01-13 ENCOUNTER — Encounter (HOSPITAL_COMMUNITY)
Admission: RE | Disposition: A | Discharge status: Home / Self Care | Payer: Self-pay | Source: Ambulatory Visit | Attending: Thoracic Surgery (Cardiothoracic Vascular Surgery)

## 2017-01-13 ENCOUNTER — Encounter: Payer: Self-pay | Admitting: Family Medicine

## 2017-01-13 ENCOUNTER — Ambulatory Visit (HOSPITAL_COMMUNITY)
Admission: RE | Admit: 2017-01-13 | Discharge: 2017-01-13 | Disposition: A | Discharge status: Home / Self Care | Payer: 59 | Source: Ambulatory Visit | Attending: Thoracic Surgery (Cardiothoracic Vascular Surgery) | Admitting: Thoracic Surgery (Cardiothoracic Vascular Surgery)

## 2017-01-13 ENCOUNTER — Ambulatory Visit (HOSPITAL_COMMUNITY): Payer: 59 | Admitting: Emergency Medicine

## 2017-01-13 DIAGNOSIS — I10 Essential (primary) hypertension: Secondary | ICD-10-CM | POA: Diagnosis not present

## 2017-01-13 DIAGNOSIS — E109 Type 1 diabetes mellitus without complications: Secondary | ICD-10-CM | POA: Insufficient documentation

## 2017-01-13 DIAGNOSIS — E039 Hypothyroidism, unspecified: Secondary | ICD-10-CM | POA: Diagnosis not present

## 2017-01-13 DIAGNOSIS — E559 Vitamin D deficiency, unspecified: Secondary | ICD-10-CM | POA: Insufficient documentation

## 2017-01-13 DIAGNOSIS — J45909 Unspecified asthma, uncomplicated: Secondary | ICD-10-CM | POA: Insufficient documentation

## 2017-01-13 DIAGNOSIS — Z6831 Body mass index (BMI) 31.0-31.9, adult: Secondary | ICD-10-CM | POA: Diagnosis not present

## 2017-01-13 DIAGNOSIS — Z794 Long term (current) use of insulin: Secondary | ICD-10-CM | POA: Insufficient documentation

## 2017-01-13 DIAGNOSIS — R590 Localized enlarged lymph nodes: Secondary | ICD-10-CM | POA: Diagnosis not present

## 2017-01-13 DIAGNOSIS — K219 Gastro-esophageal reflux disease without esophagitis: Secondary | ICD-10-CM | POA: Insufficient documentation

## 2017-01-13 DIAGNOSIS — Z79899 Other long term (current) drug therapy: Secondary | ICD-10-CM | POA: Insufficient documentation

## 2017-01-13 DIAGNOSIS — E785 Hyperlipidemia, unspecified: Secondary | ICD-10-CM | POA: Diagnosis not present

## 2017-01-13 DIAGNOSIS — E669 Obesity, unspecified: Secondary | ICD-10-CM | POA: Diagnosis not present

## 2017-01-13 HISTORY — PX: MEDIASTINOSCOPY: SHX5086

## 2017-01-13 LAB — GLUCOSE, CAPILLARY
Glucose-Capillary: 267 mg/dL — ABNORMAL HIGH (ref 65–99)
Glucose-Capillary: 233 mg/dL — ABNORMAL HIGH (ref 65–99)
Glucose-Capillary: 240 mg/dL — ABNORMAL HIGH (ref 65–99)
Glucose-Capillary: 231 mg/dL — ABNORMAL HIGH (ref 65–99)

## 2017-01-13 SURGERY — MEDIASTINOSCOPY
Anesthesia: General

## 2017-01-13 MED ORDER — PROMETHAZINE HCL 25 MG/ML IJ SOLN: 6.2500 mg | INTRAMUSCULAR | Status: DC | PRN

## 2017-01-13 MED ORDER — OXYCODONE HCL 5 MG PO TABS: 5.0000 mg | ORAL_TABLET | Freq: Once | ORAL | Status: DC | PRN

## 2017-01-13 MED ORDER — PROPOFOL 10 MG/ML IV BOLUS
INTRAVENOUS | Status: AC
  Filled 2017-01-13: qty 20

## 2017-01-13 MED ORDER — DEXTROSE 5 % IV SOLN
INTRAVENOUS | Status: AC
  Filled 2017-01-13: qty 1.5

## 2017-01-13 MED ORDER — FENTANYL CITRATE (PF) 250 MCG/5ML IJ SOLN
INTRAMUSCULAR | Status: AC
  Filled 2017-01-13: qty 5

## 2017-01-13 MED ORDER — SUGAMMADEX SODIUM 500 MG/5ML IV SOLN
INTRAVENOUS | Status: DC | PRN
  Administered 2017-01-13: 200 mg via INTRAVENOUS

## 2017-01-13 MED ORDER — MIDAZOLAM HCL 2 MG/2ML IJ SOLN
INTRAMUSCULAR | Status: AC
  Filled 2017-01-13: qty 2

## 2017-01-13 MED ORDER — MEPERIDINE HCL 25 MG/ML IJ SOLN: 6.2500 mg | INTRAMUSCULAR | Status: DC | PRN

## 2017-01-13 MED ORDER — ROCURONIUM BROMIDE 100 MG/10ML IV SOLN
INTRAVENOUS | Status: DC | PRN
  Administered 2017-01-13: 50 mg via INTRAVENOUS

## 2017-01-13 MED ORDER — ONDANSETRON HCL 4 MG/2ML IJ SOLN
INTRAMUSCULAR | Status: DC | PRN
  Administered 2017-01-13: 4 mg via INTRAVENOUS

## 2017-01-13 MED ORDER — FENTANYL CITRATE (PF) 250 MCG/5ML IJ SOLN
INTRAMUSCULAR | Status: DC | PRN
  Administered 2017-01-13 (×2): 100 ug via INTRAVENOUS
  Administered 2017-01-13: 50 ug via INTRAVENOUS

## 2017-01-13 MED ORDER — LACTATED RINGERS IV SOLN
INTRAVENOUS | Status: DC | PRN
  Administered 2017-01-13 (×2): via INTRAVENOUS

## 2017-01-13 MED ORDER — OXYCODONE HCL 5 MG/5ML PO SOLN: 5.0000 mg | Freq: Once | ORAL | Status: DC | PRN

## 2017-01-13 MED ORDER — OXYCODONE HCL 5 MG PO TABS: 5.0000 mg | ORAL_TABLET | Freq: Four times a day (QID) | ORAL | 0 refills | Status: DC | PRN

## 2017-01-13 MED ORDER — HYDROMORPHONE HCL 1 MG/ML IJ SOLN: 0.2500 mg | INTRAMUSCULAR | Status: DC | PRN

## 2017-01-13 MED ORDER — 0.9 % SODIUM CHLORIDE (POUR BTL) OPTIME
TOPICAL | Status: DC | PRN
  Administered 2017-01-13: 1000 mL

## 2017-01-13 MED ORDER — DEXTROSE 5 % IV SOLN
1.5000 g | INTRAVENOUS | Status: AC
  Administered 2017-01-13: 1.5 g via INTRAVENOUS

## 2017-01-13 MED ORDER — LIDOCAINE HCL (CARDIAC) 20 MG/ML IV SOLN
INTRAVENOUS | Status: DC | PRN
  Administered 2017-01-13: 80 mg via INTRATRACHEAL

## 2017-01-13 MED ORDER — PROPOFOL 10 MG/ML IV BOLUS
INTRAVENOUS | Status: DC | PRN
  Administered 2017-01-13: 200 mg via INTRAVENOUS

## 2017-01-13 MED ORDER — OXYCODONE HCL 5 MG PO TABS: 5.0000 mg | ORAL_TABLET | Freq: Four times a day (QID) | ORAL | Status: DC | PRN

## 2017-01-13 MED ORDER — MIDAZOLAM HCL 5 MG/5ML IJ SOLN
INTRAMUSCULAR | Status: DC | PRN
  Administered 2017-01-13: 2 mg via INTRAVENOUS

## 2017-01-13 SURGICAL SUPPLY — 32 items
APPLIER CLIP LOGIC TI 5 (MISCELLANEOUS) IMPLANT
CANISTER SUCT 3000ML PPV (MISCELLANEOUS) ×2 IMPLANT
CLIP VESOCCLUDE MED 6/CT (CLIP) ×2 IMPLANT
CONT SPEC 4OZ CLIKSEAL STRL BL (MISCELLANEOUS) ×4 IMPLANT
COVER SURGICAL LIGHT HANDLE (MISCELLANEOUS) ×2 IMPLANT
DERMABOND ADVANCED (GAUZE/BANDAGES/DRESSINGS) ×1
DERMABOND ADVANCED .7 DNX12 (GAUZE/BANDAGES/DRESSINGS) ×1 IMPLANT
DRAPE CHEST BREAST 15X10 FENES (DRAPES) ×2 IMPLANT
ELECT REM PT RETURN 9FT ADLT (ELECTROSURGICAL) ×2
ELECTRODE REM PT RTRN 9FT ADLT (ELECTROSURGICAL) ×1 IMPLANT
GAUZE SPONGE 4X4 16PLY XRAY LF (GAUZE/BANDAGES/DRESSINGS) ×2 IMPLANT
GLOVE SURG SIGNA 7.5 PF LTX (GLOVE) ×2 IMPLANT
GOWN STRL REUS W/ TWL LRG LVL3 (GOWN DISPOSABLE) ×2 IMPLANT
GOWN STRL REUS W/ TWL XL LVL3 (GOWN DISPOSABLE) ×1 IMPLANT
GOWN STRL REUS W/TWL LRG LVL3 (GOWN DISPOSABLE) ×2
GOWN STRL REUS W/TWL XL LVL3 (GOWN DISPOSABLE) ×1
HEMOSTAT SURGICEL 2X14 (HEMOSTASIS) ×2 IMPLANT
KIT BASIN OR (CUSTOM PROCEDURE TRAY) ×2 IMPLANT
KIT ROOM TURNOVER OR (KITS) ×2 IMPLANT
NS IRRIG 1000ML POUR BTL (IV SOLUTION) ×2 IMPLANT
PACK GENERAL/GYN (CUSTOM PROCEDURE TRAY) ×2 IMPLANT
PAD ARMBOARD 7.5X6 YLW CONV (MISCELLANEOUS) ×4 IMPLANT
SPONGE INTESTINAL PEANUT (DISPOSABLE) ×2 IMPLANT
SUT SILK 2 0 (SUTURE)
SUT SILK 2-0 18XBRD TIE 12 (SUTURE) IMPLANT
SUT VIC AB 2-0 CT1 27 (SUTURE)
SUT VIC AB 2-0 CT1 TAPERPNT 27 (SUTURE) IMPLANT
SUT VIC AB 3-0 SH 18 (SUTURE) ×2 IMPLANT
SUT VICRYL 4-0 PS2 18IN ABS (SUTURE) ×2 IMPLANT
SYR 10ML LL (SYRINGE) ×2 IMPLANT
TOWEL GREEN STERILE (TOWEL DISPOSABLE) ×2 IMPLANT
WATER STERILE IRR 1000ML POUR (IV SOLUTION) IMPLANT

## 2017-01-13 NOTE — Discharge Instructions (Addendum)
Do not drive or engage in heavy physical activity for 48 hours  After that you may begin driving when you no longer require narcotics for pain control  You may shower tomorrow  There is a medical adhesive over the incision. It will begin to peel off in 10-14 days.  You have a prescription for oxycodone, a narcotic pain reliever. You may use as directed. You may use acetaminophen(Tylenol) or ibuprofen (Advil, Motrin) in addition to, or instead of, the oxycodone.  Call [phone] if you develop chest pain, shortness of breath, fever > 101 F, or if you notice redness, swelling or drainage from the incision  My office will contact you with a follow up appointment.  Follow up with Dr. Elsworth Soho

## 2017-01-13 NOTE — Brief Op Note (Signed)
01/13/2017  9:18 AM  PATIENT:  Alejandro Griffin  36 y.o. 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   Delay start of Pharmacological VTE agent (>24hrs) due to surgical blood loss or risk of bleeding: not applicable

## 2017-01-13 NOTE — Progress Notes (Signed)
notifed dr germeroth of pts bs 231 pt can rx self with own insulin pump

## 2017-01-13 NOTE — Anesthesia Procedure Notes (Signed)
Procedure Name: Intubation Date/Time: 01/13/2017 8:11 AM Performed by: Mariea Clonts Pre-anesthesia Checklist: Patient identified, Emergency Drugs available, Suction available and Patient being monitored Patient Re-evaluated:Patient Re-evaluated prior to induction Oxygen Delivery Method: Circle System Utilized Preoxygenation: Pre-oxygenation with 100% oxygen Induction Type: IV induction Ventilation: Mask ventilation without difficulty Laryngoscope Size: Miller and 2 Grade View: Grade I Tube type: Oral Tube size: 7.5 mm Number of attempts: 1 Airway Equipment and Method: Stylet and Oral airway Placement Confirmation: ETT inserted through vocal cords under direct vision,  positive ETCO2 and breath sounds checked- equal and bilateral Tube secured with: Tape Dental Injury: Teeth and Oropharynx as per pre-operative assessment

## 2017-01-13 NOTE — H&P (View-Only) (Signed)
PCP is Rita Ohara, MD Referring Provider is Rigoberto Noel, MD  Chief Complaint  Patient presents with  . Adenopathy    CT CHEST, eval for mediastinoscopy    HPI: 36 year old man sent for consultation regarding mediastinal adenopathy  Mr. Alejandro Griffin is a 36 year old gentleman with a past medical history significant for type 1 diabetes, hypertension, hyperlipidemia, hypothyroidism, low testosterone, M.D. deficiency, and a right inguinal hernia. In July he developed a cough, wheezing and tightness in the chest. He was treated for asthmatic bronchitis. The wheezing improved but his cough persisted. A chest x-ray on 8/17 showed bilateral hilar adenopathy and a right lower lobe infiltrate. A repeat chest x-ray 10 days later showed persistent changes. A CT chest was done on 12/14/2016. Showed bilateral hilar and mediastinal adenopathy. There also was some adenopathy in the upper abdomen. There was consolidation at the right lung base in other areas of groundglass opacification. ACE level was 18.  He continues to have a cough. He did get some relief with prednisone as well as Hycodan. Says that he is no longer having wheezing. He does still have the sensation of chest tightness when he coughs. He denies fevers, chills, and night sweats. He has not had any significant weight loss.  Zubrod Score: At the time of surgery this patient's most appropriate activity status/level should be described as: [x]     0    Normal activity, no symptoms []     1    Restricted in physical strenuous activity but ambulatory, able to do out light work []     2    Ambulatory and capable of self care, unable to do work activities, up and about >50 % of waking hours                              []     3    Only limited self care, in bed greater than 50% of waking hours []     4    Completely disabled, no self care, confined to bed or chair []     5    Moribund  Past Medical History:  Diagnosis Date  . Diabetes mellitus without  complication ([place])   . Gastritis 2016   confirmed on bx on EGD. Dr. Hilarie Fredrickson  . Hyperlipidemia   . Hypertension   . Hypothyroidism   . Inguinal hernia    RT  . Low testosterone 2016   better on repeat in 2017  . Obesity   . Vitamin D deficiency     Past Surgical History:  Procedure Laterality Date  . INGUINAL HERNIA REPAIR Right 02/20/2015   Procedure: RIGHT INGUINAL HERNIA REPAIR WITH MESH;  Surgeon: Donnie Mesa, MD;  Location: [facility];  Service: General;  Laterality: Right;  . INSERTION OF MESH Right 02/20/2015   Procedure: INSERTION OF MESH;  Surgeon: Donnie Mesa, MD;  Location: [facility];  Service: General;  Laterality: Right;  . WISDOM TOOTH EXTRACTION      Family History  Problem Relation Age of Onset  . Hypertension Mother   . High Cholesterol Mother   . Diabetes Mother        borderline  . Diabetes Maternal Grandmother   . Diabetes Paternal Grandfather   . Heart attack Father 35  . High Cholesterol Father   . Diabetes Maternal Grandfather   . Cancer Paternal Uncle        throat cancer (smoker)    Social History Social  History  Substance Use Topics  . Smoking status: Never Smoker  . Smokeless tobacco: Never Used  . Alcohol use No    Current Outpatient Prescriptions  Medication Sig Dispense Refill  . acetaminophen (TYLENOL) 500 MG tablet Take 1,000 mg by mouth every 6 (six) hours as needed for moderate pain.    Marland Kitchen albuterol (PROVENTIL HFA;VENTOLIN HFA) 108 (90 Base) MCG/ACT inhaler Inhale 2 puffs into the lungs every 6 (six) hours as needed for wheezing or shortness of breath. 18 g 1  . atorvastatin (LIPITOR) 20 MG tablet TAKE 1 TABLET (20 MG TOTAL) BY MOUTH DAILY. 90 tablet 0  . Continuous Blood Gluc Sensor (FREESTYLE LIBRE SENSOR SYSTEM) MISC APPLY ONE SENSOR TO BACK OF UPPER ARM EVERY 10 DAYS  9  . glucose blood test strip Patient is to test BID   DX: E11.9 This is onetouch ultra test strips 200 each 3  . HYDROcodone-homatropine (HYCODAN) 5-1.5 MG/5ML syrup  Take 5 mLs by mouth every 8 (eight) hours as needed for cough. 120 mL 0  . levothyroxine (SYNTHROID, LEVOTHROID) 175 MCG tablet TAKE 1 TABLET BY MOUTH EVERY DAY IN THE MORNING ON EMPTY STOMACH.Marland Kitchen4:49 PM NOW 200 MCQ  3  . NOVOLOG FLEXPEN 100 UNIT/ML FlexPen     . ONE TOUCH ULTRA TEST test strip TEST SUGARS 2 HOURS AFTER MEALS DAILY 90 each 3  . pantoprazole (PROTONIX) 40 MG tablet Take 1 tablet 30 minutes before breakfast daily 90 tablet 3  . lisinopril (PRINIVIL,ZESTRIL) 5 MG tablet TAKE 1 TABLET (5 MG TOTAL) BY MOUTH DAILY. (Patient not taking: Reported on 01/05/2017) 90 tablet 1   No current facility-administered medications for this visit.     No Known Allergies  Review of Systems  Constitutional: Negative for activity change, chills, fever and unexpected weight change.  HENT: Negative for trouble swallowing and voice change.   Eyes: Negative for visual disturbance.  Respiratory: Positive for cough, chest tightness and wheezing.   Cardiovascular: Negative for chest pain.  Gastrointestinal: Negative for abdominal pain and blood in stool.  Endocrine: Negative for polydipsia and polyphagia.  Genitourinary: Negative for difficulty urinating and dysuria.  Musculoskeletal: Negative for arthralgias and myalgias.  Neurological: Negative for tremors, syncope and weakness.  Hematological: Negative for adenopathy. Does not bruise/bleed easily.  All other systems reviewed and are negative.   BP 122/82 (BP Location: Left Arm, Patient Position: Sitting, Cuff Size: Large)   Pulse 98   Resp 16   Ht 5' 7.5" (1.715 m)   Wt 202 lb (91.6 kg)   SpO2 95% Comment: ON RA  BMI 31.17 kg/m  Physical Exam  Constitutional: He is oriented to person, place, and time. He appears well-developed and well-nourished. No distress.  HENT:  Head: Normocephalic and atraumatic.  Mouth/Throat: No oropharyngeal exudate.  Eyes: Conjunctivae and EOM are normal. No scleral icterus.  Neck: No thyromegaly present.   Cardiovascular: Normal rate, regular rhythm, normal heart sounds and intact distal pulses.  Exam reveals no gallop and no friction rub.   No murmur heard. Pulmonary/Chest: Effort normal. No respiratory distress. He has no wheezes. He has no rales.  Abdominal: Soft. He exhibits no distension. There is no tenderness.  Musculoskeletal: He exhibits no edema or deformity.  Lymphadenopathy:    He has no cervical adenopathy.  Neurological: He is alert and oriented to person, place, and time. No cranial nerve deficit. He exhibits normal muscle tone.  Skin: Skin is warm and dry.  Vitals reviewed.    Diagnostic Tests: CT  CHEST WITH CONTRAST  TECHNIQUE: Multidetector CT imaging of the chest was performed during intravenous contrast administration.  CONTRAST:  51mL ISOVUE-300 IOPAMIDOL (ISOVUE-300) INJECTION 61%  COMPARISON:  Chest radiograph, 12/13/2016.  FINDINGS: Cardiovascular: Heart is normal in size and configuration. No coronary artery calcifications. Great vessels are normal in caliber. No aortic atherosclerosis.  Mediastinum/Nodes: There are multiple enlarged mediastinal and hilar lymph nodes. Prominent lymph nodes are seen in the superior mediastinum and anterior mediastinum. Largest prevascular node is 12 mm in short axis. Reference measurement of a right peritracheal, azygos level lymph node is 16 mm in short axis. A bulky right subcarinal node measures 2.4 cm short axis. Largest left hilar node measures 17 mm in short axis. There is a 2.7 cm noted along the superior right hilum. Largest inferior hilar node measures 18 mm in short axis.  The trachea is widely patent.  The esophagus is unremarkable.  Lungs/Pleura: There is focal consolidation in the inferior right lower lobe abutting the diaphragmatic pleural surface, measuring approximately 4.4 cm in extent. Peribronchovascular ground-glass opacities noted in both lower lobes, most evident on the left. There is a  small area of central peribronchovascular ground-glass opacity in the right middle lobe. Irregular focal opacity is noted in the left upper lobe lingula that is most likely scarring or atelectasis. A linear area of scarring or atelectasis is also noted in the anterior right upper lobe. No lung masses or discrete nodules. No evidence of pulmonary edema. No pleural effusion or pneumothorax.  Upper Abdomen: There are several prominent to mildly enlarged lymph nodes. A 13 mm short axis node lies just inferior and posterior to the gastroesophageal junction adjacent to the left diaphragmatic crus. Visualized structures of the upper abdomen are otherwise unremarkable.  Musculoskeletal: Unremarkable.  IMPRESSION: 1. As noted on the previous day's chest radiograph, there is mediastinal and bilateral hilar adenopathy. Although this could be reactive related to infection, the extent is atypical. Consider sarcoidosis or a lymphoproliferative disorder such as lymphoma in the differential diagnosis. 2. There are areas of lung opacity. The most dense consolidation, consistent with pneumonia, lies at the diaphragmatic base of the right lower lobe. There other areas of ground-glass peribronchovascular opacity there also likely due to infection.   Electronically Signed   By: Lajean Manes M.D.   On: 12/14/2016 19:08 I personally reviewed the CT chest and concur with the findings noted above.  Impression: Mr. Langhorst is a 36 year old man with type 1 diabetes, hypertension, hyperlipidemia, and hypothyroidism who presented with a cough about 2 months ago. He has been found to have bilateral hilar and mediastinal adenopathy and a right lower lobe consolidation. The differential diagnosis includes atypical infections, sarcoidosis, and lymphoma. Of these I think sarcoidosis is by far the most likely, but he needs a tissue biopsy for definitive diagnosis.  I recommended we proceed with  mediastinoscopy for definitive diagnosis. I described the general nature of the operation to Mr. and Mrs. Raygoza. He understands it would be done under general anesthesia. He understands the incision to be made. We will plan to do this on an outpatient basis. He will be out of work for a few days. I informed him of the indications, risks, benefits, and alternatives. He understands this is diagnostic and not therapeutic. He understands the risks include, but are not limited to death, MI, DVT, bleeding, possible need for transfusion, infection, possible need for thoracotomy, recurrent nerve injury leading to hoarseness, and a very minimal risk of stroke.  He accepts the  risks and wishes to proceed.  Plan: Mediastinoscopy on Thursday, 01/13/2017  Melrose Nakayama, MD [facility] [phone]

## 2017-01-13 NOTE — Op Note (Signed)
NAME:  Alejandro Griffin, Alejandro Griffin NO.:  [id number]  MEDICAL RECORD NO.:  [id number]  LOCATION:                                 FACILITY:  PHYSICIAN:  Revonda Standard. Roxan Hockey, M.D. DATE OF BIRTH:  DATE OF PROCEDURE:  01/13/2017 DATE OF DISCHARGE:                              OPERATIVE REPORT   PREOPERATIVE DIAGNOSIS:  Mediastinal adenopathy.  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  PROCEDURE:  Mediastinoscopy.  SURGEON:  Revonda Standard. Roxan Hockey, MD.  ANESTHESIA:  General.  FINDINGS:  Multiple enlarged lymph nodes. Frozen section showed noncaseating granulomas.  CLINICAL NOTE:  Mr. [name] is a 36 year old gentleman who first became ill in July when he developed a cough, wheezing, and tightness in the chest.  He was treated for possible bronchitis.  His wheezing improved, but his cough persisted.  He was found to have right lower lobe infiltrate and bilateral hilar adenopathy.  A CT showed bilateral hilar and mediastinal adenopathy as well as a right lower lobe infiltrate. There also was adenopathy in the upper abdomen.  He was referred for mediastinoscopy for diagnostic purposes.  The indications, risks, benefits, and alternatives were discussed in detail with the patient. He accepted the risks and agreed to proceed.  OPERATIVE NOTE:  Mr. [name] was brought to the operating room on January 13, 2017.  He had induction of general anesthesia and was intubated.  The neck and chest were prepped and draped in usual sterile fashion.  After performing a time-out, a transverse incision was made 1 fingerbreadth above the sternal notch.  It was carried through the skin and subcutaneous tissue.  Hemostasis was achieved with electrocautery. The strap muscles were separated in the midline.  The pretracheal fascia was identified and incised.  The pretracheal plane was developed bluntly into the mediastinum.  Mediastinoscope was inserted.  In the  right paratracheal area, there was a small node in the 4R location, which was removed.  Further down, there was a much larger node that was gray in color and granular in texture.  Biopsies were taken and sent for frozen section.  The remainder of the node was sent for permanent pathology. In the subcarinal space, there was an enlarged node, which was removed and sent for permanent pathology as well.  Finally, a third level 4R node was removed and sent for pathology.  There was minimal bleeding with the biopsies.  Cautery was used sparingly.  The wound was packed with gauze for 10 minutes.  The gauze packing was removed.  The mediastinoscope was reinserted.  There was no bleeding.  The mediastinoscope was removed.  The incision was closed with interrupted 3- 0 Vicryl sutures in the platysmal layer followed by a 4-0 Vicryl subcuticular suture.  Dermabond was applied.  Frozen section returned showing noncaseating granulomas, most likely sarcoidosis.  All sponge, needle, and instrument counts were correct at the end of the procedure.  The patient was extubated in the operating room and taken to the postanesthetic care unit in good condition.     Revonda Standard Roxan Hockey, M.D.     SCH/MEDQ  D:  01/13/2017  T:  01/13/2017  Job:  [id number]

## 2017-01-13 NOTE — Transfer of Care (Signed)
Immediate Anesthesia Transfer of Care Note  Patient: Alejandro Griffin  Procedure(s) Performed: Procedure(s): MEDIASTINOSCOPY (N/A)  Patient Location: PACU  Anesthesia Type:General  Level of Consciousness: awake, alert  and oriented  Airway & Oxygen Therapy: Patient Spontanous Breathing and Patient connected to nasal cannula oxygen  Post-op Assessment: Report given to RN, Post -op Vital signs reviewed and stable and Patient moving all extremities X 4  Post vital signs: Reviewed and stable  Last Vitals:  Vitals:   01/13/17 0626  BP: 123/78  Pulse: 90  Resp: 20  Temp: 37.1 C  SpO2: 96%    Last Pain: There were no vitals filed for this visit.    Patients Stated Pain Goal: 3 (49/32/41 [id number])  Complications: No apparent anesthesia complications

## 2017-01-13 NOTE — Interval H&P Note (Signed)
History and Physical Interval Note:  01/13/2017 7:54 AM  Alejandro Griffin  has presented today for surgery, with the diagnosis of MEDIASTINAL ADENOPATHY  The various methods of treatment have been discussed with the patient and family. After consideration of risks, benefits and other options for treatment, the patient has consented to  Procedure(s): MEDIASTINOSCOPY (N/A) as a surgical intervention .  The patient's history has been reviewed, patient examined, no change in status, stable for surgery.  I have reviewed the patient's chart and labs.  Questions were answered to the patient's satisfaction.     Melrose Nakayama

## 2017-01-14 ENCOUNTER — Encounter (HOSPITAL_COMMUNITY): Payer: Self-pay | Admitting: Thoracic Surgery (Cardiothoracic Vascular Surgery)

## 2017-01-14 LAB — ACID FAST SMEAR (AFB, MYCOBACTERIA): Acid Fast Smear: NEGATIVE

## 2017-01-14 NOTE — Anesthesia Postprocedure Evaluation (Signed)
Anesthesia Post Note  Patient: Alejandro Griffin  Procedure(s) Performed: Procedure(s) (LRB): MEDIASTINOSCOPY (N/A)     Patient location during evaluation: PACU Anesthesia Type: General Level of consciousness: sedated and patient cooperative Pain management: pain level controlled Vital Signs Assessment: post-procedure vital signs reviewed and stable Respiratory status: spontaneous breathing Cardiovascular status: stable Anesthetic complications: no    Last Vitals:  Vitals:   01/13/17 1045 01/13/17 1100  BP: 132/85 (!) 138/97  Pulse: 85 91  Resp: 16 18  Temp:  36.6 C  SpO2: 92% 95%    Last Pain:  Vitals:   01/13/17 1100  PainSc: 0-No pain                 Nolon Nations

## 2017-01-21 ENCOUNTER — Ambulatory Visit (INDEPENDENT_AMBULATORY_CARE_PROVIDER_SITE_OTHER): Payer: 59 | Admitting: Pulmonary Disease

## 2017-01-21 ENCOUNTER — Encounter: Payer: Self-pay | Admitting: Pulmonary Disease

## 2017-01-21 VITALS — BP 118/72 | HR 100 | Ht 67.5 in | Wt 205.0 lb

## 2017-01-21 DIAGNOSIS — D869 Sarcoidosis, unspecified: Secondary | ICD-10-CM | POA: Diagnosis not present

## 2017-01-21 DIAGNOSIS — Z23 Encounter for immunization: Secondary | ICD-10-CM | POA: Diagnosis not present

## 2017-01-21 DIAGNOSIS — R05 Cough: Secondary | ICD-10-CM

## 2017-01-21 DIAGNOSIS — R059 Cough, unspecified: Secondary | ICD-10-CM

## 2017-01-21 MED ORDER — PREDNISONE 10 MG PO TABS: 10.0000 mg | ORAL_TABLET | Freq: Every day | ORAL | 1 refills | Status: DC

## 2017-01-21 MED ORDER — HYDROCODONE-HOMATROPINE 5-1.5 MG/5ML PO SYRP: 5.0000 mL | ORAL_SOLUTION | Freq: Three times a day (TID) | ORAL | 0 refills | Status: DC | PRN

## 2017-01-21 NOTE — Progress Notes (Signed)
   Subjective:    Patient ID: Alejandro Griffin, male    DOB: September 19, 1980, 36 y.o.   MRN: [id number]  HPI  36 year old type I diabetic, never smoker For follow-up of mediastinal lymphadenopathy  He underwent mediastinoscopy biopsy of 4R lymph node which showed noncaseating granulomas, AFB and fungal stains are negative  He continues to have dry cough, no chest pain or fevers  Significant tests/ events reviewed  CT chest  12/14/16 which confirmed mediastinal and bilateral hilar lymphadenopathy and also picked up some lymph nodes in the abdomen. There is an area of consolidation in the right lung base and other areas of groundglass opacification.  ACE level was 18      Review of Systems neg for any significant sore throat, dysphagia, itching, sneezing, nasal congestion or excess/ purulent secretions, fever, chills, sweats, unintended wt loss, pleuritic or exertional cp, hempoptysis, orthopnea pnd or change in chronic leg swelling.   Also denies presyncope, palpitations, heartburn, abdominal pain, nausea, vomiting, diarrhea or change in bowel or urinary habits, dysuria,hematuria, rash, arthralgias, visual complaints, headache, numbness weakness or ataxia.     Objective:   Physical Exam  Gen. Pleasant, well-nourished, in no distress ENT - no thrush, no post nasal drip Neck: No JVD, no thyromegaly, no carotid bruits Lungs: no use of accessory muscles, no dullness to percussion, clear without rales or rhonchi  Cardiovascular: Rhythm regular, heart sounds  normal, no murmurs or gallops, no peripheral edema Musculoskeletal: No deformities, no cyanosis or clubbing        Assessment & Plan:

## 2017-01-21 NOTE — Assessment & Plan Note (Signed)
Findings noncaseating granulomas confirms diagnosis of sarcoidosis. Due to his persistent cough we will treat this. Concern here is for rising sugars with prednisone will start cautiously with lower dose of 10 mg and titrate based on symptom relief and sugars  Schedule PFTs. Eye  exam to rule out sarcoidosis.   Prednisone 10 mg daily # 60 X 1 refill Call me in 2 weeks to report how sugars are doing Okay to refill Hycodan cough syrup 1  Flu shot today  Chest x-ray on follow-up visit

## 2017-01-21 NOTE — Addendum Note (Signed)
Addended by: Lorretta Harp on: 01/21/2017 04:36 PM   Modules accepted: Orders

## 2017-01-21 NOTE — Patient Instructions (Addendum)
Schedule PFTs. Eye  exam to rule out sarcoidosis.   Prednisone 10 mg daily # 60 X 1 refill Call me in 2 weeks to report how sugars are doing  Flu shot today  Chest x-ray on follow-up visit

## 2017-01-26 ENCOUNTER — Encounter: Payer: Self-pay | Admitting: Family Medicine

## 2017-01-26 ENCOUNTER — Telehealth: Payer: Self-pay | Admitting: Pulmonary Disease

## 2017-01-26 NOTE — Telephone Encounter (Signed)
Patient was seen by RA on 01/21/17. He wanted a refill on his Hycodan cough syrup but did not receive it that day. Called patient to ask if he wanted to have this RX mailed to him or stop by the office to pick it up. He stated that he will stop by the office later today to pick this up.   Nothing else needed at time of call.

## 2017-01-31 LAB — HM DIABETES EYE EXAM

## 2017-02-03 ENCOUNTER — Encounter: Payer: Self-pay | Admitting: *Deleted

## 2017-02-09 ENCOUNTER — Encounter: Payer: Self-pay | Admitting: Pulmonary Disease

## 2017-02-09 NOTE — Telephone Encounter (Signed)
Please ask him to increase prednisone to 20 mg for 2 weeks, then back down to 10 mg daily

## 2017-02-10 ENCOUNTER — Encounter: Payer: Self-pay | Admitting: Family Medicine

## 2017-02-10 NOTE — Telephone Encounter (Signed)
Per December 2017 visit with Audelia Acton: "Snores.  Doesn't always awake rested, no witnessed apnea.  Does report fatigue all the time.  He and wife discussed this and he may want sleep study as we had mentioned on prior visit."  Not sure the availability for sleep studies, nor do I know which type his insurance will cover.  Sending note back to pt to check with insurance to see which is covered Audelia Acton had asked him to check back then).

## 2017-02-11 LAB — FUNGUS CULTURE RESULT

## 2017-02-11 LAB — FUNGUS CULTURE WITH STAIN

## 2017-02-11 LAB — FUNGAL ORGANISM REFLEX

## 2017-02-23 ENCOUNTER — Encounter: Payer: Self-pay | Admitting: Family Medicine

## 2017-02-23 ENCOUNTER — Other Ambulatory Visit: Payer: Self-pay | Admitting: *Deleted

## 2017-02-23 DIAGNOSIS — R400 Somnolence: Secondary | ICD-10-CM

## 2017-02-23 DIAGNOSIS — Z7689 Persons encountering health services in other specified circumstances: Secondary | ICD-10-CM

## 2017-02-23 DIAGNOSIS — R0683 Snoring: Secondary | ICD-10-CM

## 2017-02-25 ENCOUNTER — Ambulatory Visit (INDEPENDENT_AMBULATORY_CARE_PROVIDER_SITE_OTHER): Payer: 59 | Admitting: Adult Health

## 2017-02-25 ENCOUNTER — Encounter: Payer: Self-pay | Admitting: Adult Health

## 2017-02-25 ENCOUNTER — Ambulatory Visit (INDEPENDENT_AMBULATORY_CARE_PROVIDER_SITE_OTHER)
Admission: RE | Admit: 2017-02-25 | Discharge: 2017-02-25 | Disposition: A | Discharge status: Home / Self Care | Payer: 59 | Source: Ambulatory Visit | Attending: Pulmonary Disease | Admitting: Pulmonary Disease

## 2017-02-25 ENCOUNTER — Ambulatory Visit (INDEPENDENT_AMBULATORY_CARE_PROVIDER_SITE_OTHER): Payer: 59 | Admitting: Pulmonary Disease

## 2017-02-25 VITALS — BP 142/78 | HR 86 | Ht 69.0 in | Wt 209.0 lb

## 2017-02-25 DIAGNOSIS — R059 Cough, unspecified: Secondary | ICD-10-CM

## 2017-02-25 DIAGNOSIS — Z23 Encounter for immunization: Secondary | ICD-10-CM | POA: Diagnosis not present

## 2017-02-25 DIAGNOSIS — R05 Cough: Secondary | ICD-10-CM

## 2017-02-25 DIAGNOSIS — R9389 Abnormal findings on diagnostic imaging of other specified body structures: Secondary | ICD-10-CM | POA: Diagnosis not present

## 2017-02-25 DIAGNOSIS — D869 Sarcoidosis, unspecified: Secondary | ICD-10-CM

## 2017-02-25 LAB — PULMONARY FUNCTION TEST
DL/VA % pred: 114 %
DL/VA: 5.29 ml/min/mmHg/L
DLCO cor % pred: 96 %
DLCO cor: 29.86 ml/min/mmHg
DLCO unc % pred: 98 %
DLCO unc: 30.59 ml/min/mmHg
FEF 25-75 Post: 4.7 L/sec
FEF 25-75 Pre: 4.99 L/sec
FEF2575-%Change-Post: -5 %
FEF2575-%Pred-Post: 117 %
FEF2575-%Pred-Pre: 125 %
FEV1-%Change-Post: -2 %
FEV1-%Pred-Post: 91 %
FEV1-%Pred-Pre: 93 %
FEV1-Post: 3.78 L
FEV1-Pre: 3.88 L
FEV1FVC-%Change-Post: 0 %
FEV1FVC-%Pred-Pre: 108 %
FEV6-%Change-Post: -5 %
FEV6-%Pred-Post: 83 %
FEV6-%Pred-Pre: 88 %
FEV6-Post: 4.23 L
FEV6-Pre: 4.48 L
FEV6FVC-%Pred-Post: 102 %
FEV6FVC-%Pred-Pre: 102 %
FVC-%Change-Post: -3 %
FVC-%Pred-Post: 83 %
FVC-%Pred-Pre: 86 %
FVC-Post: 4.34 L
FVC-Pre: 4.48 L
Post FEV1/FVC ratio: 87 %
Post FEV6/FVC ratio: 100 %
Pre FEV1/FVC ratio: 87 %
Pre FEV6/FVC Ratio: 100 %
RV % pred: 50 %
RV: 0.88 L
TLC % pred: 78 %
TLC: 5.27 L

## 2017-02-25 NOTE — Patient Instructions (Addendum)
Decrease Prednisone 15 mg daily for 2 weeks then 10mg  daily . Hold at this dose.  Delsym 2 tsp Twice daily  .As needed  Cough.  Follow up with Dr. Elsworth Soho  In 4 weeks with chest xray and As needed   Please contact office for sooner follow up if symptoms do not improve or worsen or seek emergency care  Pneumovax today .

## 2017-02-25 NOTE — Progress Notes (Signed)
@Patient  ID: Alejandro Griffin, male    DOB: 1981-01-07, 36 y.o.   MRN: [id number]  Chief Complaint  Patient presents with  . Follow-up    Sarcoid     Referring provider: Rita Ohara, MD  HPI: 36 year old male never smoker seen for mediastinal lymphadenopathy found to have sarcoidosis on biopsy (September 2018)  TEST  CT chest  12/14/16 which confirmed mediastinal and bilateral hilar lymphadenopathy and also picked up some lymph nodes in the abdomen.There is an area of consolidation in the right lung base and other areas of groundglass opacification.  ACE level was 18  mediastinoscopy biopsy of 4R lymph node which showed noncaseating granulomas, AFB and fungal stains are negative  02/25/2017 Follow up: Sarcoid  Patient presents for one-month follow-up.  Patient was recently diagnosed with sarcoidosis.  He had a CT chest that showed mediastinal and bilateral hilar lymphadenopathy.  He underwent a mediastinoscopy in September that showed noncaseating granulomas  Consistent with sarcoidosis . cultures were negative.  Patient was started on prednisone Last visit.  Patient is a type I diabetic and is on insulin pump.  He was started on a 10 mg over 2 weeks he said his blood sugars remained stable.  Prednisone was increased to 20 mg daily.  He has been on that for the last 2 weeks.  He has had an occasional sugar over 250 but for the most part says his sugars have been stable.  Patient says his cough has decreased.  He has established with a ophthalmologist and had a recent appointment.  He denies any rash joint pain hemoptysis orthopnea PND or leg swelling.  Pulmonary function test today showed normal lung function.  FEV1 93%, ratio 87, FVC 86%.  DLCO 98%.  No significant bronchodilator response. CXR today shows persistent consolidation area in Right base ? Scarring .  Pt is on ACE inhibitor     No Known Allergies  Immunization History  Administered Date(s) Administered  .  Influenza,inj,Quad PF,6+ Mos 01/24/2013, 02/28/2014, 01/16/2016, 01/21/2017  . Tdap 07/18/2004, 07/19/2014    Past Medical History:  Diagnosis Date  . Cough   . Diabetes mellitus without complication ([place])   . Dyspnea   . Gastritis 2016   confirmed on bx on EGD. Dr. Hilarie Fredrickson  . GERD (gastroesophageal reflux disease)   . Hyperlipidemia   . Hypertension   . Hypothyroidism   . Inguinal hernia    RT  . Low testosterone 2016   better on repeat in 2017  . Obesity   . Vitamin D deficiency     Tobacco History: Social History   Tobacco Use  Smoking Status Never Smoker  Smokeless Tobacco Never Used   Counseling given: Not Answered   Outpatient Encounter Medications as of 02/25/2017  Medication Sig  . acetaminophen (TYLENOL) 500 MG tablet Take 1,000 mg by mouth every 6 (six) hours as needed for moderate pain.  Marland Kitchen albuterol (PROVENTIL HFA;VENTOLIN HFA) 108 (90 Base) MCG/ACT inhaler Inhale 2 puffs into the lungs every 6 (six) hours as needed for wheezing or shortness of breath.  Marland Kitchen atorvastatin (LIPITOR) 20 MG tablet TAKE 1 TABLET (20 MG TOTAL) BY MOUTH DAILY.  Marland Kitchen Continuous Blood Gluc Sensor (FREESTYLE LIBRE SENSOR SYSTEM) MISC APPLY ONE SENSOR TO BACK OF UPPER ARM EVERY 10 DAYS  . HYDROcodone-homatropine (HYCODAN) 5-1.5 MG/5ML syrup Take 5 mLs by mouth every 8 (eight) hours as needed for cough.  . levothyroxine (SYNTHROID, LEVOTHROID) 200 MCG tablet TAKE 1 TABLET (260mcg) BY MOUTH EVERY DAY  IN THE MORNING ON EMPTY STOMACH.Marland Kitchen4:49 PM NOW 200 MCQ  . ONE TOUCH ULTRA TEST test strip TEST SUGARS 2 HOURS AFTER MEALS DAILY  . pantoprazole (PROTONIX) 40 MG tablet Take 1 tablet 30 minutes before breakfast daily  . predniSONE (DELTASONE) 10 MG tablet Take 1 tablet (10 mg total) by mouth daily with breakfast.  . lisinopril (PRINIVIL,ZESTRIL) 5 MG tablet TAKE 1 TABLET (5 MG TOTAL) BY MOUTH DAILY. (Patient not taking: Reported on 01/21/2017)  . oxyCODONE (OXY IR/ROXICODONE) 5 MG immediate release tablet  Take 1-2 tablets (5-10 mg total) by mouth every 6 (six) hours as needed for moderate pain or severe pain. (Patient not taking: Reported on 02/25/2017)   No facility-administered encounter medications on file as of 02/25/2017.      Review of Systems  Constitutional:   No  weight loss, night sweats,  Fevers, chills, fatigue, or  lassitude.  HEENT:   No headaches,  Difficulty swallowing,  Tooth/dental problems, or  Sore throat,                No sneezing, itching, ear ache, nasal congestion, post nasal drip,   CV:  No chest pain,  Orthopnea, PND, swelling in lower extremities, anasarca, dizziness, palpitations, syncope.   GI  No heartburn, indigestion, abdominal pain, nausea, vomiting, diarrhea, change in bowel habits, loss of appetite, bloody stools.   Resp: No shortness of breath with exertion or at rest.  No excess mucus, no productive cough,  No non-productive cough,  No coughing up of blood.  No change in color of mucus.  No wheezing.  No chest wall deformity  Skin: no rash or lesions.  GU: no dysuria, change in color of urine, no urgency or frequency.  No flank pain, no hematuria   MS:  No joint pain or swelling.  No decreased range of motion.  No back pain.    Physical Exam  BP (!) 142/78 (BP Location: Right Arm, Cuff Size: Normal)   Pulse 86   Ht 5\' 9"  (1.753 m)   Wt 209 lb (94.8 kg)   SpO2 99%   BMI 30.86 kg/m   GEN: A/Ox3; pleasant , NAD, well nourished    HEENT:  [place]/AT,  EACs-clear, TMs-wnl, NOSE-clear, THROAT-clear, no lesions, no postnasal drip or exudate noted.   NECK:  Supple w/ fair ROM; no JVD; normal carotid impulses w/o bruits; no thyromegaly or nodules palpated; no lymphadenopathy.    RESP  Clear  P & A; w/o, wheezes/ rales/ or rhonchi. no accessory muscle use, no dullness to percussion  CARD:  RRR, no m/r/g, no peripheral edema, pulses intact, no cyanosis or clubbing.  GI:   Soft & nt; nml bowel sounds; no organomegaly or masses detected.   Musco: Warm  bil, no deformities or joint swelling noted.   Neuro: alert, no focal deficits noted.    Skin: Warm, no lesions or rashes    Lab Results:  CBC    Component Value Date/Time   WBC 8.2 01/10/2017 1534   RBC 4.87 01/10/2017 1534   HGB 14.2 01/10/2017 1534   HCT 41.7 01/10/2017 1534   PLT 314 01/10/2017 1534   MCV 85.6 01/10/2017 1534   MCH 29.2 01/10/2017 1534   MCHC 34.1 01/10/2017 1534   RDW 13.2 01/10/2017 1534   LYMPHSABS 1,620 12/13/2016 0823   MONOABS 891 12/13/2016 0823   EOSABS 567 (H) 12/13/2016 0823   BASOSABS 0 12/13/2016 0823    BMET    Component Value Date/Time  NA 137 01/10/2017 1534   K 3.9 01/10/2017 1534   CL 104 01/10/2017 1534   CO2 26 01/10/2017 1534   GLUCOSE 114 (H) 01/10/2017 1534   BUN 19 01/10/2017 1534   CREATININE 1.27 (H) 01/10/2017 1534   CREATININE 1.18 12/13/2016 0823   CALCIUM 9.6 01/10/2017 1534   GFRNONAA >60 01/10/2017 1534   GFRAA >60 01/10/2017 1534    BNP No results found for: BNP  ProBNP No results found for: PROBNP  Imaging: Dg Chest 2 View  Result Date: 02/25/2017 CLINICAL DATA:  Cough EXAM: CHEST  2 VIEW COMPARISON:  January 10, 2017 and December 13, 2016 chest radiograph; chest CT December 14, 2016 FINDINGS: There is persistent focal consolidation in the lateral right base, stable. Lungs elsewhere clear. Heart size and pulmonary vascularity are normal. The mediastinal adenopathy seen by CT is present but less pronounced by radiography. No progression of adenopathy is evident by radiography. No bone lesions are evident. IMPRESSION: Persistent opacity right base. Given the persistence of this finding without change for nearly 3 months, suspect scarring in this area, although persistent pneumonia in this area is a differential consideration. No new opacity. Stable cardiac silhouette. There is felt to be adenopathy, best seen in the hilar regions, much better appreciated on prior CT. No progression of adenopathy apparent by  radiography. Electronically Signed   By: Lowella Grip III M.D.   On: 02/25/2017 09:02     Assessment & Plan:   Sarcoidosis Appears stable on steroids . Will try to decrease to 10mg  as pt is Type 1 DM .  PFT is stable  Yearly eye exam  Plan  Patient Instructions  Decrease Prednisone 15 mg daily for 2 weeks then 10mg  daily . Hold at this dose.  Delsym 2 tsp Twice daily  .As needed  Cough.  Follow up with Dr. Elsworth Soho  In 4 weeks with chest xray and As needed   Please contact office for sooner follow up if symptoms do not improve or worsen or seek emergency care  Pneumovax today .     Abnormal chest x-ray CXR shows persistent area in R base may be sarcoid changes /scarring  Recheck in 4 weeks , can decide if needs further evaluation or repeat CT chest .       Rexene Edison, NP 02/25/2017

## 2017-02-25 NOTE — Assessment & Plan Note (Signed)
Appears stable on steroids . Will try to decrease to 10mg  as pt is Type 1 DM .  PFT is stable  Yearly eye exam  Plan  Patient Instructions  Decrease Prednisone 15 mg daily for 2 weeks then 10mg  daily . Hold at this dose.  Delsym 2 tsp Twice daily  .As needed  Cough.  Follow up with Dr. Elsworth Soho  In 4 weeks with chest xray and As needed   Please contact office for sooner follow up if symptoms do not improve or worsen or seek emergency care  Pneumovax today .

## 2017-02-25 NOTE — Addendum Note (Signed)
Addended by: Parke Poisson E on: 02/25/2017 02:48 PM   Modules accepted: Orders

## 2017-02-25 NOTE — Assessment & Plan Note (Signed)
CXR shows persistent area in R base may be sarcoid changes /scarring  Recheck in 4 weeks , can decide if needs further evaluation or repeat CT chest .

## 2017-02-25 NOTE — Progress Notes (Signed)
PFT done today. 

## 2017-02-26 LAB — ACID FAST CULTURE WITH REFLEXED SENSITIVITIES (MYCOBACTERIA): Acid Fast Culture: NEGATIVE

## 2017-03-04 NOTE — Progress Notes (Signed)
Reviewed & agree with plan  

## 2017-03-06 ENCOUNTER — Encounter: Payer: Self-pay | Admitting: Pulmonary Disease

## 2017-03-08 MED ORDER — PREDNISONE 10 MG PO TABS: ORAL_TABLET | ORAL | 1 refills | Status: DC

## 2017-03-24 ENCOUNTER — Other Ambulatory Visit: Payer: Self-pay

## 2017-03-24 DIAGNOSIS — R9389 Abnormal findings on diagnostic imaging of other specified body structures: Secondary | ICD-10-CM

## 2017-03-24 DIAGNOSIS — D869 Sarcoidosis, unspecified: Secondary | ICD-10-CM

## 2017-03-25 ENCOUNTER — Ambulatory Visit (INDEPENDENT_AMBULATORY_CARE_PROVIDER_SITE_OTHER): Payer: 59 | Admitting: Pulmonary Disease

## 2017-03-25 ENCOUNTER — Ambulatory Visit (INDEPENDENT_AMBULATORY_CARE_PROVIDER_SITE_OTHER)
Admission: RE | Admit: 2017-03-25 | Discharge: 2017-03-25 | Disposition: A | Discharge status: Home / Self Care | Payer: 59 | Source: Ambulatory Visit | Attending: Pulmonary Disease | Admitting: Pulmonary Disease

## 2017-03-25 ENCOUNTER — Encounter: Payer: Self-pay | Admitting: Pulmonary Disease

## 2017-03-25 ENCOUNTER — Ambulatory Visit (HOSPITAL_BASED_OUTPATIENT_CLINIC_OR_DEPARTMENT_OTHER): Payer: 59 | Attending: Family Medicine | Admitting: Internal Medicine

## 2017-03-25 DIAGNOSIS — E109 Type 1 diabetes mellitus without complications: Secondary | ICD-10-CM | POA: Diagnosis not present

## 2017-03-25 DIAGNOSIS — R0683 Snoring: Secondary | ICD-10-CM | POA: Insufficient documentation

## 2017-03-25 DIAGNOSIS — D869 Sarcoidosis, unspecified: Secondary | ICD-10-CM | POA: Diagnosis not present

## 2017-03-25 DIAGNOSIS — R400 Somnolence: Secondary | ICD-10-CM | POA: Diagnosis not present

## 2017-03-25 DIAGNOSIS — R9389 Abnormal findings on diagnostic imaging of other specified body structures: Secondary | ICD-10-CM | POA: Diagnosis not present

## 2017-03-25 DIAGNOSIS — Z7689 Persons encountering health services in other specified circumstances: Secondary | ICD-10-CM

## 2017-03-25 DIAGNOSIS — G4733 Obstructive sleep apnea (adult) (pediatric): Secondary | ICD-10-CM

## 2017-03-25 MED ORDER — PREDNISONE 5 MG PO TABS: ORAL_TABLET | ORAL | 0 refills | Status: DC

## 2017-03-25 NOTE — Assessment & Plan Note (Signed)
We will keep him on 15 mg of prednisone through December. He has had a good response with this cough  Taper this slowly over the next 3 months -he will call if his cough recurs

## 2017-03-25 NOTE — Patient Instructions (Signed)
Stay on 15 mg of prednisone for December Starting January 1, decreased to 10 mg of prednisone daily. Starting February 1, decreased to 5 mg of prednisone Starting February 15, decrease to 5 mg Monday Wednesday and Friday

## 2017-03-25 NOTE — Progress Notes (Signed)
   Subjective:    Patient ID: Alejandro Griffin, male    DOB: 26-Feb-1981, 36 y.o.   MRN: [id number]  HPI  36 year old type I diabetic, never smoker For follow-up of sarcoidosis  Presented with mediastinal lymphadenopathy  01/13/17 He underwent mediastinoscopy biopsy of 4R lymph node which showed noncaseating granulomas, AFB and fungal stains are negative  He continues to have dry cough, no chest pain or fevers.  He was gradually increased to 20 mg of prednisone and has now cut down to 15 mg.  He is a type I diabetic on insulin pump and his sugars are holding well  Denies skin or eye complaints Chest x-ray from today reviewed which shows improvement of right lower lobe infiltrate  Significant tests/ events reviewed  CT chest  12/14/16 which confirmed mediastinal and bilateral hilar lymphadenopathy and also picked up some lymph nodes in the abdomen.There is an area of consolidation in the right lung base and other areas of groundglass opacification.  ACE level was 18  PFTS FEV1 93%, ratio 87, FVC 86%.  DLCO 98%.      Review of Systems Patient denies significant dyspnea,cough, hemoptysis,  chest pain, palpitations, pedal edema, orthopnea, paroxysmal nocturnal dyspnea, lightheadedness, nausea, vomiting, abdominal or  leg pains      Objective:   Physical Exam   Gen. Pleasant, well-nourished, in no distress ENT - enlarged tonsils, no post nasal drip Neck: No JVD, no thyromegaly, no carotid bruits Lungs: no use of accessory muscles, no dullness to percussion, clear without rales or rhonchi  Cardiovascular: Rhythm regular, heart sounds  normal, no murmurs or gallops, no peripheral edema Musculoskeletal: No deformities, no cyanosis or clubbing         Assessment & Plan:

## 2017-03-25 NOTE — Assessment & Plan Note (Signed)
Sugars are holding up okay on prednisone

## 2017-04-01 DIAGNOSIS — G4733 Obstructive sleep apnea (adult) (pediatric): Secondary | ICD-10-CM

## 2017-04-01 NOTE — Procedures (Signed)
   Patient Name: Alejandro Griffin, Alejandro Griffin Date: 03/25/2017 Gender: Male D.O.B: February 08, 1981 Age (years): 36 Referring Provider: Rita Ohara Height (inches): 74 Interpreting Physician: Baird Lyons MD, ABSM Weight (lbs): 210 RPSGT: Jacolyn Reedy BMI: 32 MRN: [id number] Neck Size: 16.50 CLINICAL INFORMATION Sleep Study Type: HST  Indication for sleep study: Daytime Fatigue, Snoring  Epworth Sleepiness Score: 3  SLEEP STUDY TECHNIQUE A multi-channel overnight portable sleep study was performed. The channels recorded were: nasal airflow, thoracic respiratory movement, and oxygen saturation with a pulse oximetry. Snoring was also monitored.  MEDICATIONS Patient self administered medications include: none reported.  SLEEP ARCHITECTURE Patient was studied for 399.0 minutes. The sleep efficiency was 100.0 % and the patient was supine for 81.1%. The arousal index was 0.0 per hour.  RESPIRATORY PARAMETERS The overall AHI was 20.8 per hour, with a central apnea index of 0.0 per hour.  The oxygen nadir was 81% during sleep.  CARDIAC DATA Mean heart rate during sleep was 82.0 bpm.  IMPRESSIONS - Moderate obstructive sleep apnea occurred during this study (AHI = 20.8/h). - No significant central sleep apnea occurred during this study (CAI = 0.0/h). - Moderate oxygen desaturation was noted during this study (Min O2 = 81%, Mean 92%). - Patient snored .  DIAGNOSIS - Obstructive Sleep Apnea (327.23 [G47.33 ICD-10])  RECOMMENDATIONS - Recommend CPAP titration or autoPAP. Other optioomns - Positional therapy avoiding supine position during sleep. - Avoid alcohol, sedatives and other CNS depressants that may worsen sleep apnea and disrupt normal sleep architecture. - Sleep hygiene should be reviewed to assess factors that may improve sleep quality. - Weight management and regular exercise should be initiated or continued.  [Electronically signed] 04/01/2017 03:36 PM  Baird Lyons  MD, [place], American Board of Sleep Medicine   NPI: [id number]                          [place], [place] [facility]  ELECTRONICALLY SIGNED ON:  04/01/2017, 3:29 PM [place] PH: [phone]   FX: [phone] [place]

## 2017-04-13 ENCOUNTER — Encounter: Payer: Self-pay | Admitting: Family Medicine

## 2017-04-30 ENCOUNTER — Other Ambulatory Visit: Payer: Self-pay | Admitting: Pulmonary Disease

## 2017-05-13 ENCOUNTER — Encounter: Payer: Self-pay | Admitting: Pulmonary Disease

## 2017-05-14 ENCOUNTER — Other Ambulatory Visit: Payer: Self-pay | Admitting: Family Medicine

## 2017-05-16 NOTE — Telephone Encounter (Signed)
Is this okay to refill? Last lipid panel looks like 12/2015 and CPE scheduled for 12/2017.

## 2017-05-16 NOTE — Telephone Encounter (Signed)
Please give him a generic letter stating that he has sarcoidosis and is under treatment.  He is maintained on low-dose prednisone with a plan to taper it over the next few months

## 2017-05-16 NOTE — Telephone Encounter (Signed)
Dr. Elsworth Soho, please see pt email and advise recs, thanks!  I had jury duty on Tuesday and Wednesday and I got held in contempt of court. I need a letter from you about my health and how to treat it if I go to jail. Please advise me of what to do.     Thanks     Alejandro Griffin

## 2017-05-16 NOTE — Telephone Encounter (Signed)
If Dr. Buddy Duty has checked his lipids, we need copy of report.  If Dr. Buddy Duty has NOT checked his lipids, then needs labs.  I haven't gotten any recent notes from Dr. Elberta Leatherwood sure if we need to request from him  (also, last filled 10/2016 for #90--has he been getting filled elsewhere? Has he been taking, or run out??)

## 2017-05-18 NOTE — Telephone Encounter (Signed)
Spoke with patient he said he is taking meds and had another refill after July 2018-he is going to check bottle when he gets home and let me know if Dr Redmond School filled or Dr.Kerr. He will also check with Dr. Buddy Duty and see if he had lipids done, if not I told him he will need to do here.

## 2017-05-18 NOTE — Telephone Encounter (Signed)
Letter has been placed up front in the brown accordion folder for patient pick-up.

## 2017-05-19 ENCOUNTER — Encounter: Payer: Self-pay | Admitting: Family Medicine

## 2017-05-25 ENCOUNTER — Ambulatory Visit: Payer: Self-pay | Admitting: Family Medicine

## 2017-06-01 ENCOUNTER — Encounter: Payer: Self-pay | Admitting: Pulmonary Disease

## 2017-06-01 NOTE — Progress Notes (Signed)
Chief Complaint  Patient presents with  . Hyperlipidemia    fasting med check. No concerns.     37 year old male with sarcoidosis, type 1 diabetes, hypothyroidism and h/o vitamin D deficiency presents for f/u.  He has hyperlipidemia and is on atorvastatin.  His last CPE was 12/2016. He was requesting refill of atorvastatin recently, and is past due for lipids.  It does not appear that Dr. Buddy Duty checked his cholesterol at recent visit. I believe last lipids were checked in 10/2016 at Dr. Buddy Duty (see below).  LDL was >100; TSH also not at goal.   He last saw Dr. Buddy Duty in January.  TSH then was 35.60 and A1c was 10.5 (pt is pulling results up on his phone).TSH in November was 10.75. Thyroid dose was changed from 200 to 250 (2 of the 125's), but there had been an issue with his rx--he was only taking ONE of the 112's due to pharmacy/prescription error.  He states everything got fixed when he saw him again in January.  Since January visit he has been taking 250 (2 of the 125's) daily. Insulin pump has also been adjusted.   Admits to missing his cholesterol medication  Up to 2x/week Takes same time as protonix, around 9am (takes thyroid at 5am).  11/02/16 c-met normal except glucose 114, A1c 8.9;  Total chol 179, HDL 43; Chol/HDL 4.2, LDL 107 ,TG 148, TSH 12.27   Sarcoidosis--under the care of Dr. Elsworth Soho. He is on low dose prednisone, and doing well. Slight cough only.  He was recently found to be in contempt of court, while on jury duty (read about case online), very stressful last week.  All resolved now/  H/o vitamin D deficiency:  Last level 27 in 12/2016 He is currently taking same vitamins.  Didn't empty his pockets for weight today.  BP's are fine at Dr. Cindra Eves per pt He said Dr. Elsworth Soho said not to restart lisinopril  PMH, PSH, [place] reviewed  Outpatient Encounter Medications as of 06/02/2017  Medication Sig Note  . atorvastatin (LIPITOR) 20 MG tablet TAKE 1 TABLET (20 MG TOTAL) BY MOUTH DAILY.    Marland Kitchen Continuous Blood Gluc Sensor (FREESTYLE LIBRE SENSOR SYSTEM) MISC APPLY ONE SENSOR TO BACK OF UPPER ARM EVERY 10 DAYS   . Insulin Disposable Pump (OMNIPOD) MISC by Does not apply route.   Marland Kitchen levothyroxine (SYNTHROID, LEVOTHROID) 125 MCG tablet Take 250 mcg by mouth daily before breakfast.   . pantoprazole (PROTONIX) 40 MG tablet Take 1 tablet 30 minutes before breakfast daily   . predniSONE (DELTASONE) 5 MG tablet TAKE AS DIRECTED   . [DISCONTINUED] atorvastatin (LIPITOR) 20 MG tablet TAKE 1 TABLET (20 MG TOTAL) BY MOUTH DAILY.   . [DISCONTINUED] dapagliflozin propanediol (FARXIGA) 10 MG TABS tablet TAKE 1 TABLET BY MOUTH EVERY DAY   . acetaminophen (TYLENOL) 500 MG tablet Take 1,000 mg by mouth every 6 (six) hours as needed for moderate pain. 11/17/2016: Using for headaches with coughing  . albuterol (PROVENTIL HFA;VENTOLIN HFA) 108 (90 Base) MCG/ACT inhaler Inhale 2 puffs into the lungs every 6 (six) hours as needed for wheezing or shortness of breath. (Patient not taking: Reported on 06/02/2017)   . HYDROcodone-homatropine (HYCODAN) 5-1.5 MG/5ML syrup Take 5 mLs by mouth every 8 (eight) hours as needed for cough. (Patient not taking: Reported on 06/02/2017)   . [DISCONTINUED] levothyroxine (SYNTHROID, LEVOTHROID) 200 MCG tablet TAKE 1 TABLET (265mg) BY MOUTH EVERY DAY IN THE MORNING ON EMPTY STOMACH..Marland Kitchen:49 PM NOW 200 MCQ   . [  DISCONTINUED] oxyCODONE (OXY IR/ROXICODONE) 5 MG immediate release tablet Take 1-2 tablets (5-10 mg total) by mouth every 6 (six) hours as needed for moderate pain or severe pain.   . [DISCONTINUED] predniSONE (DELTASONE) 10 MG tablet Take 15 mg daily for 2 weeks then 71m daily and stay at this dose    No facility-administered encounter medications on file as of 06/02/2017.    No Known Allergies  ROS: no fever, chills URI symptoms, vision changes.  Slight residual cough (overall much better), no DOE or shortness of breath. No chest pain, palpitations. No GI or GU complaints.   Moods are good, stress last week, resolved. No bleeding, bruising, rash.  See HPI   PHYSICAL EXAM:  BP (!) 140/100   Pulse 88   Ht _0  (1.753 m)   Wt 221 lb 3.2 oz (100.3 kg)   BMI 32.67 kg/m    Wt Readings from Last 3 Encounters:  06/02/17 221 lb 3.2 oz (100.3 kg)  03/25/17 210 lb (95.3 kg)  02/25/17 209 lb (94.8 kg)    132/98 on repeat by MD  Well appearing, pleasant male, in good spirits, with rare dry cough. HEENT: PERRL, EOMI, conjunctiva and sclera clear. OP clear Neck: no lymphadenopathy, thyromegaly or mass Heart: regular rate and rhythm Lungs: clear bilaterally, no wheezes, rales, ronchi Abdomen: nontender, no organomegaly or mass Extremities: no clubbing, cyanosis or edema Skin: normal turgor, no rash Psych: normal mood, affect, hygiene and grooming Neuro: alert and oriented, cranial nerves intact, normal gait   ASSESSMENT/PLAN:  Hyperlipidemia associated with type 2 diabetes mellitus (HCC) - lowfat, low cholesterol diet, continue statin. Recheck when TSH better (lab slip given, or get results from Dr. KBuddy Dutyif he checks before his CPE here) - Plan: Lipid panel  Type 1 diabetes mellitus without complications ([place] - poorly controlled, under care of Dr. KBuddy Duty  Prednisone is being tapered. pump was adjusted recently  Other specified hypothyroidism - elevated TSH recently, dose corrected/adjusted. Monitored by Dr. KBuddy Duty Vitamin D deficiency - recommended adding 1000 IU Vit D3 in addition to his current vitamins. Recheck at physical  Sarcoidosis - on prednisone taper. Followed by Dr. AElsworth Soho Improved    Lipids are NOT being checked today given that his thyroid was so underactive on last check just a few weeks ago. Would prefer to wait until this is more corrected.   Continue the 267mof atorvastatin daily.  Try hard to remember it every day. Continue to try and follow a lowfat, low cholesterol diet.  I'm giving you paperwork to get your lipids rechecked once  your thyroid is closer to normal (TSH <8, ideally "normal"). If Dr. KeBuddy Dutyhecks it through his office, you don't need to go to the lab, just get those results sent to me. [facility] [address] [place]12 on Saturday mornings).    Please take an additional 1000 IU of Vitamin D3 daily.  The vitamins that you were taking at your physical in September, which are reportedly the same as what you are taking now, didn't quite have enough in them (your level was a little low). We will plan on rechecking the vitamin D with your next physical.  Your blood pressure was up a little today.  Try and keep an eye on this.  If it remains elevated at your next visit in a few weeks with Dr. AlElsworth Sohobe sure to ask if okay to start a medication such as losartan (ARB) vs  restarting lisinopril.

## 2017-06-02 ENCOUNTER — Ambulatory Visit: Payer: 59 | Admitting: Family Medicine

## 2017-06-02 ENCOUNTER — Encounter: Payer: Self-pay | Admitting: Family Medicine

## 2017-06-02 VITALS — BP 132/98 | HR 88 | Ht 69.0 in | Wt 221.2 lb

## 2017-06-02 DIAGNOSIS — E109 Type 1 diabetes mellitus without complications: Secondary | ICD-10-CM | POA: Diagnosis not present

## 2017-06-02 DIAGNOSIS — E785 Hyperlipidemia, unspecified: Secondary | ICD-10-CM

## 2017-06-02 DIAGNOSIS — E038 Other specified hypothyroidism: Secondary | ICD-10-CM | POA: Diagnosis not present

## 2017-06-02 DIAGNOSIS — D869 Sarcoidosis, unspecified: Secondary | ICD-10-CM

## 2017-06-02 DIAGNOSIS — E1169 Type 2 diabetes mellitus with other specified complication: Secondary | ICD-10-CM

## 2017-06-02 DIAGNOSIS — E559 Vitamin D deficiency, unspecified: Secondary | ICD-10-CM | POA: Diagnosis not present

## 2017-06-02 MED ORDER — ATORVASTATIN CALCIUM 20 MG PO TABS: ORAL_TABLET | ORAL | 0 refills | Status: DC

## 2017-06-02 NOTE — Patient Instructions (Addendum)
  Lipids are NOT being checked today given that his thyroid was so underactive on last check just a few weeks ago. Would prefer to wait until this is more corrected. Continue the 20mg  of atorvastatin daily.  Try hard to remember it every day. Continue to try and follow a lowfat, low cholesterol diet.  I'm giving you paperwork to get your lipids rechecked once your thyroid is closer to normal (TSH <8, ideally "normal"). If Dr. Buddy Duty checks it through his office, you don't need to go to the lab, just get those results sent to me. The [facility] on [address] (in [place]) is open 8-12 on Saturday mornings).   Please take an additional 1000 IU of Vitamin D3 daily.  The vitamins that you were taking at your physical in September, which are reportedly the same as what you are taking now, didn't quite have enough in them (your level was a little low). We will plan on rechecking the vitamin D with your next physical.  Your blood pressure was up a little today.  Try and keep an eye on this.  If it remains elevated at your next visit in a few weeks with Dr. Elsworth Soho, be sure to ask if okay to start a medication such as losartan (ARB) vs restarting lisinopril.

## 2017-06-24 ENCOUNTER — Ambulatory Visit: Payer: 59 | Admitting: Pulmonary Disease

## 2017-06-24 ENCOUNTER — Encounter: Payer: Self-pay | Admitting: Pulmonary Disease

## 2017-06-24 DIAGNOSIS — D869 Sarcoidosis, unspecified: Secondary | ICD-10-CM | POA: Diagnosis not present

## 2017-06-24 NOTE — Progress Notes (Signed)
   Subjective:    Patient ID: Alejandro Griffin, male    DOB: April 02, 1981, 37 y.o.   MRN: [id number]  HPI  37 y otype I diabetic, never smokerFor follow-up of sarcoidosis  Presented with mediastinal lymphadenopathy  01/13/17 He underwent mediastinoscopy biopsy of 4R lymph node which showed noncaseating granulomas, AFB and fungal stains are negative He was treated with prednisone for 4-5 months and gradually tapered to off, he completely stopped taking prednisone from 1 March. Cough has gone away, denies skin or joint complaints Chest x-ray 03/2017 reviewed which shows improvement of right lower lobe infiltrate  Significant tests/ events reviewed  CT chest 8/28/18which confirmed mediastinal and bilateral hilar lymphadenopathy and also picked up some lymph nodes in the abdomen.There is an area of consolidation in the right lung base and other areas of groundglass opacification.  ACE level was 18  PFTS FEV1 93%, ratio 87, FVC 86%. DLCO 98%.   Review of Systems Patient denies significant dyspnea,cough, hemoptysis,  chest pain, palpitations, pedal edema, orthopnea, paroxysmal nocturnal dyspnea, lightheadedness, nausea, vomiting, abdominal or  leg pains      Objective:   Physical Exam Gen. Pleasant, well-nourished, in no distress ENT - no thrush, no post nasal drip Neck: No JVD, no thyromegaly, no carotid bruits Lungs: no use of accessory muscles, no dullness to percussion, clear without rales or rhonchi  Cardiovascular: Rhythm regular, heart sounds  normal, no murmurs or gallops, no peripheral edema Musculoskeletal: No deformities, no cyanosis or clubbing         Assessment & Plan:

## 2017-06-24 NOTE — Patient Instructions (Signed)
Sarcoidosis appears improved. Chest x-ray on next visit

## 2017-06-24 NOTE — Assessment & Plan Note (Signed)
Appears improved overall cough is gone right lower lobe infiltrate is resolved. Okay to stay off prednisone. Repeat chest x-ray in 6 months. He will get his annual eye exam and make sure that they do a slit-lamp evaluation for sarcoidosis

## 2017-07-14 ENCOUNTER — Encounter: Payer: Self-pay | Admitting: Family Medicine

## 2017-07-14 ENCOUNTER — Ambulatory Visit: Payer: 59 | Admitting: Family Medicine

## 2017-07-14 VITALS — BP 132/88 | HR 96 | Temp 98.7°F | Ht 67.0 in | Wt 224.2 lb

## 2017-07-14 DIAGNOSIS — E109 Type 1 diabetes mellitus without complications: Secondary | ICD-10-CM

## 2017-07-14 DIAGNOSIS — D869 Sarcoidosis, unspecified: Secondary | ICD-10-CM | POA: Diagnosis not present

## 2017-07-14 DIAGNOSIS — J0100 Acute maxillary sinusitis, unspecified: Secondary | ICD-10-CM | POA: Diagnosis not present

## 2017-07-14 MED ORDER — AMOXICILLIN-POT CLAVULANATE 875-125 MG PO TABS: 1.0000 | ORAL_TABLET | Freq: Two times a day (BID) | ORAL | 0 refills | Status: DC

## 2017-07-14 NOTE — Patient Instructions (Signed)
  Drink plenty of water. Take the antibiotic twice daily with food. Use probiotics if you get diarrhea. Contact us if your symptoms don't completely resolve, and sooner if you are getting worse. Continue mucinex. Consider sinus rinses. Tylenol or ibuprofen as needed for pain or fever

## 2017-07-14 NOTE — Progress Notes (Signed)
Chief Complaint  Patient presents with  . Cough    st and sinus pain and pressure. Lots of nasal mucus that is green and dark brown in color. Cough seems to dry to him. No fevers, chills or body aches. Symptoms started last Tues and right ear pain started today.     7-8 days ago he started with nasal congestion, discolored mucus, green phlegm.  The other day he noted brown spots in the phlegm. Today his right ear started hurting.  He has also had sore throat, mainly right-sided.  Nasal mucus remains green, as is the phlegm. Denies chest pain or shortness of breath. He has been off prednisone since early March. Sugar was 275 this morning, which is higher than normal for him.  Usually running only up to 160.  No sick contact prior to becoming ill Taking mucinex.  PMH, PSH, SH reviewed  Outpatient Encounter Medications as of 07/14/2017  Medication Sig Note  . atorvastatin (LIPITOR) 20 MG tablet TAKE 1 TABLET (20 MG TOTAL) BY MOUTH DAILY.   Marland Kitchen Continuous Blood Gluc Sensor (FREESTYLE LIBRE SENSOR SYSTEM) MISC APPLY ONE SENSOR TO BACK OF UPPER ARM EVERY 10 DAYS   . guaiFENesin (MUCINEX) 600 MG 12 hr tablet Take 600 mg by mouth 2 (two) times daily.   . Insulin Disposable Pump (OMNIPOD) MISC by Does not apply route.   Marland Kitchen levothyroxine (SYNTHROID, LEVOTHROID) 125 MCG tablet Take 250 mcg by mouth daily before breakfast.   . pantoprazole (PROTONIX) 40 MG tablet Take 1 tablet 30 minutes before breakfast daily   . acetaminophen (TYLENOL) 500 MG tablet Take 1,000 mg by mouth every 6 (six) hours as needed for moderate pain. 11/17/2016: Using for headaches with coughing  . albuterol (PROVENTIL HFA;VENTOLIN HFA) 108 (90 Base) MCG/ACT inhaler Inhale 2 puffs into the lungs every 6 (six) hours as needed for wheezing or shortness of breath. (Patient not taking: Reported on 07/14/2017)   . HYDROcodone-homatropine (HYCODAN) 5-1.5 MG/5ML syrup Take 5 mLs by mouth every 8 (eight) hours as needed for cough. (Patient not  taking: Reported on 07/14/2017)   . [DISCONTINUED] LEVOXYL 112 MCG tablet     No facility-administered encounter medications on file as of 07/14/2017.    No Known Allergies  ROS: No fever, chills.  No nausea, vomiting, diarrhea, rashes, bleeding, bruising. No chest pain, shortness of breath.    PHYSICAL EXAM:  BP 132/88   Pulse 96   Temp 98.7 F (37.1 C) (Tympanic)   Ht 5\' 7"  (1.702 m)   Wt 224 lb 3.2 oz (101.7 kg)   BMI 35.11 kg/m   Well appearing male in no distress HEENT: PERRL, EOMI, conjunctiva and sclera are clear.  Nasal mucosa is mod edematous, with yellow-green purulence noted bilaterally. TM's--with some scarring and white, minimal effusion, no erythema or bulging. EAC's normal.  Tender at bilateral maxillary sinuses.  OP is clear except for some erythema posteriorly/cobblestoning Neck: no lymphadenopathy or mass Heart: regular rate and rhythm no murmur Lungs: clear bilaterally, no wheezes, rales, ronchi Skin: normal turgor, no rash Psych: normal mood, affect, hygiene and grooming Neuro: alert and oriented, cranial nerves intact, normal gait   ASSESSMENT/PLAN:  Acute non-recurrent maxillary sinusitis - Plan: amoxicillin-clavulanate (AUGMENTIN) 875-125 MG tablet  Type 1 diabetes mellitus without complications ([facility])  Sarcoidosis - stable/improved, recently off prednisone    Drink plenty of water. Take the antibiotic twice daily with food. Use probiotics if you get diarrhea. Contact us if your symptoms don't completely resolve, and sooner  if you are getting worse. Continue mucinex. Consider sinus rinses. Tylenol or ibuprofen as needed for pain or fever

## 2017-07-18 ENCOUNTER — Encounter: Payer: Self-pay | Admitting: Family Medicine

## 2017-07-18 ENCOUNTER — Ambulatory Visit: Payer: 59 | Admitting: Family Medicine

## 2017-07-18 VITALS — BP 100/90 | HR 112 | Temp 99.5°F | Ht 67.0 in | Wt 210.8 lb

## 2017-07-18 DIAGNOSIS — J0100 Acute maxillary sinusitis, unspecified: Secondary | ICD-10-CM | POA: Diagnosis not present

## 2017-07-18 DIAGNOSIS — R112 Nausea with vomiting, unspecified: Secondary | ICD-10-CM | POA: Diagnosis not present

## 2017-07-18 DIAGNOSIS — R509 Fever, unspecified: Secondary | ICD-10-CM

## 2017-07-18 DIAGNOSIS — E109 Type 1 diabetes mellitus without complications: Secondary | ICD-10-CM

## 2017-07-18 LAB — COMPREHENSIVE METABOLIC PANEL
ALT: 17 IU/L (ref 0–44)
AST: 11 IU/L (ref 0–40)
Albumin/Globulin Ratio: 1.3 (ref 1.2–2.2)
Albumin: 4.1 g/dL (ref 3.5–5.5)
Alkaline Phosphatase: 89 IU/L (ref 39–117)
BUN/Creatinine Ratio: 18 (ref 9–20)
BUN: 18 mg/dL (ref 6–20)
Bilirubin Total: 0.5 mg/dL (ref 0.0–1.2)
CO2: 25 mmol/L (ref 20–29)
Calcium: 9.5 mg/dL (ref 8.7–10.2)
Chloride: 100 mmol/L (ref 96–106)
Creatinine, Ser: 0.99 mg/dL (ref 0.76–1.27)
GFR calc Af Amer: 111 mL/min/{1.73_m2} (ref >59)
GFR calc non Af Amer: 96 mL/min/{1.73_m2} (ref >59)
Globulin, Total: 3.1 g/dL (ref 1.5–4.5)
Glucose: 347 mg/dL — ABNORMAL HIGH (ref 65–99)
Potassium: 4.5 mmol/L (ref 3.5–5.2)
Sodium: 136 mmol/L (ref 134–144)
Total Protein: 7.2 g/dL (ref 6.0–8.5)

## 2017-07-18 LAB — CBC WITH DIFFERENTIAL/PLATELET
Basophils Absolute: 0 10*3/uL (ref 0.0–0.2)
Basos: 0 %
EOS (ABSOLUTE): 0 10*3/uL (ref 0.0–0.4)
Eos: 0 %
Hematocrit: 45.4 % (ref 37.5–51.0)
Hemoglobin: 15.2 g/dL (ref 13.0–17.7)
Lymphocytes Absolute: 0.5 10*3/uL — ABNORMAL LOW (ref 0.7–3.1)
Lymphs: 4 %
MCH: 29.1 pg (ref 26.6–33.0)
MCHC: 33.5 g/dL (ref 31.5–35.7)
MCV: 87 fL (ref 79–97)
Monocytes Absolute: 0.5 10*3/uL (ref 0.1–0.9)
Monocytes: 5 %
Neutrophils Absolute: 10.6 10*3/uL — ABNORMAL HIGH (ref 1.4–7.0)
Neutrophils: 91 %
Platelets: 372 10*3/uL (ref 150–379)
RBC: 5.22 x10E6/uL (ref 4.14–5.80)
RDW: 13.9 % (ref 12.3–15.4)
WBC: 11.7 10*3/uL — ABNORMAL HIGH (ref 3.4–10.8)

## 2017-07-18 MED ORDER — PROMETHAZINE HCL 50 MG/ML IJ SOLN
25.0000 mg | Freq: Once | INTRAMUSCULAR | Status: AC
  Administered 2017-07-18: 25 mg via INTRAMUSCULAR

## 2017-07-18 MED ORDER — ONDANSETRON 4 MG PO TBDP: 4.0000 mg | ORAL_TABLET | Freq: Three times a day (TID) | ORAL | 0 refills | Status: DC | PRN

## 2017-07-18 NOTE — Patient Instructions (Addendum)
Try and stay well hydrated (by taking small sips, sugar-free ice pop).  Wait for the nausea medication to get on board before really trying to drink or eat. We are checking some bloodwork. If it seems like you are getting dehydrated (urine is very dark, or not producing much, getting light-headed or dizzy with standing/moving), mouth is very dry, feeling very weak, lethargic, then it is important to go to the ER, where they may need to give you IV fluids (and do additional evaluation, labs).  Once your nausea has improved, try and take your regular oral medications.  You were given 25mg  of phenergan for nausea today, which will make you sleepy.  I sent a prescription for the zofran tablets that we discussed to your pharmacy.

## 2017-07-18 NOTE — Progress Notes (Signed)
Chief Complaint  Patient presents with  . Emesis    started 3:00 this am. Last time was 2hrs ago. No fevers.    He was seen 3/28 and diagnosed with maxillary sinusitis, started on Augmentin.  Mucus had been thick and green. He had also been having right sided ear and throat pain. He reports that his sinus infection had been improving--mucus is getting thinner, no further ear pain, sinus pressure resolved.  He woke up at 3am this morning and started vomiting.  Vomited about 8x between 3am and 10:30am.  Clear fluids and some food at first, that later changed to yellow fluid that does NOT look like mucus.  +ongoing nausea, and has some epigastric pain since vomiting.  No hematemesis or coffee ground emesis.  He reports normal bowels, no diarrhea. Throat is a little sore from vomiting. No known fevers or chills.  Sugar was 287 before coming to the office, 218 earlier today.  No known sick contacts.  PMH, [place] SH reviewed  Outpatient Encounter Medications as of 07/18/2017  Medication Sig Note  . amoxicillin-clavulanate (AUGMENTIN) 875-125 MG tablet Take 1 tablet by mouth 2 (two) times daily.   Marland Kitchen atorvastatin (LIPITOR) 20 MG tablet TAKE 1 TABLET (20 MG TOTAL) BY MOUTH DAILY.   Marland Kitchen Continuous Blood Gluc Sensor (FREESTYLE LIBRE SENSOR SYSTEM) MISC APPLY ONE SENSOR TO BACK OF UPPER ARM EVERY 10 DAYS   . Insulin Disposable Pump (OMNIPOD) MISC by Does not apply route.   Marland Kitchen levothyroxine (SYNTHROID, LEVOTHROID) 125 MCG tablet Take 250 mcg by mouth daily before breakfast.   . pantoprazole (PROTONIX) 40 MG tablet Take 1 tablet 30 minutes before breakfast daily   . acetaminophen (TYLENOL) 500 MG tablet Take 1,000 mg by mouth every 6 (six) hours as needed for moderate pain. 11/17/2016: Using for headaches with coughing  . albuterol (PROVENTIL HFA;VENTOLIN HFA) 108 (90 Base) MCG/ACT inhaler Inhale 2 puffs into the lungs every 6 (six) hours as needed for wheezing or shortness of breath. (Patient not taking: Reported  on 07/18/2017)   . guaiFENesin (MUCINEX) 600 MG 12 hr tablet Take 600 mg by mouth 2 (two) times daily.   Marland Kitchen HYDROcodone-homatropine (HYCODAN) 5-1.5 MG/5ML syrup Take 5 mLs by mouth every 8 (eight) hours as needed for cough. (Patient not taking: Reported on 07/14/2017)    No facility-administered encounter medications on file as of 07/18/2017.    No Known Allergies  ROS:  No known fever. +vomiting and abdominal pain--feels sore from vomiting.  No diarrhea. No bleeding, bruising, rash. No syncope, chest pain, shortness of breath.  Minimal cough.  Sinus symptoms are improving.   PHYSICAL EXAM:  BP 120/90   Pulse (!) 104   Temp 99.5 F (37.5 C) (Tympanic)   Ht 5\' 7"  (1.702 m)   Wt 210 lb 12.8 oz (95.6 kg)   BMI 33.02 kg/m   Lying 130/100 P 104 Sitting 120/90 P98 Standing 100/90 P 112  Mildly ill-appearing male (appears nauseated, otherwise in no distress) HEENT: conjunctiva and sclera are clear, anicteric.  OP is clear, no erythema, moist mucus membranes.  Sinuses nontender Neck: no lymphadenopathy or mass Heart: tachycardic, no murmur Lungs: clear bilaterally. Abdomen: mildly tender in epigastrium, and mild diffuse lower abdominal tenderness. No rebound or guarding, no masses. No organomegaly. Normal bowel sounds. Extremities: no edema Skin: normal turgor, no rash Psych: normal mood, affect, grooming Neuro: alert and oriented, cranial nerves intact. Strength grossly normal   ASSESSMENT/PLAN:  Nausea and vomiting, intractability of vomiting not specified,  unspecified vomiting type - at risk for dehydration, DKA. Check labs. Treat with phenergan injection in office, zofran ODT if needed at home. s/sx dehydration reviewed, to ER if worse - Plan: CBC with Differential/Platelet, Comprehensive metabolic panel, promethazine (PHENERGAN) injection 25 mg  Fever, unspecified fever cause - Plan: CBC with Differential/Platelet  Type 1 diabetes mellitus without complications ([place]) - Plan:  Comprehensive metabolic panel  Acute non-recurrent maxillary sinusitis - improving, complete course of antibiotics rx'd last week   Stat CBC, chem Orthostatic vitals Phenergan injection 25mg  IM

## 2017-07-26 ENCOUNTER — Other Ambulatory Visit: Payer: Self-pay | Admitting: Family Medicine

## 2017-08-18 ENCOUNTER — Encounter: Payer: 59 | Admitting: Family Medicine

## 2017-08-18 ENCOUNTER — Encounter: Payer: Self-pay | Admitting: Pulmonary Disease

## 2017-09-01 ENCOUNTER — Encounter: Payer: Self-pay | Admitting: Family Medicine

## 2017-10-15 ENCOUNTER — Emergency Department (HOSPITAL_COMMUNITY): Payer: 59

## 2017-10-15 ENCOUNTER — Ambulatory Visit (HOSPITAL_COMMUNITY)
Admission: EM | Admit: 2017-10-15 | Discharge: 2017-10-15 | Disposition: A | Discharge status: Home / Self Care | Payer: 59 | Source: Home / Self Care

## 2017-10-15 ENCOUNTER — Other Ambulatory Visit: Payer: Self-pay

## 2017-10-15 ENCOUNTER — Encounter (HOSPITAL_COMMUNITY): Payer: Self-pay | Admitting: *Deleted

## 2017-10-15 ENCOUNTER — Emergency Department (HOSPITAL_COMMUNITY)
Admission: EM | Admit: 2017-10-15 | Discharge: 2017-10-15 | Disposition: A | Discharge status: Home / Self Care | Payer: 59 | Attending: Emergency Medicine | Admitting: Emergency Medicine

## 2017-10-15 DIAGNOSIS — Z79899 Other long term (current) drug therapy: Secondary | ICD-10-CM | POA: Diagnosis not present

## 2017-10-15 DIAGNOSIS — Z794 Long term (current) use of insulin: Secondary | ICD-10-CM | POA: Insufficient documentation

## 2017-10-15 DIAGNOSIS — E039 Hypothyroidism, unspecified: Secondary | ICD-10-CM | POA: Diagnosis not present

## 2017-10-15 DIAGNOSIS — R079 Chest pain, unspecified: Secondary | ICD-10-CM | POA: Diagnosis present

## 2017-10-15 DIAGNOSIS — E119 Type 2 diabetes mellitus without complications: Secondary | ICD-10-CM | POA: Insufficient documentation

## 2017-10-15 DIAGNOSIS — R0789 Other chest pain: Secondary | ICD-10-CM | POA: Insufficient documentation

## 2017-10-15 HISTORY — DX: Sarcoidosis, unspecified: D86.9

## 2017-10-15 LAB — BASIC METABOLIC PANEL
Anion gap: 10 (ref 5–15)
BUN: 19 mg/dL (ref 6–20)
CO2: 24 mmol/L (ref 22–32)
Calcium: 9.5 mg/dL (ref 8.9–10.3)
Chloride: 106 mmol/L (ref 98–111)
Creatinine, Ser: 1.03 mg/dL (ref 0.61–1.24)
GFR calc Af Amer: >60 mL/min (ref >60)
GFR calc non Af Amer: >60 mL/min (ref >60)
Glucose, Bld: 104 mg/dL — ABNORMAL HIGH (ref 70–99)
Potassium: 3.9 mmol/L (ref 3.5–5.1)
Sodium: 140 mmol/L (ref 135–145)

## 2017-10-15 LAB — CBC
HCT: 47.6 % (ref 39.0–52.0)
Hemoglobin: 15.5 g/dL (ref 13.0–17.0)
MCH: 28.4 pg (ref 26.0–34.0)
MCHC: 32.6 g/dL (ref 30.0–36.0)
MCV: 87.3 fL (ref 78.0–100.0)
Platelets: 359 10*3/uL (ref 150–400)
RBC: 5.45 MIL/uL (ref 4.22–5.81)
RDW: 13.4 % (ref 11.5–15.5)
WBC: 12.4 10*3/uL — ABNORMAL HIGH (ref 4.0–10.5)

## 2017-10-15 LAB — I-STAT TROPONIN, ED
Troponin i, poc: 0 ng/mL (ref 0.00–0.08)
Troponin i, poc: 0 ng/mL (ref 0.00–0.08)

## 2017-10-15 LAB — CBG MONITORING, ED: Glucose-Capillary: 91 mg/dL (ref 70–99)

## 2017-10-15 MED ORDER — KETOROLAC TROMETHAMINE 30 MG/ML IJ SOLN
30.0000 mg | Freq: Once | INTRAMUSCULAR | Status: AC
  Administered 2017-10-15: 30 mg via INTRAVENOUS
  Filled 2017-10-15: qty 1

## 2017-10-15 MED ORDER — HYDROCODONE-ACETAMINOPHEN 5-325 MG PO TABS: 1.0000 | ORAL_TABLET | Freq: Four times a day (QID) | ORAL | 0 refills | Status: DC | PRN

## 2017-10-15 MED ORDER — IOPAMIDOL (ISOVUE-370) INJECTION 76%
100.0000 mL | Freq: Once | INTRAVENOUS | Status: AC | PRN
  Administered 2017-10-15: 100 mL via INTRAVENOUS

## 2017-10-15 MED ORDER — SODIUM CHLORIDE 0.9 % IV BOLUS
1000.0000 mL | Freq: Once | INTRAVENOUS | Status: AC
  Administered 2017-10-15: 1000 mL via INTRAVENOUS

## 2017-10-15 MED ORDER — PREDNISONE 20 MG PO TABS: ORAL_TABLET | ORAL | 0 refills | Status: DC

## 2017-10-15 MED ORDER — METHOCARBAMOL 500 MG PO TABS: 1000.0000 mg | ORAL_TABLET | Freq: Three times a day (TID) | ORAL | 0 refills | Status: DC | PRN

## 2017-10-15 MED ORDER — IOPAMIDOL (ISOVUE-370) INJECTION 76%
INTRAVENOUS | Status: AC
  Filled 2017-10-15: qty 100

## 2017-10-15 NOTE — ED Triage Notes (Signed)
Patient to ED from [facility] for further evaluation of central and L sided CP radiating to back beginning at 1530 while sitting. Patient reports pain is intermittent, but has been progressive since onset. Patient states he's never had this before, denies SOB, dizziness, N/V. Patient is type 1 diabetic - diaphoretic in triage. Resp e/u, skin warm/dry.

## 2017-10-15 NOTE — ED Notes (Signed)
Discussed with Dr Gwendlyn Deutscher.  Recommend pt goes to ED.  Pt states his wife will take him.

## 2017-10-15 NOTE — ED Triage Notes (Signed)
C/O sudden onset mid chest pain radiating into left chest and into back since 1530 while pt was sitting at baseball game.  Denies dizziness, nausea, SOB.  Pain worse with movement, improves with deep breath.

## 2017-10-15 NOTE — ED Provider Notes (Signed)
[place] EMERGENCY DEPARTMENT Provider Note   CSN: [id number] Arrival date & time: 10/15/17  1808     History   Chief Complaint Chief Complaint  Patient presents with  . Chest Pain    HPI Alejandro Griffin is a 37 y.o. male.  HPI Patient presents with abrupt onset left-sided chest pain radiating to his left thoracic back starting at 330 this afternoon while sitting watching a baseball game.  Pain is worse with deep breathing.  No previously similar symptoms.  No new lower extremity swelling or pain.  No recent cough, fever or chills.  Patient has been diaphoretic.  No recent extended travel or immobilization.  No family or personal history of coronary artery disease/thrombi embolic disease.  Was evaluated in urgent care referred to the emergency department. Past Medical History:  Diagnosis Date  . Cough   . Diabetes mellitus without complication ([place])   . Dyspnea   . Gastritis 2016   confirmed on bx on EGD. Dr. Hilarie Fredrickson  . GERD (gastroesophageal reflux disease)   . Hyperlipidemia   . Hypothyroidism   . Inguinal hernia    RT  . Low testosterone 2016   better on repeat in 2017  . Obesity   . Sarcoidosis   . Vitamin D deficiency     Patient Active Problem List   Diagnosis Date Noted  . Abnormal chest x-ray 02/25/2017  . Sarcoidosis 12/24/2016  . Type 1 diabetes mellitus without complications ([place]) 11/30/4816  . Maxillary sinusitis 03/26/2016  . Chronic fatigue 03/26/2016  . Daytime somnolence 03/26/2016  . Cephalalgia 11/12/2015  . Sleep concern 11/12/2015  . Snoring 11/12/2015  . Hypertension associated with diabetes ([place]) 04/25/2015  . Hypothyroid 08/26/2014  . Low testosterone 08/26/2014  . Other fatigue 08/26/2014  . Abnormal DTR (deep tendon reflex) 08/26/2014  . Vitamin D deficiency 08/26/2014  . Hyperlipidemia associated with type 2 diabetes mellitus ([place]) 02/28/2014  . Obesity (BMI 30-39.9) 06/27/2013  . Diabetes mellitus, type II,  insulin dependent ([place]) 01/21/2012    Past Surgical History:  Procedure Laterality Date  . HERNIA REPAIR    . INGUINAL HERNIA REPAIR Right 02/20/2015   Procedure: RIGHT INGUINAL HERNIA REPAIR WITH MESH;  Surgeon: Donnie Mesa, MD;  Location: [facility];  Service: General;  Laterality: Right;  . INSERTION OF MESH Right 02/20/2015   Procedure: INSERTION OF MESH;  Surgeon: Donnie Mesa, MD;  Location: [facility];  Service: General;  Laterality: Right;  . MEDIASTINOSCOPY N/A 01/13/2017   Procedure: MEDIASTINOSCOPY;  Surgeon: Melrose Nakayama, MD;  Location: [facility] OR;  Service: Thoracic;  Laterality: N/A;  . WISDOM TOOTH EXTRACTION          Home Medications    Prior to Admission medications   Medication Sig Start Date End Date Taking? Authorizing Provider  acetaminophen (TYLENOL) 500 MG tablet Take 1,000 mg by mouth every 6 (six) hours as needed for moderate pain.   Yes [provider]  albuterol (PROVENTIL HFA;VENTOLIN HFA) 108 (90 Base) MCG/ACT inhaler Inhale 2 puffs into the lungs every 6 (six) hours as needed for wheezing or shortness of breath. 11/17/16  Yes Rita Ohara, MD  atorvastatin (LIPITOR) 20 MG tablet TAKE 1 TABLET (20 MG TOTAL) BY MOUTH DAILY. Patient taking differently: Take 20 mg by mouth at bedtime.  06/02/17  Yes Rita Ohara, MD  Continuous Blood Gluc Sensor (FREESTYLE LIBRE SENSOR SYSTEM) MISC APPLY ONE SENSOR TO BACK OF UPPER ARM EVERY 14 DAYS 12/19/16  Yes [provider]  insulin aspart (NOVOLOG) 100 UNIT/ML injection Inject into the skin See admin instructions. Via Omnipod insulin pump, continuously   Yes [provider]  Insulin Disposable Pump (OMNIPOD) MISC by Does not apply route.   Yes [provider]  levothyroxine (SYNTHROID, LEVOTHROID) 125 MCG tablet Take 250 mcg by mouth daily before breakfast.   Yes [provider]  NON FORMULARY dOTerra Lifelong Vitality 3-pack: Take 3 tablets from each of the three bottles once a day at  bedtime   Yes [provider]  ondansetron (ZOFRAN ODT) 4 MG disintegrating tablet Take 1 tablet (4 mg total) by mouth every 8 (eight) hours as needed for nausea or vomiting. 07/18/17  Yes Rita Ohara, MD  pantoprazole (PROTONIX) 40 MG tablet TAKE 1 TABLET 30 MINUTES BEFORE BREAKFAST DAILY 07/26/17  Yes Denita Lung, MD  amoxicillin-clavulanate (AUGMENTIN) 875-125 MG tablet Take 1 tablet by mouth 2 (two) times daily. Patient not taking: Reported on 10/15/2017 07/14/17   Rita Ohara, MD  HYDROcodone-acetaminophen [facility]) 5-325 MG tablet Take 1 tablet by mouth every 6 (six) hours as needed for severe pain. 10/15/17   Julianne Rice, MD  HYDROcodone-homatropine [facility]) 5-1.5 MG/5ML syrup Take 5 mLs by mouth every 8 (eight) hours as needed for cough. Patient not taking: Reported on 10/15/2017 01/21/17   Rigoberto Noel, MD  methocarbamol (ROBAXIN) 500 MG tablet Take 2 tablets (1,000 mg total) by mouth every 8 (eight) hours as needed for muscle spasms. 10/15/17   Julianne Rice, MD  predniSONE (DELTASONE) 20 MG tablet 3 tabs po day one, then 2 po daily x 4 days 10/15/17   Julianne Rice, MD    Family History Family History  Problem Relation Age of Onset  . Hypertension Mother   . High Cholesterol Mother   . Diabetes Mother        borderline  . Diabetes Maternal Grandmother   . Diabetes Paternal Grandfather   . Heart attack Father 60  . High Cholesterol Father   . Diabetes Maternal Grandfather   . Cancer Paternal Uncle        throat cancer (smoker)    Social History Social History   Tobacco Use  . Smoking status: Never Smoker  . Smokeless tobacco: Never Used  Substance Use Topics  . Alcohol use: No  . Drug use: No     Allergies   Nsaids   Review of Systems Review of Systems  Constitutional: Positive for diaphoresis. Negative for chills and fever.  Eyes: Negative for visual disturbance.  Respiratory: Positive for shortness of breath. Negative for cough and wheezing.     Cardiovascular: Positive for chest pain. Negative for palpitations and leg swelling.  Gastrointestinal: Negative for abdominal pain, diarrhea, nausea and vomiting.  Genitourinary: Negative for dysuria, flank pain, frequency and hematuria.  Musculoskeletal: Positive for back pain. Negative for neck pain and neck stiffness.  Skin: Negative for rash and wound.  Neurological: Negative for dizziness, weakness, light-headedness, numbness and headaches.  All other systems reviewed and are negative.    Physical Exam Updated Vital Signs BP (!) 126/99 (BP Location: Right Arm)   Pulse (!) 105   Temp 98 F (36.7 C) (Oral)   Resp 17   Ht 5\' 7"  (1.702 m)   Wt 95.3 kg (210 lb)   SpO2 97%   BMI 32.89 kg/m   Physical Exam  Constitutional: He is oriented to person, place, and time. He appears well-developed and well-nourished. No distress.  HENT:  Head: Normocephalic and atraumatic.  Mouth/Throat: Oropharynx is clear and moist. No oropharyngeal exudate.  Eyes: Pupils are equal, round, and reactive to light. EOM are normal.  Neck: Normal range of motion. Neck supple.  Cardiovascular: Normal rate and regular rhythm. Exam reveals no gallop and no friction rub.  No murmur heard. Pulmonary/Chest: Effort normal and breath sounds normal. No stridor. No respiratory distress. He has no wheezes. He has no rales. He exhibits tenderness.  Patient does have some left lower and lateral chest wall tenderness to palpation.  There is no crepitance or deformity.  Abdominal: Soft. Bowel sounds are normal. There is no tenderness. There is no rebound and no guarding.  Musculoskeletal: Normal range of motion. He exhibits no edema or tenderness.  No midline thoracic or lumbar tenderness.  No CVA tenderness.  No lower extremity swelling, asymmetry or tenderness.  Distal pulses are 2+.  Neurological: He is alert and oriented to person, place, and time.  Moving all extremities without focal deficit.  Sensation fully  intact.  Skin: Skin is warm and dry. No rash noted. He is not diaphoretic. No erythema.  Psychiatric: He has a normal mood and affect. His behavior is normal.  Nursing note and vitals reviewed.    ED Treatments / Results  Labs (all labs ordered are listed, but only abnormal results are displayed) Labs Reviewed  BASIC METABOLIC PANEL - Abnormal; Notable for the following components:      Result Value   Glucose, Bld 104 (*)    All other components within normal limits  CBC - Abnormal; Notable for the following components:   WBC 12.4 (*)    All other components within normal limits  I-STAT TROPONIN, ED  CBG MONITORING, ED  I-STAT TROPONIN, ED    EKG EKG Interpretation  Date/Time:  Saturday October 15 2017 18:12:03 EDT Ventricular Rate:  113 PR Interval:  148 QRS Duration: 82 QT Interval:  314 QTC Calculation: 430 R Axis:   82 Text Interpretation:  Sinus tachycardia Abnormal QRS-T angle, consider primary T wave abnormality Abnormal ECG Confirmed by Julianne Rice [phone]) on 10/15/2017 7:03:29 PM   Radiology Dg Chest 2 View  Result Date: 10/15/2017 CLINICAL DATA:  Central and left-sided chest pain. EXAM: CHEST - 2 VIEW COMPARISON:  Chest x-ray dated March 25, 2017. FINDINGS: The heart size and mediastinal contours are within normal limits. Normal pulmonary vascularity. No focal consolidation, pleural effusion, or pneumothorax. No acute osseous abnormality. IMPRESSION: No active cardiopulmonary disease. Electronically Signed   By: Titus Dubin M.D.   On: 10/15/2017 18:56   Ct Angio Chest Pe W And/or Wo Contrast  Result Date: 10/15/2017 CLINICAL DATA:  Central and left-sided chest pain for several hours, initial encounter EXAM: CT ANGIOGRAPHY CHEST WITH CONTRAST TECHNIQUE: Multidetector CT imaging of the chest was performed using the standard protocol during bolus administration of intravenous contrast. Multiplanar CT image reconstructions and MIPs were obtained to evaluate the  vascular anatomy. CONTRAST:  18mL ISOVUE-370 IOPAMIDOL (ISOVUE-370) INJECTION 76% COMPARISON:  Chest x-ray from earlier in the same day. FINDINGS: Cardiovascular: Thoracic aorta shows no evidence of aneurysmal dilatation or dissection. No significant atherosclerotic change is noted. No cardiac enlargement is seen. The pulmonary artery shows a normal branching pattern without definitive filling defects to suggest pulmonary embolism. Mediastinum/Nodes: Thoracic inlet is within normal limits. The previously seen hilar and mediastinal adenopathy has resolved in the interval. A few scattered small lymph nodes are noted but significantly smaller than that seen on the prior exam consistent with resolution of previously seen  inflammatory change. Lungs/Pleura: Lungs are well aerated bilaterally. No focal infiltrate or sizable effusion is seen. Upper Abdomen: No focal abnormality is noted in the upper abdomen. Musculoskeletal: No chest wall abnormality. No acute or significant osseous findings. Review of the MIP images confirms the above findings. IMPRESSION: No evidence of pulmonary emboli. Resolution of previously seen hilar and mediastinal adenopathy. Resolution of the patchy infiltrates in the lower lobes bilaterally. No acute abnormality seen. Electronically Signed   By: Inez Catalina M.D.   On: 10/15/2017 20:20    Procedures Procedures (including critical care time)  Medications Ordered in ED Medications  iopamidol (ISOVUE-370) 76 % injection (has no administration in time range)  iopamidol (ISOVUE-370) 76 % injection 100 mL (100 mLs Intravenous Contrast Given 10/15/17 1957)  ketorolac (TORADOL) 30 MG/ML injection 30 mg (30 mg Intravenous Given 10/15/17 2127)  sodium chloride 0.9 % bolus 1,000 mL (0 mLs Intravenous Stopped 10/15/17 2246)     Initial Impression / Assessment and Plan / ED Course  I have reviewed the triage vital signs and the nursing notes.  Pertinent labs & imaging results that were  available during my care of the patient were reviewed by me and considered in my medical decision making (see chart for details).     CT angios without evidence of PE.  Pain appears to be reproduced completely with palpation.  Suspect chest wall etiology. Pain is improved.  Vital signs stable.  Troponin x2 is normal.  Patient advised to follow-up closely with his primary physician.  Return precautions given. Final Clinical Impressions(s) / ED Diagnoses   Final diagnoses:  Chest wall pain    ED Discharge Orders        Ordered    HYDROcodone-acetaminophen (NORCO) 5-325 MG tablet  Every 6 hours PRN     10/15/17 2316    predniSONE (DELTASONE) 20 MG tablet     10/15/17 2316    methocarbamol (ROBAXIN) 500 MG tablet  Every 8 hours PRN     10/15/17 2318       Julianne Rice, MD 10/15/17 2319

## 2017-10-15 NOTE — ED Provider Notes (Signed)
Chest pain. EKG looks fine. Per nurse, he is still symptomatic. Give ASA and send to ED. I recommended transportation by [facility] or [facility]. Patient not evaluated by me but was precepted.   Kinnie Feil, MD 10/15/17 1757

## 2017-10-15 NOTE — ED Notes (Signed)
Pt in XR, XR to bring to room

## 2017-10-15 NOTE — Discharge Instructions (Addendum)
Make an appointment to follow-up closely with your primary physician.

## 2017-10-24 ENCOUNTER — Encounter: Payer: Self-pay | Admitting: Family Medicine

## 2017-10-25 NOTE — Progress Notes (Signed)
Chief Complaint  Patient presents with  . ER follow up    from ER visit on 6/29 for chest pain. States that he feeling better today but chest pain still come from time to time.     Patient presents for hospital follow-up. On 10/15/17 he went to [facility], who sent him to ER to evaluate his abrupt onset left-sided chest pain radiating to his left thoracic back.  It had started in the afternoon, while sitting watching a baseball game.  Pain was worse with deep breathing.  Eval included CXR (normal), EKG, labs (neg troponin x2, WBC 12.4, rest of CBC and chem normal), CT angio, which was negative.  It was felt that his pain was reproducible with palpitation, felt to be chest wall etiology.  He was discharged with rx for prednisone (60mg  x 1d, 40mg  x 4d), robaxin, and hydrocodone (norco 5-325mg ; only required 2 tablets).  Has some mild pain intermittently, at the left of mid-sternum.  Sharp, short-lived.  No fever, cough.  2 days ago he felt a little lightheaded when he stood up fast.  Felt better yesterday. The day prior he had been out all day at [facility], in the hot sun.  PMH, PSH, SH, reviewed  Outpatient Encounter Medications as of 10/26/2017  Medication Sig  . atorvastatin (LIPITOR) 20 MG tablet TAKE 1 TABLET (20 MG TOTAL) BY MOUTH DAILY. (Patient taking differently: Take 20 mg by mouth at bedtime. )  . Continuous Blood Gluc Sensor (FREESTYLE LIBRE SENSOR SYSTEM) MISC APPLY ONE SENSOR TO BACK OF UPPER ARM EVERY 14 DAYS  . insulin aspart (NOVOLOG) 100 UNIT/ML injection Inject into the skin See admin instructions. Via Omnipod insulin pump, continuously  . Insulin Disposable Pump (OMNIPOD) MISC by Does not apply route.  Marland Kitchen levothyroxine (SYNTHROID, LEVOTHROID) 125 MCG tablet Take 250 mcg by mouth daily before breakfast.  . NON FORMULARY dOTerra Lifelong Vitality 3-pack: Take 3 tablets from each of the three bottles once a day at bedtime  . pantoprazole (PROTONIX) 40 MG tablet TAKE 1 TABLET 30 MINUTES  BEFORE BREAKFAST DAILY  . acetaminophen (TYLENOL) 500 MG tablet Take 1,000 mg by mouth every 6 (six) hours as needed for moderate pain.  Marland Kitchen albuterol (PROVENTIL HFA;VENTOLIN HFA) 108 (90 Base) MCG/ACT inhaler Inhale 2 puffs into the lungs every 6 (six) hours as needed for wheezing or shortness of breath. (Patient not taking: Reported on 10/26/2017)  . HYDROcodone-acetaminophen (NORCO) 5-325 MG tablet Take 1 tablet by mouth every 6 (six) hours as needed for severe pain. (Patient not taking: Reported on 10/26/2017)  . methocarbamol (ROBAXIN) 500 MG tablet Take 2 tablets (1,000 mg total) by mouth every 8 (eight) hours as needed for muscle spasms. (Patient not taking: Reported on 10/26/2017)  . [DISCONTINUED] amoxicillin-clavulanate (AUGMENTIN) 875-125 MG tablet Take 1 tablet by mouth 2 (two) times daily. (Patient not taking: Reported on 10/15/2017)  . [DISCONTINUED] HYDROcodone-homatropine (HYCODAN) 5-1.5 MG/5ML syrup Take 5 mLs by mouth every 8 (eight) hours as needed for cough. (Patient not taking: Reported on 10/15/2017)  . [DISCONTINUED] ondansetron (ZOFRAN ODT) 4 MG disintegrating tablet Take 1 tablet (4 mg total) by mouth every 8 (eight) hours as needed for nausea or vomiting.  . [DISCONTINUED] predniSONE (DELTASONE) 20 MG tablet 3 tabs po day one, then 2 po daily x 4 days   No facility-administered encounter medications on file as of 10/26/2017.    Allergies  Allergen Reactions  . Nsaids Other (See Comments)    HAS ULCERS AND CAN TOLERATE ONLY  TYLENOL    ROS: no fever, chills, headaches.  Lightheadedness 2d ago, resolved. No palpitations, shortness of breath, cough or URI symptoms. No GI complaints. See HPI.  PHYSICAL EXAM:  BP 130/88   Pulse 100   Ht 5\' 7"  (1.702 m)   Wt 215 lb (97.5 kg)   BMI 33.67 kg/m   Well-appearing, tanned male in no distress HEENT: conjunctiva and sclera clear, EOMI. Moist mucus membranes Neck: no lymphadenopathy or mass Heart: regular rate and rhythm, P 96 on  recheck. No murmur Lungs: clear bilaterally Chest wall nontender.  No longer has reproducibility at left lateral chest wall. Back: no spinal or CVA tenderness Abdomen: soft, nontender, no mass Skin: tan, normal turgor, no rash Psych: normal mood, affect, hygiene and grooming Neuro: alert and oriented, cranial nerves intact, normal gait.  ASSESSMENT/PLAN:   Chest wall pain - resolved  Type 1 diabetes mellitus without complications (HCC) - elevated sugars recently due to prednisone use.  Back down today  Lightheadedness - 2 days ago, suspect related to dehydration (from heat and/or hyperglycemia); resolved.

## 2017-10-26 ENCOUNTER — Encounter: Payer: Self-pay | Admitting: Family Medicine

## 2017-10-26 ENCOUNTER — Ambulatory Visit: Payer: 59 | Admitting: Family Medicine

## 2017-10-26 VITALS — BP 130/88 | HR 100 | Ht 67.0 in | Wt 215.0 lb

## 2017-10-26 DIAGNOSIS — E109 Type 1 diabetes mellitus without complications: Secondary | ICD-10-CM | POA: Diagnosis not present

## 2017-10-26 DIAGNOSIS — R42 Dizziness and giddiness: Secondary | ICD-10-CM | POA: Diagnosis not present

## 2017-10-26 DIAGNOSIS — R0789 Other chest pain: Secondary | ICD-10-CM

## 2017-10-26 NOTE — Patient Instructions (Signed)
Stay well hydrated.  That might be why you felt dizzy on Monday (combination of dehydration, being outside in the sun all day, and also contributed by very high sugars from the prednisone).  Your back pain seems to be better. If you have recurrent discomfort, try heat, stretches, muscle relaxant and anti-inflammatory such as ibuprofen or aleve (short-term).

## 2017-12-09 LAB — MICROALBUMIN, URINE: Microalb, Ur: 10.6

## 2018-01-04 NOTE — Progress Notes (Signed)
Chief Complaint  Patient presents with  . Annual Exam    nonfasting annual exam. No concerns. Going to see Dr Delman Cheadle for eye exam in the next few weeks.     Alejandro Griffin is a 37 y.o. male who presents for a complete physical.  He has the following concerns:  Last seen in July for ER f/u from chest wall pain. No further problems with chest pain.  Sarcoidosis: Under the care of Dr. Elsworth Soho; last visit was in March, scheduled for early October. No longer on Prednisone.  Cough is much better, only occasional cough.  Type 1 diabetes: under the care of Dr. Buddy Duty.  He has insulin pump. He checks his feet regularly, denies concerns.   Last diabetic eye exam was 01/2017, no retinopathy present. We had not been getting records from Dr. Zenda Alpers this morning, and got visit notes from 8/23, with labs: A1c 9.4 (up from 9.2 in April, down from 10.5 in January) c-met notable for glu 278, Cr 1.05, Na 134; other electrolytes and liver tests normal. TSH 0.13.  Was 1.01 in April, 35.60 in January Lipids: TC 220, TG 95, HDL 42, LDL 159, chol/HDL ratio 5.3  Hypothyroidism: Managed by Dr. Buddy Duty. Results as reported above.  Dose was decreased to 216mg (from 250) after last labs last month. He denies changes in hair/skin/bowels/energy.   Hyperlipidemia: He is on atorvastatin.  When this was seen/discussed in 05/2017, he admits that he was missing his cholesterol medication up to 2x/week. He denies missed pills. His thyroid had been up and down (under and over-replaced), which can affect the lipids. See lipids above.  Last check prior was at Dr. KCindra Eves in 11/02/16: Total chol 179, HDL 43; Chol/HDL 4.2, LDL 107 ,TG 148 (TSH 12.27 at that time).  Vitamin D deficiency:  Noted to be low at 20 in 2016; last check was 27 last year, when on Duterra vitamins. He was asked to take an additional 1000 IU of D3 in addition to those vitamins.  He has not been taking any separate vitamin D.  History of gastritis.  Chart  was reviewed upon seeing that he has been on pantoprazole daily for years.  It appears it was started in 10/2014 after EGD showed gastritis. Last f/u with GI was in 11/2014, when it was discussed that he may need it for another 3 months.  He has been taking it ever since.  He had originally felt there was a correlation with his being started on thyroid medication and cycles of vomiting. It likely was maintained while he was on prednisone (not sure if intentional or unintentional), but he is no longer on prednisone.  He denies any problems with indigestion or reflux.     Immunization History  Administered Date(s) Administered  . Influenza,inj,Quad PF,6+ Mos 01/24/2013, 02/28/2014, 01/16/2016, 01/21/2017  . Pneumococcal Polysaccharide-23 02/25/2017  . 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 Walks 5 minute intervals frequently. Heavy lifting at work. 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 Cough   . Diabetes mellitus without complication ([facility]   . Dyspnea   . Gastritis 2016   confirmed on bx on EGD. Dr. PHilarie Fredrickson . GERD (gastroesophageal reflux disease)   . Hyperlipidemia   . Hypothyroidism   . Inguinal hernia    RT  . Low  testosterone 2016   better on repeat in 2017  . Obesity   . Sarcoidosis   . Vitamin D deficiency     Past Surgical History:  Procedure Laterality Date  . HERNIA REPAIR    . INGUINAL HERNIA REPAIR Right 02/20/2015   Procedure: RIGHT INGUINAL HERNIA REPAIR WITH MESH;  Surgeon: Donnie Mesa, MD;  Location: [facility];  Service: General;  Laterality: Right;  . INSERTION OF MESH Right 02/20/2015   Procedure: INSERTION OF MESH;  Surgeon: Donnie Mesa, MD;  Location: [facility];  Service: General;  Laterality: Right;  . MEDIASTINOSCOPY N/A 01/13/2017   Procedure:  MEDIASTINOSCOPY;  Surgeon: Melrose Nakayama, MD;  Location: [facility] OR;  Service: Thoracic;  Laterality: N/A;  . WISDOM TOOTH EXTRACTION      Social History   Socioeconomic History  . Marital status: Married    Spouse name: Not on file  . Number of children: 1  . Years of education: Not on file  . Highest education level: Not on file  Occupational History  . Occupation: Arboriculturist  . Financial resource strain: Not on file  . Food insecurity:    Worry: Not on file    Inability: Not on file  . Transportation needs:    Medical: Not on file    Non-medical: Not on file  Tobacco Use  . Smoking status: Never Smoker  . Smokeless tobacco: Never Used  Substance and Sexual Activity  . Alcohol use: No  . Drug use: No  . Sexual activity: Yes    Partners: Female  Lifestyle  . Physical activity:    Days per week: Not on file    Minutes per session: Not on file  . Stress: Not on file  Relationships  . Social connections:    Talks on phone: Not on file    Gets together: Not on file    Attends religious service: Not on file    Active member of club or organization: Not on file    Attends meetings of clubs or organizations: Not on file    Relationship status: Not on file  . Intimate partner violence:    Fear of current or ex partner: Not on file    Emotionally abused: Not on file    Physically abused: Not on file    Forced sexual activity: Not on file  Other Topics Concern  . Not on file  Social History Narrative   Married, 10 yo son, 3 cats (1 is his brother's); brother is also living with them currently.   No tobacco exposure   Heavy industrial electrical at [employer]    Family History  Problem Relation Age of Onset  . Hypertension Mother   . High Cholesterol Mother   . Diabetes Mother        borderline  . Diabetes Maternal Grandmother   . Diabetes Paternal Grandfather   . Heart attack Father 97  . High Cholesterol Father   . Diabetes Maternal Grandfather    . Cancer Paternal Uncle        throat cancer (smoker)    Outpatient Encounter Medications as of 01/05/2018  Medication Sig  . atorvastatin (LIPITOR) 20 MG tablet TAKE 1 TABLET (20 MG TOTAL) BY MOUTH DAILY. (Patient taking differently: Take 20 mg by mouth at bedtime. )  . Continuous Blood Gluc Sensor (FREESTYLE LIBRE SENSOR SYSTEM) MISC APPLY ONE SENSOR TO BACK OF UPPER ARM EVERY 14 DAYS  . insulin aspart (NOVOLOG) 100 UNIT/ML injection Inject into  the skin See admin instructions. Via Omnipod insulin pump, continuously  . Insulin Disposable Pump (OMNIPOD) MISC by Does not apply route.  Marland Kitchen levothyroxine (SYNTHROID, LEVOTHROID) 112 MCG tablet Take 224 mcg by mouth daily before breakfast.  . NON FORMULARY dOTerra Lifelong Vitality 3-pack: Take 3 tablets from each of the three bottles once a day at bedtime  . pantoprazole (PROTONIX) 40 MG tablet TAKE 1 TABLET 30 MINUTES BEFORE BREAKFAST DAILY  . [DISCONTINUED] levothyroxine (SYNTHROID, LEVOTHROID) 125 MCG tablet Take 250 mcg by mouth daily before breakfast.  . acetaminophen (TYLENOL) 500 MG tablet Take 1,000 mg by mouth every 6 (six) hours as needed for moderate pain.  Marland Kitchen albuterol (PROVENTIL HFA;VENTOLIN HFA) 108 (90 Base) MCG/ACT inhaler Inhale 2 puffs into the lungs every 6 (six) hours as needed for wheezing or shortness of breath. (Patient not taking: Reported on 10/26/2017)  . [DISCONTINUED] HYDROcodone-acetaminophen (NORCO) 5-325 MG tablet Take 1 tablet by mouth every 6 (six) hours as needed for severe pain. (Patient not taking: Reported on 10/26/2017)  . [DISCONTINUED] methocarbamol (ROBAXIN) 500 MG tablet Take 2 tablets (1,000 mg total) by mouth every 8 (eight) hours as needed for muscle spasms. (Patient not taking: Reported on 10/26/2017)   No facility-administered encounter medications on file as of 01/05/2018.     Allergies  Allergen Reactions  . Nsaids Other (See Comments)    HAS ULCERS AND CAN TOLERATE ONLY TYLENOL    ROS: The patient  denies anorexia, fever, weight changes, headaches, vision loss, decreased hearing, ear pain, hoarseness, chest pain, palpitations, dizziness, syncope, dyspnea on exertion, swelling, nausea, vomiting, diarrhea, constipation, abdominal pain, melena, hematochezia, indigestion/heartburn, hematuria, incontinence, erectile dysfunction, nocturia, weakened urine stream, dysuria, genital lesions, joint pains, numbness, tingling, weakness, tremor, suspicious skin lesions, depression, anxiety, abnormal bleeding/bruising. Cough resolved. Bone spur R shoulder, causing pain. Under the care of Dr. French Ana.  Getting PT.  May need surgery next year.   PHYSICAL EXAM:  BP 122/90   Pulse 100   Ht 5' 8.5" (1.74 m)   Wt 214 lb (97.1 kg)   BMI 32.07 kg/m    114/84 on repeat  Wt Readings from Last 3 Encounters:  01/05/18 214 lb (97.1 kg)  10/26/17 215 lb (97.5 kg)  10/15/17 210 lb (95.3 kg)    Wt 208# 9.6 oz at last CPE 1 year ago  General Appearance:    Alert, cooperative, no distress, appears stated age. No coughing.  Head:    Normocephalic, without obvious abnormality, atraumatic  Eyes:    PERRL, conjunctiva/corneas clear, EOM's intact, fundi benign  Ears:    Normal TM's and external ear canals. Some TM scarring noted.  Nose:   Nares normal, mucosa normal, no drainage or sinus tenderness  Throat:   Lips, mucosa, and tongue normal; teeth and gums normal  Neck:   Supple, no lymphadenopathy;  thyroid:  no enlargement/ tenderness/nodules; no carotid bruit or JVD  Back:    Spine nontender, no curvature, ROM normal, no CVA tenderness  Lungs:     Clear to auscultation bilaterally without wheezes, rales or ronchi; respirations unlabored  Chest Wall:    No tenderness or deformity   Heart:    Regular rate and rhythm, S1 and S2 normal, no murmur, rub or gallop  Breast Exam:    No chest wall tenderness, masses or gynecomastia  Abdomen:     Soft, non-tender, nondistended, normoactive bowel sounds, no masses, no  hepatosplenomegaly  Genitalia:    Normal male external genitalia without lesions.  Testicles  without masses.  No inguinal hernias.  Rectal:   Deferred due to age <40 and lack of symptoms  Extremities:   No clubbing, cyanosis or edema  Pulses:   2+ and symmetric all extremities  Skin:   Skin color, texture, turgor normal, no rashes or lesions  Lymph nodes:   Cervical, supraclavicular, and axillary nodes normal  Neurologic:   CNII-XII intact, normal strength, sensation and gait; reflexes 2+ and symmetric throughout                                Psych:  Normal mood, affect, hygiene and grooming  Normal diabetic foot exam   ASSESSMENT/PLAN:  Annual physical exam - Plan: POCT Urinalysis DIP (Proadvantage Device)  Need for influenza vaccination - Plan: Flu Vaccine QUAD 6+ mos PF IM (Fluarix Quad PF)  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 thyroid likely contributing, but above goal when underreplaced also. Diet reviewed. Increase lipitor to 40m. recheck 2 mos - Plan: atorvastatin (LIPITOR) 40 MG tablet, Lipid panel, Hepatic function panel  Vitamin D deficiency - Plan: VITAMIN D 25 Hydroxy (Vit-D Deficiency, Fractures)  Type 1 diabetes mellitus without complications (HCC) - poorly controlled per Dr. KCindra Evesnotes/results. Discussed importance of good control. Reviewed diet, exercise, need for wt loss  Sarcoidosis - off prednisone and doing well without recurrent cough. Has f/u with pulm soon  History of gastritis - currently asymptomatic and off prednisone. Trial tapering off PPI recommended  Other specified hypothyroidism - over-replaced per recent labs. Dose decreased, and has f/u scheduled in October with Dr. KBuddy Duty per pt  Medication monitoring encounter - Plan: Lipid panel, Hepatic function panel   F/u TSH through Dr. KBuddy Dutymid October.  Recommended at least 30 minutes of aerobic activity at least 5 days/week, weight  bearing exercise at least 2x/week; proper sunscreen use reviewed; healthy diet and alcohol recommendations (less than or equal to 2 drinks/day) reviewed; regular seatbelt use; changing batteries in smoke detectors. Self-testicular exams. Immunization recommendations discussed--flu shots yearly, given today.   Vitamin D3 (which is over-the-counter) is recommended in addition to your DMeyer Russel(last year your level was low; we are rechecking this year).  You will be told exactly how much to take once we get your lab results.  Increase your atorvastatin to 446mdaily.  I'm sending a new prescription but you can double up on the 2021mablets that you currently have. We should recheck fasting cholesterol panel and liver tests in 2 months, on the higher dose. Goal LDL is <100.  Taper off the pantoprazole.  I doubt that you need it any longer, but if when you start cutting back (every other day or every 3rd) you develop heartburn or indigestion, then you may continue to use it, either daily or just as needed.  I just want to see if you even need it at all, now that your cough has resolved and you no longer have any symptoms to warrant continued use, especially now that you are no longer on chronic prednisone.

## 2018-01-05 ENCOUNTER — Ambulatory Visit: Payer: 59 | Admitting: Family Medicine

## 2018-01-05 ENCOUNTER — Encounter: Payer: Self-pay | Admitting: Family Medicine

## 2018-01-05 VITALS — BP 114/84 | HR 100 | Ht 68.5 in | Wt 214.0 lb

## 2018-01-05 DIAGNOSIS — Z5181 Encounter for therapeutic drug level monitoring: Secondary | ICD-10-CM

## 2018-01-05 DIAGNOSIS — E109 Type 1 diabetes mellitus without complications: Secondary | ICD-10-CM

## 2018-01-05 DIAGNOSIS — D869 Sarcoidosis, unspecified: Secondary | ICD-10-CM | POA: Diagnosis not present

## 2018-01-05 DIAGNOSIS — E782 Mixed hyperlipidemia: Secondary | ICD-10-CM

## 2018-01-05 DIAGNOSIS — Z23 Encounter for immunization: Secondary | ICD-10-CM | POA: Diagnosis not present

## 2018-01-05 DIAGNOSIS — Z0000 Encounter for general adult medical examination without abnormal findings: Secondary | ICD-10-CM | POA: Diagnosis not present

## 2018-01-05 DIAGNOSIS — E559 Vitamin D deficiency, unspecified: Secondary | ICD-10-CM

## 2018-01-05 DIAGNOSIS — E1069 Type 1 diabetes mellitus with other specified complication: Secondary | ICD-10-CM | POA: Diagnosis not present

## 2018-01-05 DIAGNOSIS — E038 Other specified hypothyroidism: Secondary | ICD-10-CM

## 2018-01-05 DIAGNOSIS — Z8719 Personal history of other diseases of the digestive system: Secondary | ICD-10-CM

## 2018-01-05 LAB — POCT URINALYSIS DIP (PROADVANTAGE DEVICE)
Bilirubin, UA: NEGATIVE
Blood, UA: NEGATIVE
Glucose, UA: NEGATIVE mg/dL
Ketones, POC UA: NEGATIVE mg/dL
Leukocytes, UA: NEGATIVE
Nitrite, UA: NEGATIVE
Protein Ur, POC: NEGATIVE mg/dL
Specific Gravity, Urine: 1.025
Urobilinogen, Ur: NEGATIVE
pH, UA: 5.5 (ref 5.0–8.0)

## 2018-01-05 MED ORDER — ATORVASTATIN CALCIUM 40 MG PO TABS: 40.0000 mg | ORAL_TABLET | Freq: Every day | ORAL | 0 refills | Status: DC

## 2018-01-05 NOTE — Patient Instructions (Addendum)
HEALTH MAINTENANCE RECOMMENDATIONS:  It is recommended that you get at least 30 minutes of aerobic exercise at least 5 days/week (for weight loss, you may need as much as 60-90 minutes). This can be any activity that gets your heart rate up. This can be divided in 10-15 minute intervals if needed, but try and build up your endurance at least once a week.  Weight bearing exercise is also recommended twice weekly.  Eat a healthy diet with lots of vegetables, fruits and fiber.  "Colorful" foods have a lot of vitamins (ie green vegetables, tomatoes, red peppers, etc).  Limit sweet tea, regular sodas and alcoholic beverages, all of which has a lot of calories and sugar.  Up to 2 alcoholic drinks daily may be beneficial for men (unless trying to lose weight, watch sugars).  Drink a lot of water.  Sunscreen of at least SPF 30 should be used on all sun-exposed parts of the skin when outside between the hours of 10 am and 4 pm (not just when at beach or pool, but even with exercise, golf, tennis, and yard work!)  Use a sunscreen that says "broad spectrum" so it covers both UVA and UVB rays, and make sure to reapply every 1-2 hours.  Remember to change the batteries in your smoke detectors when changing your clock times in the spring and fall.  Use your seat belt every time you are in a car, and please drive safely and not be distracted with cell phones and texting while driving.   Taper off the pantoprazole.  I doubt that you need it any longer, but if when you start cutting back (every other day or every 3rd) you develop heartburn or indigestion, then you may continue to use it, either daily or just as needed.  I just want to see if you even need it at all, now that your cough has resolved and you no longer have any symptoms to warrant continued use, especially now that you are no longer on chronic prednisone.  Vitamin D3 (which is over-the-counter) is recommended in addition to your Meyer Russel (last year your  level was low; we are rechecking this year).  You will be told exactly how much to take once we get your lab results.  Increase your atorvastatin to 40mg  daily.  I'm sending a new prescription but you can double up on the 20mg  tablets that you currently have. We should recheck fasting cholesterol panel and liver tests in 2 months, on the higher dose. Goal LDL is <100.   Fat and Cholesterol Restricted Diet Getting too much fat and cholesterol in your diet may cause health problems. Following this diet helps keep your fat and cholesterol at normal levels. This can keep you from getting sick. What types of fat should I choose?  Choose monosaturated and polyunsaturated fats. These are found in foods such as olive oil, canola oil, flaxseeds, walnuts, almonds, and seeds.  Eat more omega-3 fats. Good choices include salmon, mackerel, sardines, tuna, flaxseed oil, and ground flaxseeds.  Limit saturated fats. These are in animal products such as meats, butter, and cream. They can also be in plant products such as palm oil, palm kernel oil, and coconut oil.  Avoid foods with partially hydrogenated oils in them. These contain trans fats. Examples of foods that have trans fats are stick margarine, some tub margarines, cookies, crackers, and other baked goods. What general guidelines do I need to follow?  Check food labels. Look for the words "trans fat"  and "saturated fat."  When preparing a meal: ? Fill half of your plate with vegetables and green salads. ? Fill one fourth of your plate with whole grains. Look for the word "whole" as the first word in the ingredient list. ? Fill one fourth of your plate with lean protein foods.  Eat more foods that have fiber, like apples, carrots, beans, peas, and barley.  Eat more home-cooked foods. Eat less at restaurants and buffets.  Limit or avoid alcohol.  Limit foods high in starch and sugar.  Limit fried foods.  Cook foods without frying them.  Baking, boiling, grilling, and broiling are all great options.  Lose weight if you are overweight. Losing even a small amount of weight can help your overall health. It can also help prevent diseases such as diabetes and heart disease. What foods can I eat? Grains Whole grains, such as whole wheat or whole grain breads, crackers, cereals, and pasta. Unsweetened oatmeal, bulgur, barley, quinoa, or brown rice. Corn or whole wheat flour tortillas. Vegetables Fresh or frozen vegetables (raw, steamed, roasted, or grilled). Green salads. Fruits All fresh, canned (in natural juice), or frozen fruits. Meat and Other Protein Products Ground beef (85% or leaner), grass-fed beef, or beef trimmed of fat. Skinless chicken or [place]. Ground chicken or [place]. Pork trimmed of fat. All fish and seafood. Eggs. Dried beans, peas, or lentils. Unsalted nuts or seeds. Unsalted canned or dry beans. Dairy Low-fat dairy products, such as skim or 1% milk, 2% or reduced-fat cheeses, low-fat ricotta or cottage cheese, or plain low-fat yogurt. Fats and Oils Tub margarines without trans fats. Light or reduced-fat mayonnaise and salad dressings. Avocado. Olive, canola, sesame, or safflower oils. Natural peanut or almond butter (choose ones without added sugar and oil). The items listed above may not be a complete list of recommended foods or beverages. Contact your dietitian for more options. What foods are not recommended? Grains White bread. White pasta. White rice. Cornbread. Bagels, pastries, and croissants. Crackers that contain trans fat. Vegetables White potatoes. Corn. Creamed or fried vegetables. Vegetables in a cheese sauce. Fruits Dried fruits. Canned fruit in light or heavy syrup. Fruit juice. Meat and Other Protein Products Fatty cuts of meat. Ribs, chicken wings, bacon, sausage, bologna, salami, chitterlings, fatback, hot dogs, bratwurst, and packaged luncheon meats. Liver and organ meats. Dairy Whole or  2% milk, cream, half-and-half, and cream cheese. Whole milk cheeses. Whole-fat or sweetened yogurt. Full-fat cheeses. Nondairy creamers and whipped toppings. Processed cheese, cheese spreads, or cheese curds. Sweets and Desserts Corn syrup, sugars, honey, and molasses. Candy. Jam and jelly. Syrup. Sweetened cereals. Cookies, pies, cakes, donuts, muffins, and ice cream. Fats and Oils Butter, stick margarine, lard, shortening, ghee, or bacon fat. Coconut, palm kernel, or palm oils. Beverages Alcohol. Sweetened drinks (such as sodas, lemonade, and fruit drinks or punches). The items listed above may not be a complete list of foods and beverages to avoid. Contact your dietitian for more information. This information is not intended to replace advice given to you by your health care provider. Make sure you discuss any questions you have with your health care provider. Document Released: 10/05/2011 Document Revised: 12/11/2015 Document Reviewed: 07/05/2013 Elsevier Interactive Patient Education  [employer].

## 2018-01-06 LAB — VITAMIN D 25 HYDROXY (VIT D DEFICIENCY, FRACTURES): Vit D, 25-Hydroxy: 27.7 ng/mL — ABNORMAL LOW (ref 30.0–100.0)

## 2018-01-08 ENCOUNTER — Encounter: Payer: Self-pay | Admitting: Family Medicine

## 2018-01-10 ENCOUNTER — Encounter: Payer: Self-pay | Admitting: Family Medicine

## 2018-01-20 ENCOUNTER — Encounter: Payer: Self-pay | Admitting: Pulmonary Disease

## 2018-01-20 ENCOUNTER — Ambulatory Visit: Payer: 59 | Admitting: Pulmonary Disease

## 2018-01-20 DIAGNOSIS — D869 Sarcoidosis, unspecified: Secondary | ICD-10-CM | POA: Diagnosis not present

## 2018-01-20 NOTE — Assessment & Plan Note (Signed)
Sarcoidosis appears to be in remission CT scan shows clearing of infiltrate and mediastinal lymphadenopathy. Follow-up chest x-ray in 1 year

## 2018-01-20 NOTE — Progress Notes (Signed)
   Subjective:    Patient ID: Alejandro Griffin, male    DOB: 08-27-1980, 37 y.o.   MRN: [id number]  HPI 68 y otype I diabetic, never smokerFor follow-up ofsarcoidosis  Presented withmediastinal lymphadenopathy  9/27/18He underwent mediastinoscopy biopsy of 4R lymph node which showed noncaseating granulomas, AFB and fungal stains are negative He was treated with prednisone for 4-5 months and gradually tapered to off by feb He has done well, cough is resolved, no dyspnea. Went to the ED for chest pain 09/2017, we reviewed CT angiogram which shows resolution of infiltrates and mediastinal lymphadenopathy. We reviewed PFTs from the past. No skin lesions, undergoes annual eye exam  Significant tests/ events reviewed  CT chest 8/28/18which confirmed mediastinal and bilateral hilar lymphadenopathy and also picked up some lymph nodes in the abdomen.There is an area of consolidation in the right lung base and other areas of groundglass opacification.  ACE level was 18  PFTS11/2018 FEV1 93%, ratio 87, FVC 86%. DLCO 98%.   Review of Systems Patient denies significant dyspnea,cough, hemoptysis,  chest pain, palpitations, pedal edema, orthopnea, paroxysmal nocturnal dyspnea, lightheadedness, nausea, vomiting, abdominal or  leg pains      Objective:   Physical Exam  Gen. Pleasant, well-nourished, in no distress ENT - no thrush, no post nasal drip Neck: No JVD, no thyromegaly, no carotid bruits Lungs: no use of accessory muscles, no dullness to percussion, clear without rales or rhonchi  Cardiovascular: Rhythm regular, heart sounds  normal, no murmurs or gallops, no peripheral edema Musculoskeletal: No deformities, no cyanosis or clubbing        Assessment & Plan:

## 2018-01-20 NOTE — Patient Instructions (Signed)
Sarcoidosis appears to be in remission CT scan shows clearing

## 2018-02-17 ENCOUNTER — Other Ambulatory Visit: Payer: 59

## 2018-02-17 DIAGNOSIS — Z5181 Encounter for therapeutic drug level monitoring: Secondary | ICD-10-CM

## 2018-02-17 DIAGNOSIS — E1069 Type 1 diabetes mellitus with other specified complication: Secondary | ICD-10-CM

## 2018-02-17 DIAGNOSIS — E782 Mixed hyperlipidemia: Secondary | ICD-10-CM

## 2018-02-18 LAB — LIPID PANEL
Chol/HDL Ratio: 3.2 ratio (ref 0.0–5.0)
Cholesterol, Total: 146 mg/dL (ref 100–199)
HDL: 46 mg/dL (ref >39)
LDL Calculated: 88 mg/dL (ref 0–99)
Triglycerides: 59 mg/dL (ref 0–149)
VLDL Cholesterol Cal: 12 mg/dL (ref 5–40)

## 2018-02-18 LAB — HEPATIC FUNCTION PANEL
ALT: 22 IU/L (ref 0–44)
AST: 13 IU/L (ref 0–40)
Albumin: 4.1 g/dL (ref 3.5–5.5)
Alkaline Phosphatase: 82 IU/L (ref 39–117)
Bilirubin Total: 0.4 mg/dL (ref 0.0–1.2)
Bilirubin, Direct: 0.13 mg/dL (ref 0.00–0.40)
Total Protein: 7 g/dL (ref 6.0–8.5)

## 2018-04-01 ENCOUNTER — Other Ambulatory Visit: Payer: Self-pay | Admitting: Family Medicine

## 2018-04-01 DIAGNOSIS — E782 Mixed hyperlipidemia: Secondary | ICD-10-CM

## 2018-04-01 DIAGNOSIS — E1069 Type 1 diabetes mellitus with other specified complication: Secondary | ICD-10-CM

## 2018-04-03 ENCOUNTER — Telehealth: Payer: Self-pay | Admitting: Pulmonary Disease

## 2018-04-03 NOTE — Telephone Encounter (Signed)
His lung function is normal, so cleared to use a respirator if required I am not sure whether he is asking for a letter from [place]?

## 2018-04-03 NOTE — Telephone Encounter (Signed)
Message from [place] encounter that I accidentally closed:  My work is wanting me to get respirator qaulified and was wondering what you think. Please advise me in what you want me to do.  Dr. Elsworth Soho, please advise on this for pt.Thanks!

## 2018-04-03 NOTE — Telephone Encounter (Signed)
Per Dr. Elsworth Soho, His lung function is normal, so cleared to use a respirator if required. Called and spoke with Patient.  Dr. Elsworth Soho recommendations given.  Patient stated understanding.  Nothing further at this time.

## 2018-04-28 LAB — HEMOGLOBIN A1C: Hemoglobin A1C: 9.7

## 2018-05-24 ENCOUNTER — Encounter: Payer: Self-pay | Admitting: *Deleted

## 2018-10-31 ENCOUNTER — Encounter: Payer: Self-pay | Admitting: Family Medicine

## 2018-11-07 ENCOUNTER — Other Ambulatory Visit: Payer: Self-pay | Admitting: Family Medicine

## 2018-11-07 DIAGNOSIS — E1069 Type 1 diabetes mellitus with other specified complication: Secondary | ICD-10-CM

## 2018-11-07 DIAGNOSIS — E782 Mixed hyperlipidemia: Secondary | ICD-10-CM

## 2018-11-24 LAB — MICROALBUMIN, URINE: Microalb, Ur: <0.7

## 2018-11-24 LAB — HEMOGLOBIN A1C: Hemoglobin A1C: 9.7

## 2019-01-10 NOTE — Patient Instructions (Addendum)
HEALTH MAINTENANCE RECOMMENDATIONS:  It is recommended that you get at least 30 minutes of aerobic exercise at least 5 days/week (for weight loss, you may need as much as 60-90 minutes). This can be any activity that gets your heart rate up. This can be divided in 10-15 minute intervals if needed, but try and build up your endurance at least once a week.  Weight bearing exercise is also recommended twice weekly.  Eat a healthy diet with lots of vegetables, fruits and fiber.  "Colorful" foods have a lot of vitamins (ie green vegetables, tomatoes, red peppers, etc).  Limit sweet tea, regular sodas and alcoholic beverages, all of which has a lot of calories and sugar.  Up to 2 alcoholic drinks daily may be beneficial for men (unless trying to lose weight, watch sugars).  Drink a lot of water.  Sunscreen of at least SPF 30 should be used on all sun-exposed parts of the skin when outside between the hours of 10 am and 4 pm (not just when at beach or pool, but even with exercise, golf, tennis, and yard work!)  Use a sunscreen that says "broad spectrum" so it covers both UVA and UVB rays, and make sure to reapply every 1-2 hours.  Remember to change the batteries in your smoke detectors when changing your clock times in the spring and fall. Carbon monoxide detectors are recommended for your home.  Use your seat belt every time you are in a car, and please drive safely and not be distracted with cell phones and texting while driving.  Please start a separate vitamin D3 1000 IU daily in addition to your Doterra vitamins.    Cut back on your portions, and drink more water. Slow down when eating so you can better tell when you feel full.  I need you to work on getting the weight down (losing fat from the waist/abdomen).  You can try some 1% hydrocortisone cream twice daily to the areas of skin that are reacting to the adhesive from your monitor/pump sites.   I recommend starting Claritin or zyrtec or  allegra to treat allergies. You had mucus in your nose, so drainage might be contributing to your cough. If that doesn't help, you might want to restart a medication for reflux (pantoprazole), since reflux could also cause a dry cough (and we stopped your medication last year).    MyPlate from [place] is an outline of a general healthy diet based on the 2010 Dietary Guidelines for Americans, from the U.S. Department of Agriculture Scientist, research (physical sciences)). It sets guidelines for how much food you should eat from each food group based on your age, sex, and level of physical activity. What are tips for following MyPlate? To follow MyPlate recommendations:  Eat a wide variety of fruits and vegetables, grains, and protein foods.  Serve smaller portions and eat less food throughout the day.  Limit portion sizes to avoid overeating.  Enjoy your food.  Get at least 150 minutes of exercise every week. This is about 30 minutes each day, 5 or more days per week. It can be difficult to have every meal look like MyPlate. Think about MyPlate as eating guidelines for an entire day, rather than each individual meal. Fruits and vegetables  Make half of your plate fruits and vegetables.  Eat many different colors of fruits and vegetables each day.  For a 2,000 calorie daily food plan, eat: ? 2 cups of vegetables every day. ? 2 cups of fruit  every day.  1 cup is equal to: ? 1 cup raw or cooked vegetables. ? 1 cup raw fruit. ? 1 medium-sized orange, apple, or banana. ? 1 cup 100% fruit or vegetable juice. ? 2 cups raw leafy greens, such as lettuce, spinach, or kale. ?  cup dried fruit. Grains  One fourth of your plate should be grains.  Make at least half of the grains you eat each day whole grains.  For a 2,000 calorie daily food plan, eat 6 oz of grains every day.  1 oz is equal to: ? 1 slice bread. ? 1 cup cereal. ?  cup cooked rice, cereal, or pasta. Protein  One fourth of your plate  should be protein.  Eat a wide variety of protein foods, including meat, poultry, fish, eggs, beans, nuts, and tofu.  For a 2,000 calorie daily food plan, eat 5 oz of protein every day.  1 oz is equal to: ? 1 oz meat, poultry, or fish. ?  cup cooked beans. ? 1 egg. ?  oz nuts or seeds. ? 1 Tbsp peanut butter. Dairy  Drink fat-free or low-fat (1%) milk.  Eat or drink dairy as a side to meals.  For a 2,000 calorie daily food plan, eat or drink 3 cups of dairy every day.  1 cup is equal to: ? 1 cup milk, yogurt, cottage cheese, or soy milk (soy beverage). ? 2 oz processed cheese. ? 1 oz natural cheese. Fats, oils, salt, and sugars  Only small amounts of oils are recommended.  Avoid foods that are high in calories and low in nutritional value (empty calories), like foods high in fat or added sugars.  Choose foods that are low in salt (sodium). Choose foods that have less than 140 milligrams (mg) of sodium per serving.  Drink water instead of sugary drinks. Drink enough water each day to keep your urine pale yellow. Where to find support  Work with your health care provider or a nutrition specialist (dietitian) to develop a customized eating plan that is right for you.  Download an app (mobile application) to help you track your daily food intake. Where to find more information  Go to [email or web address] for more information.  Learn more and log your daily food intake according to MyPlate using USDA's SuperTracker: www.supertracker.usda.gov Summary  MyPlate is a general guideline for healthy eating from the USDA. It is based on the 2010 Dietary Guidelines for Americans.  In general, fruits and vegetables should take up  of your plate, grains should take up  of your plate, and protein should take up  of your plate. This information is not intended to replace advice given to you by your health care provider. Make sure you discuss any questions you have with your health  care provider. Document Released: 04/25/2007 Document Revised: 05/07/2017 Document Reviewed: 07/05/2016 Elsevier Patient Education  [place].

## 2019-01-10 NOTE — Progress Notes (Signed)
Chief Complaint  Patient presents with  . Annual Exam    fasting annual exam. Does have some labs on his phone from Dr Buddy Duty in August 2020. No new concerns.     Alejandro Griffin is a 38 y.o. male who presents for a complete physical.    Sarcoidosis:  Under the care of Dr. Elsworth Soho. He was last seen 01/2018, at which time it was reported that he appeared to be in remission.  CT showed clearing of infiltrate and mediastinal lymphadenopathy.  It was recommended that he have f/u CXR in 1 year. He has pulmonary appt next month. He has been off prednisone for over a year.  He started coughing again about 2 months ago.  Cough is nonproductive, similar to in the past, but not nearly as bad. Denies any SOB.  Denies any allergy symptoms.  Type 1 diabetes: under the care of Dr. Buddy Duty. He has insulin pump. He checks his feet regularly, denies concerns.  Last diabetic eye exam was 01/2017, no retinopathy present. Didn't go last year.  Needs to check his insurance to see where to go now. Chart reviewed--we got records from Dr. Buddy Duty for a virtual visit in 08/2018, was to f/u in 3 months. No records received after.  Pt states he had f/u in August, and showed me labs from his phone (called over to his office, but couldn't get any info in a timely matter, rec'd notes/results at end of day). His A1c was again 9.7.  He states he had his pump settings adjusted, sugars a little lower, but still higher than they should be. Labs were drawn 11/24/2018: A1c 9.7.  c-met glu 243, rest normal Urine microalb/Cr ratio normal  Hypothyroidism: Managed by Dr. Buddy Duty. Current dose is 237 mcg daily. Last TSH was 3.37 in 04/2018.  He denies changes in hair/skin/bowels/energy.   Hyperlipidemia: He is on atorvastatin. Dose was increased to 12m daily at his physical last year.  LDL was improved on recheck. Lab Results  Component Value Date   CHOL 146 02/17/2018   HDL 46 02/17/2018   LDLCALC 88 02/17/2018   TRIG 59 02/17/2018   CHOLHDL 3.2 02/17/2018    Vitamin D deficiency: Last check was 27.7 a year ago, and had also been 27 the year prior, both times when on [place] vitamins without additional D.  He was told to start 1000 IU of D3 in addition. He never started taking the separate D3.  History of gastritis.  Chart was reviewed upon seeing that he has been on pantoprazole daily for years.  It appears it was started in 10/2014 after EGD showed gastritis. Last f/u with GI was in 11/2014, when it was discussed that he may need it for another 3 months.  He had been taking it ever since.  He had originally felt there was a correlation with his being started on thyroid medication and cycles of vomiting. It likely was maintained while he was on prednisone (not sure if intentional or unintentional). Last year, when no longer on prednisone, he was asked to try and taper off, as he hadn't been having any problems with indigestion or reflux.  He stopped it last year; denies heartburn, reflux or indigestion, but has had recurrent cough x 2 months.  Immunization History  Administered Date(s) Administered  . Influenza,inj,Quad PF,6+ Mos 01/24/2013, 02/28/2014, 01/16/2016, 01/21/2017, 01/05/2018  . Pneumococcal Polysaccharide-23 02/25/2017  . 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 Walks 5 minute intervals frequently. Heavy lifting at work. Occasional neighborhood walks (not regularly).   Past Medical History:  Diagnosis Date  . Cough   . Diabetes mellitus without complication ([place])   . Dyspnea   . Gastritis 2016   confirmed on bx on EGD. Dr. Hilarie Fredrickson  . GERD (gastroesophageal reflux disease)   . Hyperlipidemia   . Hypothyroidism   . Inguinal hernia    RT  . Low testosterone 2016   better on repeat in 2017  . Obesity   . Sarcoidosis   . Vitamin D deficiency     Past Surgical  History:  Procedure Laterality Date  . HERNIA REPAIR    . INGUINAL HERNIA REPAIR Right 02/20/2015   Procedure: RIGHT INGUINAL HERNIA REPAIR WITH MESH;  Surgeon: Donnie Mesa, MD;  Location: [facility];  Service: General;  Laterality: Right;  . INSERTION OF MESH Right 02/20/2015   Procedure: INSERTION OF MESH;  Surgeon: Donnie Mesa, MD;  Location: [facility];  Service: General;  Laterality: Right;  . MEDIASTINOSCOPY N/A 01/13/2017   Procedure: MEDIASTINOSCOPY;  Surgeon: Melrose Nakayama, MD;  [facility];  Service: Thoracic;  Laterality: N/A;  . WISDOM TOOTH EXTRACTION      Social History   Socioeconomic History  . Marital status: Married    Spouse name: Not on file  . Number of children: 1  . Years of education: Not on file  . Highest education level: Not on file  Occupational History  . Occupation: Arboriculturist  . Financial resource strain: Not on file  . Food insecurity    Worry: Not on file    Inability: Not on file  . Transportation needs    Medical: Not on file    Non-medical: Not on file  Tobacco Use  . Smoking status: Never Smoker  . Smokeless tobacco: Never Used  Substance and Sexual Activity  . Alcohol use: No  . Drug use: No  . Sexual activity: Yes    Partners: Female  Lifestyle  . Physical activity    Days per week: Not on file    Minutes per session: Not on file  . Stress: Not on file  Relationships  . Social Herbalist on phone: Not on file    Gets together: Not on file    Attends religious service: Not on file    Active member of club or organization: Not on file    Attends meetings of clubs or organizations: Not on file    Relationship status: Not on file  . Intimate partner violence    Fear of current or ex partner: Not on file    Emotionally abused: Not on file    Physically abused: Not on file    Forced sexual activity: Not on file  Other Topics Concern  . Not on file  Social History Narrative   Married, 82 yo son, 3  cats (1 is his brother's); brother is also living with them currently.   No tobacco exposure   Heavy industrial electrical at [employer]    Family History  Problem Relation Age of Onset  . Hypertension Mother   . High Cholesterol Mother   . Diabetes Mother        borderline  . Diabetes Maternal Grandmother   . Diabetes Paternal Grandfather   . Heart attack Father 71  . High Cholesterol Father   . Diabetes Maternal Grandfather   .  Cancer Paternal Uncle        throat cancer (smoker)    Outpatient Encounter Medications as of 01/11/2019  Medication Sig  . atorvastatin (LIPITOR) 40 MG tablet TAKE 1 TABLET BY MOUTH EVERY DAY  . Continuous Blood Gluc Sensor (FREESTYLE LIBRE SENSOR SYSTEM) MISC APPLY ONE SENSOR TO BACK OF UPPER ARM EVERY 14 DAYS  . insulin aspart (NOVOLOG) 100 UNIT/ML injection Inject into the skin See admin instructions. Via Omnipod insulin pump, continuously  . Insulin Disposable Pump (OMNIPOD) MISC by Does not apply route.  Marland Kitchen levothyroxine (SYNTHROID) 125 MCG tablet 125 mcg.   . levothyroxine (SYNTHROID, LEVOTHROID) 112 MCG tablet Take 112 mcg by mouth daily before breakfast.   . NON FORMULARY dOTerra Lifelong Vitality 3-pack: Take 3 tablets from each of the three bottles once a day at bedtime  . acetaminophen (TYLENOL) 500 MG tablet Take 1,000 mg by mouth every 6 (six) hours as needed for moderate pain.  Marland Kitchen albuterol (PROVENTIL HFA;VENTOLIN HFA) 108 (90 Base) MCG/ACT inhaler Inhale 2 puffs into the lungs every 6 (six) hours as needed for wheezing or shortness of breath. (Patient not taking: Reported on 01/11/2019)  . [DISCONTINUED] pantoprazole (PROTONIX) 40 MG tablet TAKE 1 TABLET 30 MINUTES BEFORE BREAKFAST DAILY   No facility-administered encounter medications on file as of 01/11/2019.     Allergies  Allergen Reactions  . Nsaids Other (See Comments)    HAS ULCERS AND CAN TOLERATE ONLY TYLENOL    ROS:The patient denies anorexia, fever, weight changes, headaches, vision  loss, decreased hearing, ear pain, hoarseness, chest pain, palpitations, dizziness, syncope, dyspnea on exertion, swelling, nausea, vomiting, diarrhea, constipation, abdominal pain, melena, hematochezia, indigestion/heartburn, hematuria, incontinence, erectile dysfunction, nocturia, weakened urine stream, dysuria, genital lesions, joint pains, numbness, tingling, weakness, tremor, suspicious skin lesions, depression, anxiety, abnormal bleeding/bruising. Denies joint pains. His appetite has been up recently, "eating wife out of house and home". Recurrent dry cough x 2 mos per HPI.   PHYSICAL EXAM:  BP 120/86   Pulse 76   Temp 98.4 F (36.9 C) (Other (Comment))   Ht 5' 7" (1.702 m)   Wt 212 lb 3.2 oz (96.3 kg)   BMI 33.24 kg/m    Wt Readings from Last 3 Encounters:  01/20/18 211 lb 6.4 oz (95.9 kg)  01/05/18 214 lb (97.1 kg)  10/26/17 215 lb (97.5 kg)   General Appearance:  Alert, cooperative, no distress, appears stated age.  Head:  Normocephalic, without obvious abnormality, atraumatic  Eyes:  PERRL, conjunctiva/corneas clear, EOM's intact, fundi benign  Ears:  Normal TM's and external ear canals. Some TM scarring noted.  Nose: Nasal mucosa is midly edematous with stringy clear mucus. Sinuses are nontender  Throat: OP is clear, normal mucosa, no lesions.  Neck: Supple, no lymphadenopathy; thyroid: no enlargement/ tenderness/nodules; no carotidbruit or JVD  Back:  Spine nontender, no curvature, ROM normal, no CVA tenderness  Lungs:  Clear to auscultation bilaterally without wheezes, rales or ronchi; respirations unlabored  Chest Wall:  No tenderness or deformity  Heart:  Regular rate and rhythm, S1 and S2 normal, no murmur, rub or gallop  Breast Exam:  No chest wall tenderness, masses or gynecomastia  Abdomen:  Soft, non-tender, nondistended, normoactive bowel sounds, no masses, no hepatosplenomegaly  Genitalia:  Normal male external genitalia  without lesions. Testicles without masses. No inguinal hernias.  Rectal:  Deferred due to age <40 and lack of symptoms  Extremities: No clubbing, cyanosis or edema  Pulses: 2+ and symmetric all extremities  Skin: Skin color, texture, turgor normal, no rashes or lesions. WHSS midline upper chest. Some erythema/irritation at left upper arm (inflamed/scratched related to prior pump placement)  Lymph nodes: Cervical, supraclavicular, and axillary nodes normal  Neurologic: CNII-XII intact, normal strength, sensation and gait; reflexes 2+ and symmetric throughout  Psych: Normal mood, affect, hygiene and grooming  Normal diabetic foot exam   ASSESSMENT/PLAN:  Annual physical exam - Plan: VITAMIN D 25 Hydroxy (Vit-D Deficiency, Fractures), Lipid panel  Need for influenza vaccination - Plan: Flu Vaccine QUAD 6+ mos PF IM (Fluarix Quad PF)  Vitamin D deficiency - recheck today; encouraged him to take additional 1000 IU of D (and if normal now, can add it just during winter months) - Plan: VITAMIN D 25 Hydroxy (Vit-D Deficiency, Fractures)  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  Last check in November had LDL at goal on 1m atorvastatin. Low cholesterol diet reviewed - Plan: Lipid panel  Sarcoidosis - had been in remission. Recent recurrent cough, DDx reviewed, including reflux (since no longer on PPI), PND from allergies, vs sarcoid  Type 1 diabetes mellitus [place] complications [place] - not well controlled per recent labs from Dr. KBuddy Duty  Encouraged compliance with diet, boluses, exercise and weight loss  Other specified hypothyroidism - euthyroid clinically and per last check in January. Managed by Dr. KBuddy Duty Cough - based on exam, PND/allergies may contribute.  GERD also in differential given no longer taking PPI.  Encouraged treatment of both. Will have CXR next month  BMI 33.0-33.9,adult -  counseled re: proper diet, exercise, risks of obesity, weight loss  Skin irritation from adhesive from pump--rec use of 1% HC BID prn. Told to start anithistamine and restart protonix. F/u with Dr. AElsworth Sohoas scheduled next month; declines CXR sooner.   Recommended at least 30 minutes of aerobic activity at least 5 days/week, weight bearing exercise at least 2x/week; proper sunscreen use reviewed; healthy diet and alcohol recommendations (less than or equal to 2 drinks/day) reviewed; regular seatbelt use; changing batteries in smoke detectors. Self-testicular exams. Immunization recommendations discussed--flu shots yearly, given today.  F/u 1 year for CPE, sooner prn Lipids and Vit D today. Other labs done by endo.

## 2019-01-11 ENCOUNTER — Encounter: Payer: Self-pay | Admitting: Family Medicine

## 2019-01-11 ENCOUNTER — Other Ambulatory Visit: Payer: Self-pay

## 2019-01-11 ENCOUNTER — Ambulatory Visit: Payer: 59 | Admitting: Family Medicine

## 2019-01-11 VITALS — BP 120/86 | HR 76 | Temp 98.4°F | Ht 67.0 in | Wt 212.2 lb

## 2019-01-11 DIAGNOSIS — R05 Cough: Secondary | ICD-10-CM

## 2019-01-11 DIAGNOSIS — D869 Sarcoidosis, unspecified: Secondary | ICD-10-CM

## 2019-01-11 DIAGNOSIS — Z23 Encounter for immunization: Secondary | ICD-10-CM | POA: Diagnosis not present

## 2019-01-11 DIAGNOSIS — E038 Other specified hypothyroidism: Secondary | ICD-10-CM

## 2019-01-11 DIAGNOSIS — E109 Type 1 diabetes mellitus without complications: Secondary | ICD-10-CM | POA: Diagnosis not present

## 2019-01-11 DIAGNOSIS — Z6833 Body mass index (BMI) 33.0-33.9, adult: Secondary | ICD-10-CM | POA: Diagnosis not present

## 2019-01-11 DIAGNOSIS — E1069 Type 1 diabetes mellitus with other specified complication: Secondary | ICD-10-CM

## 2019-01-11 DIAGNOSIS — Z0000 Encounter for general adult medical examination without abnormal findings: Secondary | ICD-10-CM

## 2019-01-11 DIAGNOSIS — R059 Cough, unspecified: Secondary | ICD-10-CM

## 2019-01-11 DIAGNOSIS — E782 Mixed hyperlipidemia: Secondary | ICD-10-CM

## 2019-01-11 DIAGNOSIS — E559 Vitamin D deficiency, unspecified: Secondary | ICD-10-CM | POA: Diagnosis not present

## 2019-01-12 LAB — LIPID PANEL
Chol/HDL Ratio: 3.2 ratio (ref 0.0–5.0)
Cholesterol, Total: 187 mg/dL (ref 100–199)
HDL: 59 mg/dL (ref >39)
LDL Chol Calc (NIH): 111 mg/dL — ABNORMAL HIGH (ref 0–99)
Triglycerides: 96 mg/dL (ref 0–149)
VLDL Cholesterol Cal: 17 mg/dL (ref 5–40)

## 2019-01-12 LAB — VITAMIN D 25 HYDROXY (VIT D DEFICIENCY, FRACTURES): Vit D, 25-Hydroxy: 32.6 ng/mL (ref 30.0–100.0)

## 2019-01-22 ENCOUNTER — Encounter: Payer: Self-pay | Admitting: Family Medicine

## 2019-01-22 LAB — HM DIABETES EYE EXAM

## 2019-01-23 ENCOUNTER — Telehealth: Payer: Self-pay

## 2019-01-23 MED ORDER — ATORVASTATIN CALCIUM 80 MG PO TABS: 80.0000 mg | ORAL_TABLET | Freq: Every day | ORAL | 0 refills | Status: DC

## 2019-01-23 NOTE — Telephone Encounter (Signed)
Sent to pharmacy 

## 2019-01-25 NOTE — Progress Notes (Signed)
@Patient  ID: Alejandro Griffin, male    DOB: 10/26/1980, 38 y.o.   MRN: [id number]  Chief Complaint  Patient presents with  . Sarcoidosis    Feels chest tightness more often in last couple months.    Referring provider: Rita Ohara, MD  HPI: 38 year old male, never smoked. PMH hypertension, sarcoidosis, hyperlipidemia, type 2 diabetes, snoring, chronic fatigue. Patient of Dr. Elsworth Soho, last seen on 01/20/18.   Previous [facility] pulmonary encounter: 9/27/18He underwent mediastinoscopy biopsy of 4R lymph node which showed noncaseating granulomas, AFB and fungal stains are negative He was treated with prednisone for 4-5 months and gradually tapered to off by feb He has done well, cough is resolved, no dyspnea. Went to the ED for chest pain 09/2017, we reviewed CT angiogram which shows resolution of infiltrates and mediastinal lymphadenopathy. We reviewed PFTs from the past. No skin lesions, undergoes annual eye exam  01/26/2019 Patient presents today for annual follow-up. Last few months he has been noticing dry cough and more chest tightness, unsure if it is his sarcoidosis or from wearing mask. He has not used Ventolin rescue inhaler in over a year. Never found it to be helpful. Denies fever, weight loss, chest pain or dyspnea. Getting regular eye exam. No new skin lesions. Up to date with flu shot.   Significant tests/ events reviewed >> CT chest 8/28/18which confirmed mediastinal and bilateral hilar lymphadenopathy and also picked up some lymph nodes in the abdomen.There is an area of consolidation in the right lung base and other areas of groundglass opacification.  >> ACE level was 18  >> PFTS11/2018 FEV1 93%, ratio 87, FVC 86%. DLCO 98%.  Allergies  Allergen Reactions  . Nsaids Other (See Comments)    HAS ULCERS AND CAN TOLERATE ONLY TYLENOL    Immunization History  Administered Date(s) Administered  . Influenza,inj,Quad PF,6+ Mos 01/24/2013, 02/28/2014, 01/16/2016,  01/21/2017, 01/05/2018, 01/11/2019  . Pneumococcal Polysaccharide-23 02/25/2017  . Tdap 07/18/2004, 07/19/2014    Past Medical History:  Diagnosis Date  . Cough   . Diabetes mellitus without complication ([place])   . Dyspnea   . Gastritis 2016   confirmed on bx on EGD. Dr. Hilarie Fredrickson  . GERD (gastroesophageal reflux disease)   . Hyperlipidemia   . Hypothyroidism   . Inguinal hernia    RT  . Low testosterone 2016   better on repeat in 2017  . Obesity   . Sarcoidosis   . Vitamin D deficiency     Tobacco History: Social History   Tobacco Use  Smoking Status Never Smoker  Smokeless Tobacco Never Used   Counseling given: Not Answered   Outpatient Medications Prior to Visit  Medication Sig Dispense Refill  . acetaminophen (TYLENOL) 500 MG tablet Take 1,000 mg by mouth every 6 (six) hours as needed for moderate pain.    Marland Kitchen atorvastatin (LIPITOR) 80 MG tablet Take 1 tablet (80 mg total) by mouth daily. 90 tablet 0  . Continuous Blood Gluc Sensor (FREESTYLE LIBRE SENSOR SYSTEM) MISC APPLY ONE SENSOR TO BACK OF UPPER ARM EVERY 14 DAYS  8  . insulin aspart (NOVOLOG) 100 UNIT/ML injection Inject into the skin See admin instructions. Via Omnipod insulin pump, continuously    . Insulin Disposable Pump (OMNIPOD) MISC by Does not apply route.    Marland Kitchen levothyroxine (SYNTHROID) 125 MCG tablet 125 mcg.     . levothyroxine (SYNTHROID, LEVOTHROID) 112 MCG tablet Take 112 mcg by mouth daily before breakfast.     . NON FORMULARY dOTerra  Lifelong Vitality 3-pack: Take 3 tablets from each of the three bottles once a day at bedtime    . albuterol (PROVENTIL HFA;VENTOLIN HFA) 108 (90 Base) MCG/ACT inhaler Inhale 2 puffs into the lungs every 6 (six) hours as needed for wheezing or shortness of breath. (Patient not taking: Reported on 01/26/2019) 18 g 1   No facility-administered medications prior to visit.     Review of Systems  Review of Systems  Constitutional: Negative.   Respiratory: Positive for  cough and chest tightness. Negative for shortness of breath and wheezing.   Cardiovascular: Negative.    Physical Exam  BP 124/72 (BP Location: Left Arm, Patient Position: Sitting, Cuff Size: Normal)   Pulse 87   Temp (!) 97.3 F (36.3 C)   Ht 5\' 7"  (1.702 m)   Wt 216 lb 6.4 oz (98.2 kg)   SpO2 97% Comment: on room air  BMI 33.89 kg/m  Physical Exam Constitutional:      Appearance: Normal appearance.  HENT:     Head: Normocephalic and atraumatic.     Right Ear: Tympanic membrane normal.     Left Ear: Tympanic membrane normal.     Mouth/Throat:     Mouth: Mucous membranes are moist.     Pharynx: Oropharynx is clear.  Cardiovascular:     Rate and Rhythm: Normal rate and regular rhythm.  Pulmonary:     Effort: Pulmonary effort is normal.     Breath sounds: Normal breath sounds. No wheezing.     Comments: CTA Musculoskeletal: Normal range of motion.  Skin:    General: Skin is warm and dry.  Neurological:     General: No focal deficit present.     Mental Status: He is alert and oriented to person, place, and time. Mental status is at baseline.  Psychiatric:        Mood and Affect: Mood normal.        Behavior: Behavior normal.        Thought Content: Thought content normal.        Judgment: Judgment normal.      Lab Results:  CBC    Component Value Date/Time   WBC 12.4 (H) 10/15/2017 1824   RBC 5.45 10/15/2017 1824   HGB 15.5 10/15/2017 1824   HGB 15.2 07/18/2017 1310   HCT 47.6 10/15/2017 1824   HCT 45.4 07/18/2017 1310   PLT 359 10/15/2017 1824   PLT 372 07/18/2017 1310   MCV 87.3 10/15/2017 1824   MCV 87 07/18/2017 1310   MCH 28.4 10/15/2017 1824   MCHC 32.6 10/15/2017 1824   RDW 13.4 10/15/2017 1824   RDW 13.9 07/18/2017 1310   LYMPHSABS 0.5 (L) 07/18/2017 1310   MONOABS 891 12/13/2016 0823   EOSABS 0.0 07/18/2017 1310   BASOSABS 0.0 07/18/2017 1310    BMET    Component Value Date/Time   NA 140 10/15/2017 1824   NA 136 07/18/2017 1310   K 3.9  10/15/2017 1824   CL 106 10/15/2017 1824   CO2 24 10/15/2017 1824   GLUCOSE 104 (H) 10/15/2017 1824   BUN 19 10/15/2017 1824   BUN 18 07/18/2017 1310   CREATININE 1.03 10/15/2017 1824   CREATININE 1.18 12/13/2016 0823   CALCIUM 9.5 10/15/2017 1824   GFRNONAA >60 10/15/2017 1824   GFRAA >60 10/15/2017 1824    BNP No results found for: BNP  ProBNP No results found for: PROBNP  Imaging: Dg Chest 2 View  Result Date: 01/26/2019 CLINICAL DATA:  Sarcoidosis follow-up EXAM: CHEST - 2 VIEW COMPARISON:  November 14, 2017 FINDINGS: The heart size and mediastinal contours are within normal limits. Both lungs are clear. The visualized skeletal structures are unremarkable. IMPRESSION: No active cardiopulmonary disease. Electronically Signed   By: Abelardo Diesel M.D.   On: 01/26/2019 10:45     Assessment & Plan:   Sarcoidosis - Increased chest tightness and dry cough - NO SABA use - CXR showed both lungs clear, no active disease  - Checking ACE level - Consider slow prednisone taper (will discuss with Dr. Elsworth Soho) - Up to date with influenza vaccine - FU in 6 months or sooner if needed   Martyn Ehrich, NP 01/26/2019

## 2019-01-26 ENCOUNTER — Other Ambulatory Visit: Payer: Self-pay

## 2019-01-26 ENCOUNTER — Ambulatory Visit (INDEPENDENT_AMBULATORY_CARE_PROVIDER_SITE_OTHER): Payer: 59

## 2019-01-26 ENCOUNTER — Ambulatory Visit: Payer: 59 | Admitting: Primary Care

## 2019-01-26 ENCOUNTER — Encounter: Payer: Self-pay | Admitting: Primary Care

## 2019-01-26 ENCOUNTER — Ambulatory Visit: Payer: 59 | Admitting: Nurse Practitioner

## 2019-01-26 VITALS — BP 124/72 | HR 87 | Temp 97.3°F | Ht 67.0 in | Wt 216.4 lb

## 2019-01-26 DIAGNOSIS — D869 Sarcoidosis, unspecified: Secondary | ICD-10-CM

## 2019-01-26 NOTE — Patient Instructions (Addendum)
Orders: CXR today Labs (ACE level)  Follow-up: 6 months with Dr. Elsworth Soho   Sarcoidosis  Sarcoidosis is a disease that can cause inflammation in many areas of the body. It most often affects the lungs (pulmonary sarcoidosis). Sarcoidosis can also affect the lymph nodes, liver, eyes, skin, heart, or any other body tissue. Normally, cells that are part of your body's disease-fighting system (immune system) attack harmful substances (such as germs) in your body. This immune system response causes inflammation. After the harmful substance is destroyed, the inflammation and the immune cells go away. When you have sarcoidosis, your immune system causes inflammation even when there are no harmful substances, and the inflammation does not go away. Sarcoidosis also causes cells from your immune system to form small clumps of tissue (granulomas) in the affected area of your body. What are the causes? The exact cause of sarcoidosis is not known.  It is possible that if you have a family history of this disease (genetic predisposition), the immune system response that leads to inflammation may be triggered by something in your environment, such as:  Bacteria or viruses.  Metals.  Chemicals.  Dust.  Mold or mildew. What increases the risk? You may be at a greater risk for sarcoidosis if you:  Have a family history of the disease.  Are African-American.  Are of Northern European descent.  Are 50-27 years old.  Work as a Airline pilot.  Work in an environment where you are exposed to metals, chemicals, mold or mildew, or insecticides. What are the signs or symptoms? Some people with sarcoidosis have no symptoms. Others have very mild symptoms. The symptoms usually depend on the organ that is affected. Sarcoidosis most often affects the lungs, which may include symptoms such as:  Chest pain.  Coughing.  Wheezing.  Shortness of breath. Other common symptoms include:  Night  sweats.  Fever.  Weight loss.  Fatigue.  Swollen lymph nodes.  Joint pain. How is this diagnosed? Sarcoidosis may be diagnosed based on:  Your symptoms and medical history.  A physical exam.  Imaging tests to check for granulomas such as: ? Chest X-ray. ? CT scan. ? MRI. ? PET scan.  Lung function tests. These tests evaluate your breathing and check for problems that may be related to sarcoidosis.  A procedure to remove a tissue sample for testing (biopsy). You may have a biopsy of lung tissue if that is where you are having symptoms. You may have tests to check for any complications of the condition. These tests may include:  Eye exams.  MRI of the heart or brain.  Echocardiogram.  Electrocardiogram (EKG or ECG). How is this treated? In some cases, sarcoidosis does not require a specific treatment because it causes no symptoms or mild symptoms. If your symptoms bother you or are severe, you may be prescribed medicines to reduce inflammation or relieve symptoms. These medicines may include:  Prednisone. This is a steroid that reduces inflammation related to sarcoidosis.  Hydroxychloroquine. This may be used to treat sarcoidosis that affects the skin, eyes, or brain.  Methotrexate, leflunomide, or azathioprine. These medicines affect the immune system and can help with sarcoidosis in the joints, eyes, skin, or lungs.  Medicines that you breathe in (inhalers). Inhalers can help you breathe if sarcoidosis affects your lungs. Follow these instructions at home:   Do not use any products that contain nicotine or tobacco, such as cigarettes and e-cigarettes. If you need help quitting, ask your health care provider.  Avoid secondhand  smoke and irritating dust or chemicals. Stay indoors on days when air quality is poor in your area.  Return to your normal activities as told by your health care provider. Ask your health care provider what activities are safe for  you.  Take or use over-the-counter and prescription medicines only as told by your health care provider.  Keep all follow-up visits as told by your health care provider. This is important. Contact a health care provider if:  You have vision problems.  You have a dry cough that does not go away.  You have an irregular heartbeat.  You have pain or aches in your joints, hands, or feet.  You have an unexplained rash. Get help right away if:  You have chest pain.  You have difficulty breathing. Summary  Sarcoidosis is a disease that can cause inflammation in many body areas of the body. It most often affects the lungs (pulmonary sarcoidosis). It can also affect the lymph nodes, liver, eyes, skin, heart, or any other body tissue.  When you have sarcoidosis, cells from your immune system form small clumps of tissue (granulomas) in the affected area of your body.  Sarcoidosis sometimes does not require a specific treatment because it causes no symptoms or mild symptoms.  If your symptoms bother you or are severe, you may be prescribed medicines to reduce inflammation or relieve symptoms. This information is not intended to replace advice given to you by your health care provider. Make sure you discuss any questions you have with your health care provider. Document Released: 02/04/2004 Document Revised: 03/18/2017 Document Reviewed: 01/11/2017 Elsevier Patient Education  2020 [employer].

## 2019-01-26 NOTE — Assessment & Plan Note (Addendum)
-   Increased chest tightness and dry cough - NO SABA use - CXR showed both lungs clear, no active disease  - Checking ACE level - Consider slow prednisone taper (will discuss with Dr. Elsworth Soho) - Up to date with influenza vaccine - FU in 6 months or sooner if needed

## 2019-01-29 ENCOUNTER — Telehealth: Payer: Self-pay | Admitting: Primary Care

## 2019-01-29 LAB — ANGIOTENSIN CONVERTING ENZYME: Angiotensin-Converting Enzyme: 41 U/L (ref 9–67)

## 2019-01-29 MED ORDER — PREDNISONE 10 MG PO TABS: ORAL_TABLET | ORAL | 0 refills | Status: DC

## 2019-01-29 NOTE — Telephone Encounter (Signed)
Please let patient know CXR showed no active cardiopulmonary disease. ACE level was within normal limits but higher than previous. Discussed with Dr. Elsworth Soho recommended a short course of prednisone d/t previously elevated blood sugars. Sending in RX

## 2019-01-29 NOTE — Telephone Encounter (Signed)
Called spoke with patient, discussed cxr and lab results/recs as stated by [facility] NP.  Patient voiced his understanding and pharmacy was verified as correct.  Patient aware to contact the office with any worsening symptoms.  Nothing further needed at this time; will sign off.

## 2019-01-31 NOTE — Telephone Encounter (Signed)
Received below email from patient.  [name], please advise.  "I was wondering if you were going to post my test results from my ace level. Please let me know if when so that I can look at it. Also I was wondering how long I am going to be on prednisone.     Thanks   Alejandro Griffin"

## 2019-01-31 NOTE — Telephone Encounter (Signed)
I already signed, I tried to release. Ace level was 41. CXR clear. Prednisone is for 2 weeks (once prescription is completed he is done)

## 2019-02-04 ENCOUNTER — Other Ambulatory Visit: Payer: Self-pay | Admitting: Family Medicine

## 2019-02-04 DIAGNOSIS — E1069 Type 1 diabetes mellitus with other specified complication: Secondary | ICD-10-CM

## 2019-02-04 DIAGNOSIS — E782 Mixed hyperlipidemia: Secondary | ICD-10-CM

## 2019-02-16 IMAGING — CR DG CHEST 2V
2 series · 2 of 2 positions shown · non-contrast
Comparison: CT scan chest December 14, 2016 and chest x-ray December 13, 2016

CLINICAL DATA: Preoperative study prior to lymph node biopsy in 2
days. Patient ports cough for the past 2 months. Nonsmoker. History
of diabetes.

EXAM:
CHEST  2 VIEW

[w chest pa]
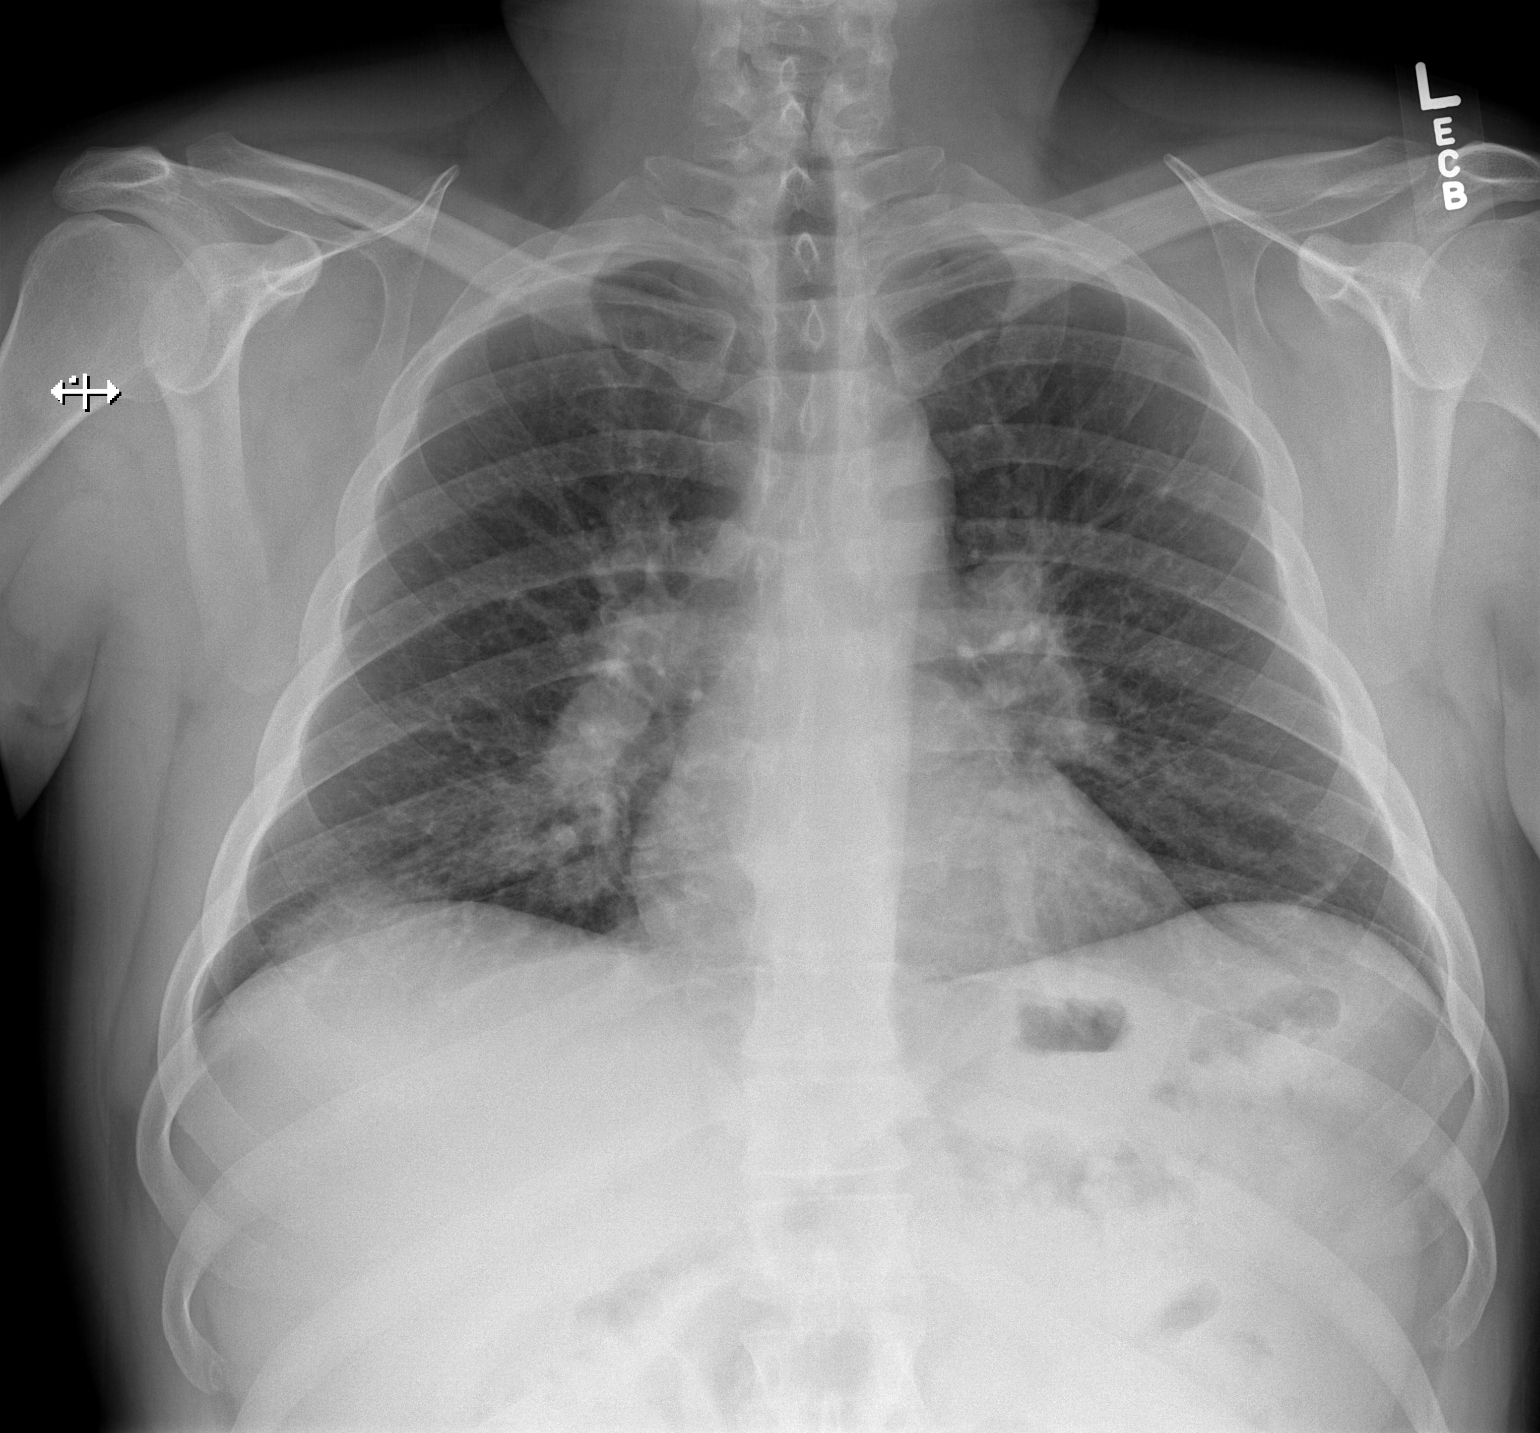

[w chest lat]
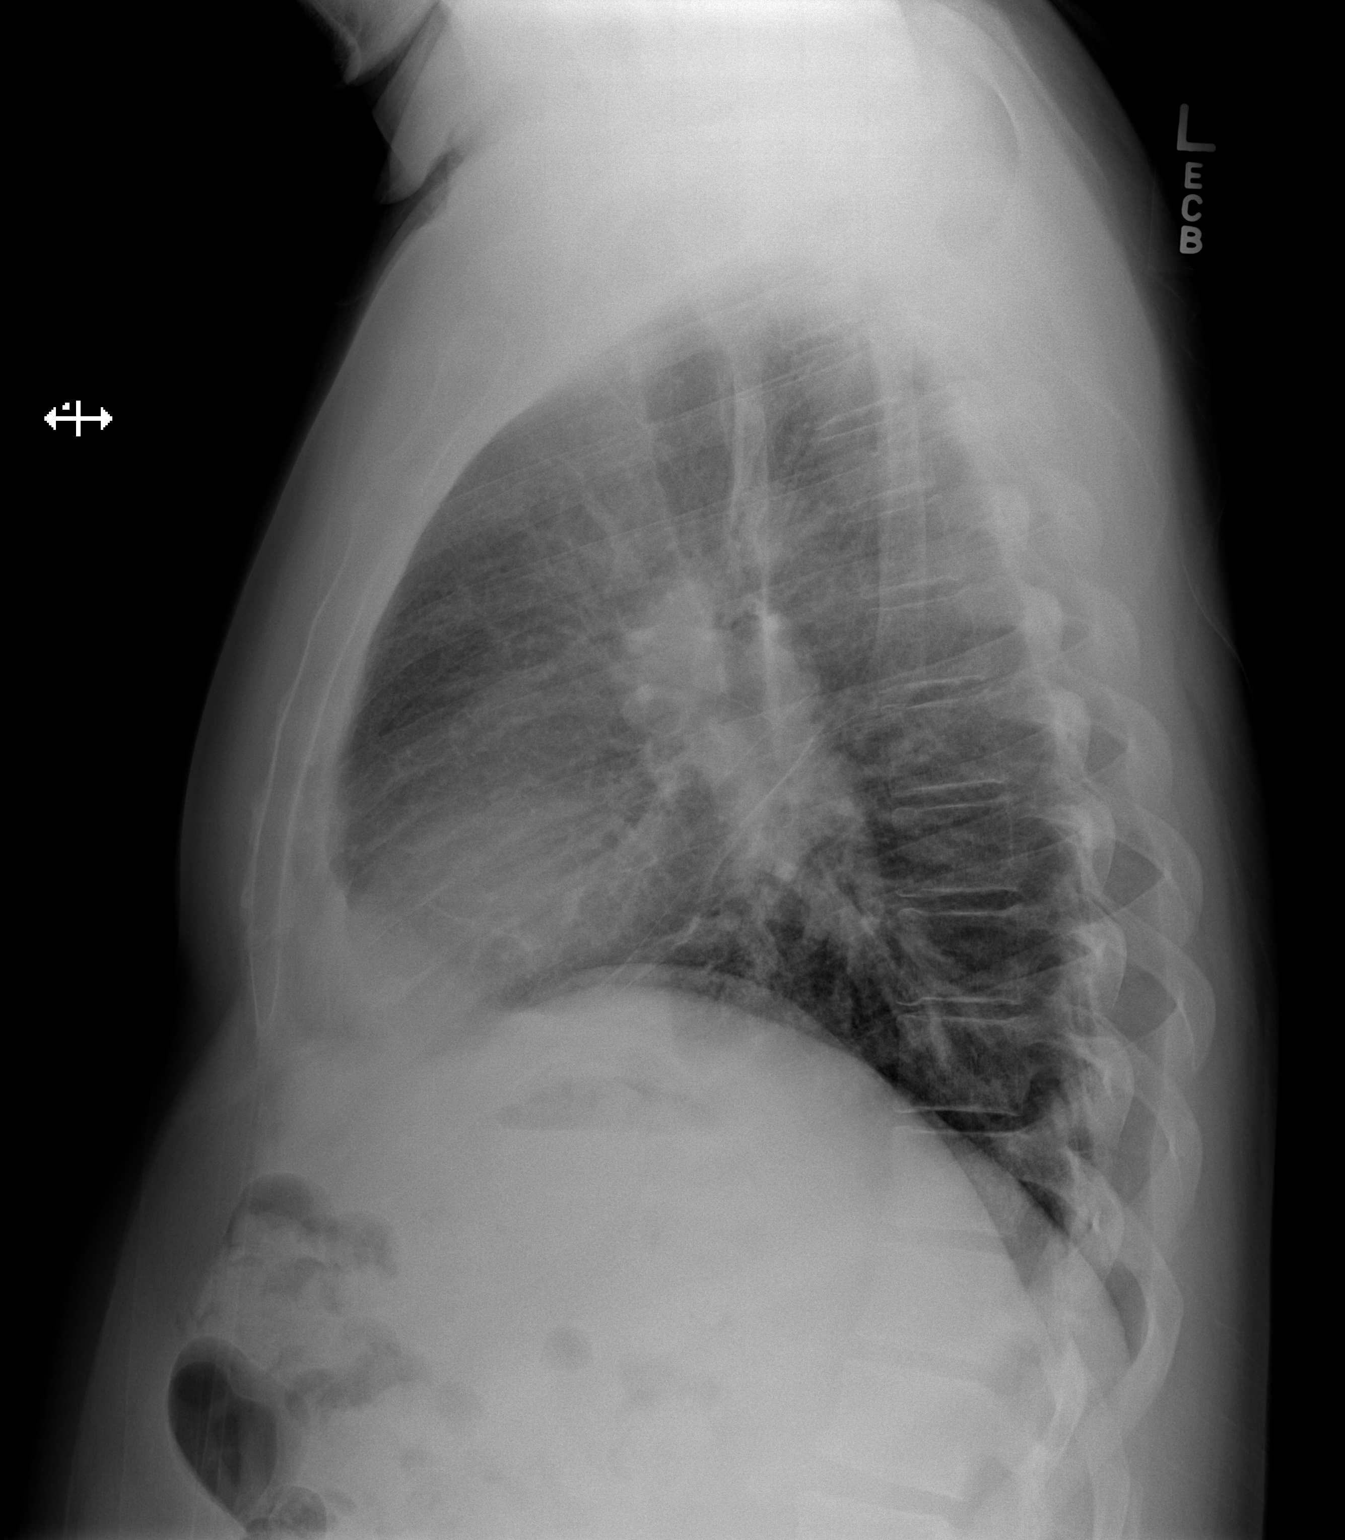

[2 of 2 positions shown; findings below may reference images not displayed]

FINDINGS: The lungs are reasonably well inflated. There is hazy increased
density just above the right hemidiaphragm with partial obscuration
of the hemidiaphragm. This is slightly more conspicuous than on the
previous study. There is bilateral hilar enlargement. The heart and
pulmonary vascularity are normal. There is no pleural effusion. The
bony thorax is unremarkable.
IMPRESSION: Persistent bilateral hilar lymphadenopathy. Parenchymal
consolidation at the right lung base is slightly more conspicuous
today.

## 2019-04-03 ENCOUNTER — Encounter: Payer: Self-pay | Admitting: Family Medicine

## 2019-04-04 ENCOUNTER — Other Ambulatory Visit: Payer: Self-pay

## 2019-04-04 ENCOUNTER — Encounter: Payer: Self-pay | Admitting: Family Medicine

## 2019-04-04 ENCOUNTER — Ambulatory Visit (INDEPENDENT_AMBULATORY_CARE_PROVIDER_SITE_OTHER): Payer: 59 | Admitting: Family Medicine

## 2019-04-04 VITALS — Wt 215.0 lb

## 2019-04-04 DIAGNOSIS — J029 Acute pharyngitis, unspecified: Secondary | ICD-10-CM | POA: Diagnosis not present

## 2019-04-04 DIAGNOSIS — H9203 Otalgia, bilateral: Secondary | ICD-10-CM

## 2019-04-04 DIAGNOSIS — R05 Cough: Secondary | ICD-10-CM | POA: Diagnosis not present

## 2019-04-04 DIAGNOSIS — R059 Cough, unspecified: Secondary | ICD-10-CM

## 2019-04-04 NOTE — Progress Notes (Signed)
Start time: 9:45 End time:10:01  Virtual Visit via Video Note  I connected with Alejandro Griffin on 04/04/19  by a video enabled telemedicine application and verified that I am speaking with the correct person using two identifiers.  Location: Patient: work Provider: office   Switched to telephone call after a few minutes due to poor connection, frequent freezing of the audio, making it difficult to understand the patient.   I discussed the limitations of evaluation and management by telemedicine and the availability of in person appointments. The patient expressed understanding and agreed to proceed.  History of Present Illness: Chief Complaint  Patient presents with  . ear pain    ear pain since last weekend. sore throat started yesterday. cough not sure if its due to sacroid or not but has drainage   4 days ago he started with muffling in the left ear.  Today he has some sore throat. L ear continues to feel muffled, intermittently.  R ear, when he lays down at night he feels like there is fluid moving in it.  Denies any drainage from the ear. No significant discomfort in the ear.  He has chronic cough, usually nonproductive. 2 days ago he started with more of a productive cough--phlegm is yellow-green. Denies any shortness of breath. Denies facial or sinus pain.  Not taking any OTC meds yet.  Was put on prednisone x 2 weeks by pulmonary in October, completed course.  Didn't notice benefit.  PMH, PSH, SH reviewed  Outpatient Encounter Medications as of 04/04/2019  Medication Sig  . atorvastatin (LIPITOR) 80 MG tablet Take 1 tablet (80 mg total) by mouth daily.  . Continuous Blood Gluc Sensor (FREESTYLE LIBRE SENSOR SYSTEM) MISC APPLY ONE SENSOR TO BACK OF UPPER ARM EVERY 14 DAYS  . insulin aspart (NOVOLOG) 100 UNIT/ML injection Inject into the skin See admin instructions. Via Omnipod insulin pump, continuously  . Insulin Disposable Pump (OMNIPOD) MISC by Does not apply  route.  Marland Kitchen levothyroxine (SYNTHROID) 125 MCG tablet 125 mcg.   . levothyroxine (SYNTHROID, LEVOTHROID) 112 MCG tablet Take 112 mcg by mouth daily before breakfast.   . NON FORMULARY dOTerra Lifelong Vitality 3-pack: Take 3 tablets from each of the three bottles once a day at bedtime  . acetaminophen (TYLENOL) 500 MG tablet Take 1,000 mg by mouth every 6 (six) hours as needed for moderate pain.  . [DISCONTINUED] predniSONE (DELTASONE) 10 MG tablet Take 2 tabs x 5 days; then 1 tab x 5 days; then stop   No facility-administered encounter medications on file as of 04/04/2019.   Allergies  Allergen Reactions  . Nsaids Other (See Comments)    HAS ULCERS AND CAN TOLERATE ONLY TYLENOL   ROS: Denies any fever, chills.  No loss of taste/smell. No N/V/D No rashes, bleeding Denies any allergy symptoms, sneezing, itchy or watery eyes. Denies potential COVID exposures.    Observations/Objective:  Wt 215 lb (97.5 kg)   BMI 33.67 kg/m   Patient is at work, wearing goggles and mask. He appears comfortable, in no distress He is speaking easily. No coughing during the visit. He is alert and oriented. Exam is otherwise limited due to virtual nature of the visit.   Assessment and Plan:  Otalgia of both ears - suspect eustachian tube dysfunction.  muffling and fluid is intermittent.  Decongestants recommended  Sore throat - suspect related to PND due to URI.  Decongestant should help. Can use tylenol prn pain  Cough - pt with sarcoid.  cough now is different, productive, suggesting different process. There is no SOB.   Pt advised to get COVID test (and details on how told to pt and put in AVS).  Recommended hydration use of decongestant and expectorant. To f/u if persistent/worsening symptoms. At higher risk for bacterial infection due to DM (as well as higher risk of COVID complications).  Pt understands and agrees.   Follow Up Instructions:    I discussed the assessment and  treatment plan with the patient. The patient was provided an opportunity to ask questions and all were answered. The patient agreed with the plan and demonstrated an understanding of the instructions.   The patient was advised to call back or seek an in-person evaluation if the symptoms worsen or if the condition fails to improve as anticipated.  I provided 16 minutes of non-face-to-face time during this encounter.   Vikki Ports, MD

## 2019-04-04 NOTE — Patient Instructions (Signed)
I recommend that you get COVID testing.  Through [facility], it is now done through appointment.  You can text COVID to [phone] or go to [email or web address].  Please drink plenty of fluid. I recommend you taking decongestants (ie Sudafed) to help with ear pressure, plugging, and any nasal congestion or postnasal drip that may be contributing to sore throat and/or cough.  I recommend using guaifenesin (expectorant found in mucinex and robitussin) to help loosen up any phlegm.  Monitor your temperature.    Please let us know if your symptoms persist or worsen--increasing ear pain, sinus pain, worsening cough, shortness of breath, fever, nausea/vomiting, or any other new symptoms.  Currently this sounds like it could be a viral illness (which therefore includes COVID).  It potentially can be early bacterial, so if symptoms are getting worse over the next 3-5 days, you may need further evaluation (and/or antibiotics, if COVID testing is negative).

## 2019-08-07 ENCOUNTER — Encounter: Payer: Self-pay | Admitting: Family Medicine

## 2019-08-07 ENCOUNTER — Telehealth: Payer: Self-pay

## 2019-08-07 NOTE — Telephone Encounter (Signed)
I sent more info through [place] (his original message was never sent to me). One thing that I'm not sure if it was asked is whether or not he is vaccinated.  Given his underlying lung condition, vaccination is strongly recommended for him.  If he has already been vaccinated, then the guidelines for him are a little different. (no vaccines in chart, which doesn't mean he hasn't been vaccinated, so I just wanted to clarify/confirm).

## 2019-08-07 NOTE — Telephone Encounter (Signed)
Spoke to pt concerning wife recent positive covid results at [place]. Pt was advise to quarantine along with son. Pt was also to advise to keep with in mask and in a different room. Pt and son was advised to have testing at the end of the week due to wife symptoms starting Monday. Pt stated that they already have appt with [facility] for testing. Please advise of any other instruction for pt . Thank you [facility]

## 2019-08-08 NOTE — Telephone Encounter (Signed)
Called patient and he has not had vaccines. I did let him know about the information that you sent him-he said he will read and thanked you.

## 2019-08-17 ENCOUNTER — Telehealth: Payer: Self-pay | Admitting: Unknown Physician Specialty

## 2019-08-17 ENCOUNTER — Encounter: Payer: Self-pay | Admitting: Family Medicine

## 2019-08-17 NOTE — Telephone Encounter (Signed)
Alejandro Griffin, please advise if you will be able to get him scheduled for an infusion. Thanks!

## 2019-08-17 NOTE — Telephone Encounter (Signed)
Called to discuss with Beulah Gandy about Covid symptoms and the use of bamlanivimab, a monoclonal antibody infusion for those with mild to moderate Covid symptoms and at a high risk of hospitalization.     Pt is qualified for this infusion at the [facility] infusion center due to co-morbid conditions and/or a member of an at-risk group, however declines infusion at this time. Symptoms tier reviewed as well as criteria for ending isolation.  Symptoms reviewed that would warrant ED/Hospital evaluation. Preventative practices reviewed. Patient verbalized understanding. Patient would like to think about it and advised to call back if he decides that he does want to get infusion. Callback number to the infusion center given. Patient advised to go to Urgent care or ED with severe symptoms. Last date he would be eligible for infusion is 5/6.    Patient Active Problem List   Diagnosis Date Noted  . Sarcoidosis 12/24/2016  . Type 1 diabetes mellitus without complications ([place]) [id number]  . Maxillary sinusitis 03/26/2016  . Chronic fatigue 03/26/2016  . Daytime somnolence 03/26/2016  . Cephalalgia 11/12/2015  . Sleep concern 11/12/2015  . Snoring 11/12/2015  . Hypertension associated with diabetes ([place]) 04/25/2015  . Hypothyroid 08/26/2014  . Low testosterone 08/26/2014  . Other fatigue 08/26/2014  . Abnormal DTR (deep tendon reflex) 08/26/2014  . Vitamin D deficiency 08/26/2014  . Hyperlipidemia associated with type 2 diabetes mellitus ([place]) 02/28/2014  . Obesity (BMI 30-39.9) 06/27/2013  . Diabetes mellitus, type II, insulin dependent ([place]) 01/21/2012

## 2019-08-17 NOTE — Telephone Encounter (Signed)
Dr. Elsworth Soho, [facility] please advise on patient mychart message:  I just wanted to let you guys know that I tested positive for COVID this morning. My wife and son have had the last ten days and i just tested positive for it. Please advise if I need to do anything special since I have lungs disease already. I would like for you guys to request my medical records from [facility] when you can.   Thanks   Alejandro Griffin

## 2019-08-17 NOTE — Telephone Encounter (Signed)
Called to discuss with patient about Covid symptoms and the use of bamlanivimab, a monoclonal antibody infusion for those with mild to moderate Covid symptoms and at a high risk of hospitalization.  Pt is qualified for this infusion at the [facility] infusion center due to Diabetes   Message left to call back  

## 2019-08-17 NOTE — Telephone Encounter (Signed)
Please refer him for monocloncal antibody infusion He will qualify due to diabetes & sarcoidosis

## 2019-08-17 NOTE — Telephone Encounter (Signed)
Please see above. Monday will still likely be in time frame to be able to get infusion

## 2019-08-18 ENCOUNTER — Telehealth: Payer: Self-pay | Admitting: Unknown Physician Specialty

## 2019-08-18 ENCOUNTER — Encounter: Payer: Self-pay | Admitting: Unknown Physician Specialty

## 2019-08-18 ENCOUNTER — Other Ambulatory Visit: Payer: Self-pay | Admitting: Unknown Physician Specialty

## 2019-08-18 DIAGNOSIS — E119 Type 2 diabetes mellitus without complications: Secondary | ICD-10-CM

## 2019-08-18 DIAGNOSIS — Z794 Long term (current) use of insulin: Secondary | ICD-10-CM

## 2019-08-18 DIAGNOSIS — D869 Sarcoidosis, unspecified: Secondary | ICD-10-CM

## 2019-08-18 MED ORDER — SODIUM CHLORIDE 0.9 % IV SOLN
Freq: Once | INTRAVENOUS | Status: AC
  Filled 2019-08-18: qty 700

## 2019-08-18 NOTE — Telephone Encounter (Signed)
  I connected by phone with Alejandro Griffin on 08/18/2019 at 1:50 PM to discuss the potential use of an new treatment for mild to moderate COVID-19 viral infection in non-hospitalized patients.  This patient is a 39 y.o. male that meets the FDA criteria for Emergency Use Authorization of bamlanivimab/etesevimab or casirivimab/imdevimab.  Has a (+) direct SARS-CoV-2 viral test result  Has mild or moderate COVID-19   Is ? 40 years of age and weighs ? 40 kg  Is NOT hospitalized due to COVID-19  Is NOT requiring oxygen therapy or requiring an increase in baseline oxygen flow rate due to COVID-19  Is within 10 days of symptom onset  Has at least one of the high risk factor(s) for progression to severe COVID-19 and/or hospitalization as defined in EUA.  Specific high risk criteria : Diabetes and sarcoid   I have spoken and communicated the following to the patient or parent/caregiver:  1. FDA has authorized the emergency use of bamlanivimab/etesevimab and casirivimab\imdevimab for the treatment of mild to moderate COVID-19 in adults and pediatric patients with positive results of direct SARS-CoV-2 viral testing who are 72 years of age and older weighing at least 40 kg, and who are at high risk for progressing to severe COVID-19 and/or hospitalization.  2. The significant known and potential risks and benefits of bamlanivimab/etesevimab and casirivimab\imdevimab, and the extent to which such potential risks and benefits are unknown.  3. Information on available alternative treatments and the risks and benefits of those alternatives, including clinical trials.  4. Patients treated with bamlanivimab/etesevimab and casirivimab\imdevimab should continue to self-isolate and use infection control measures (e.g., wear mask, isolate, social distance, avoid sharing personal items, clean and disinfect "high touch" surfaces, and frequent handwashing) according to CDC guidelines.   5. The patient or  parent/caregiver has the option to accept or refuse bamlanivimab/etesevimab or casirivimab\imdevimab .  After reviewing this information with the patient, The patient agreed to proceed with receiving the bamlanimivab infusion and will be provided a copy of the Fact sheet prior to receiving the infusion.   Kathrine Haddock 08/18/2019 1:50 PM

## 2019-08-19 ENCOUNTER — Ambulatory Visit (HOSPITAL_COMMUNITY)
Admission: RE | Admit: 2019-08-19 | Discharge: 2019-08-19 | Disposition: A | Discharge status: Home / Self Care | Payer: 59 | Source: Ambulatory Visit | Attending: Pulmonary Disease | Admitting: Pulmonary Disease

## 2019-08-19 DIAGNOSIS — D869 Sarcoidosis, unspecified: Secondary | ICD-10-CM | POA: Diagnosis present

## 2019-08-19 DIAGNOSIS — E119 Type 2 diabetes mellitus without complications: Secondary | ICD-10-CM | POA: Diagnosis not present

## 2019-08-19 DIAGNOSIS — Z794 Long term (current) use of insulin: Secondary | ICD-10-CM | POA: Insufficient documentation

## 2019-08-19 MED ORDER — EPINEPHRINE 0.3 MG/0.3ML IJ SOAJ: 0.3000 mg | Freq: Once | INTRAMUSCULAR | Status: DC | PRN

## 2019-08-19 MED ORDER — DIPHENHYDRAMINE HCL 50 MG/ML IJ SOLN: 50.0000 mg | Freq: Once | INTRAMUSCULAR | Status: DC | PRN

## 2019-08-19 MED ORDER — METHYLPREDNISOLONE SODIUM SUCC 125 MG IJ SOLR: 125.0000 mg | Freq: Once | INTRAMUSCULAR | Status: DC | PRN

## 2019-08-19 MED ORDER — FAMOTIDINE IN NACL 20-0.9 MG/50ML-% IV SOLN: 20.0000 mg | Freq: Once | INTRAVENOUS | Status: DC | PRN

## 2019-08-19 MED ORDER — ALBUTEROL SULFATE HFA 108 (90 BASE) MCG/ACT IN AERS: 2.0000 | INHALATION_SPRAY | Freq: Once | RESPIRATORY_TRACT | Status: DC | PRN

## 2019-08-19 MED ORDER — SODIUM CHLORIDE 0.9 % IV SOLN: INTRAVENOUS | Status: DC | PRN

## 2019-08-19 NOTE — Progress Notes (Signed)
  Diagnosis: COVID-19  Physician:  Procedure: [facility] Med: bamlanivimab\etesevimab infusion - Provided patient with bamlanimivab\etesevimab fact sheet for patients, parents and caregivers prior to infusion.  Complications: No immediate complications noted.  Discharge: Discharged home   Virgilio Belling 08/19/2019

## 2019-08-20 NOTE — Telephone Encounter (Signed)
Thank you. Appreciate it.

## 2019-08-23 ENCOUNTER — Encounter: Payer: Self-pay | Admitting: Family Medicine

## 2019-09-01 ENCOUNTER — Encounter: Payer: Self-pay | Admitting: Family Medicine

## 2019-10-24 ENCOUNTER — Encounter: Payer: Self-pay | Admitting: Family Medicine

## 2019-10-25 ENCOUNTER — Encounter: Payer: Self-pay | Admitting: Family Medicine

## 2019-10-25 ENCOUNTER — Ambulatory Visit: Payer: No Typology Code available for payment source | Admitting: Family Medicine

## 2019-10-25 ENCOUNTER — Other Ambulatory Visit: Payer: Self-pay

## 2019-10-25 VITALS — BP 106/70 | HR 88 | Ht 67.0 in | Wt 223.0 lb

## 2019-10-25 DIAGNOSIS — M7712 Lateral epicondylitis, left elbow: Secondary | ICD-10-CM

## 2019-10-25 NOTE — Patient Instructions (Signed)
I suspect this is a strain vs tendonitis. No evidence of any rupture or bony injury. Try using a tennis elbow strap on the upper forearm when using your L arm (ie at work, activities, sports). Ice the elbow after activity which causes pain. Avoid repetitive activities with the left arm/elbow if possible, until better. Try using topical medications such as Voltaren gel to help with the pain. If your pain persists or worsens, we can send you to a sports medicine physician.

## 2019-10-25 NOTE — Progress Notes (Signed)
Chief Complaint  Patient presents with  . Elbow Pain    left elbow pain x 3 weeks. Picked up something from the floor up and felt something "pull."    3 weeks ago, lifted something heavy (30# or less) from the ground with his left hand, had acute onset of pain at his L elbow. He felt it pull, possibly pop. Was able to use his R arm to assist and complete the task.  Has had some intermittent ongoing pain at the L elbow, not constant. Not aware of any swelling or significant bruising. Felt pain when he picked something up "wrong" earlier today, prompting him to call for evaluation. Sometimes he will roll over onto it at night and wake up from the pain.  No radiation of the pain into the forearm/hand. No numbness or tingling in the UE.  PMH, PSH, SH reviewed  Outpatient Encounter Medications as of 10/25/2019  Medication Sig  . atorvastatin (LIPITOR) 80 MG tablet Take 1 tablet (80 mg total) by mouth daily.  . Continuous Blood Gluc Sensor (FREESTYLE LIBRE SENSOR SYSTEM) MISC APPLY ONE SENSOR TO BACK OF UPPER ARM EVERY 14 DAYS  . insulin aspart (NOVOLOG) 100 UNIT/ML injection Inject into the skin See admin instructions. Via Omnipod insulin pump, continuously  . Insulin Disposable Pump (OMNIPOD) MISC by Does not apply route.  Marland Kitchen levothyroxine (SYNTHROID) 125 MCG tablet Take 125 mcg by mouth daily before breakfast.   . levothyroxine (SYNTHROID, LEVOTHROID) 112 MCG tablet Take 112 mcg by mouth daily before breakfast.   . NON FORMULARY dOTerra Lifelong Vitality 3-pack: Take 3 tablets from each of the three bottles once a day at bedtime  . acetaminophen (TYLENOL) 500 MG tablet Take 1,000 mg by mouth every 6 (six) hours as needed for moderate pain. (Patient not taking: Reported on 10/25/2019)   No facility-administered encounter medications on file as of 10/25/2019.     Allergies  Allergen Reactions  . Nsaids Other (See Comments)    HAS ULCERS AND CAN TOLERATE ONLY TYLENOL   (has taken advil if  needed for occasional headache).  ROS: no fever, chills, headaches, dizziness, URI symptoms. Cough had cleared up/resolved.  Slight cough just started again recently. Denies chest pain, shortness of breath  PHYSICAL EXAM:  BP 106/70   Pulse 88   Ht 5\' 7"  (1.702 m)   Wt 223 lb (101.2 kg)   BMI 34.93 kg/m   Pleasant, well-appearing male, with occasional dry cough. He is in no distress, speaking easily and comfortably Lungs are clear bilaterally, no wheezes, rales, ronchi. LUE: Tender at L lateral epicondyle Pain with supination against resistance Pain with wrist extension against resistance. No soft tissue swelling, effusion, warmth, bruising. FROM   ASSESSMENT/PLAN:  Lateral epicondylitis, left elbow - vs strain.  Supportive measures--tennis elbow strap, ice after activity, topical Voltaren gel. Avoid lifting, repetitive motions.   I suspect this is a strain vs tendonitis. No evidence of any rupture or bony injury. Try using a tennis elbow strap on the upper forearm when using your L arm (ie at work, activities, sports). Ice the elbow after activity which causes pain. Avoid repetitive activities with the left arm/elbow if possible, until better. Try using topical medications such as Voltaren gel to help with the pain. If your pain persists or worsens, we can send you to a sports medicine physician.

## 2020-01-25 ENCOUNTER — Encounter: Payer: Self-pay | Admitting: Primary Care

## 2020-01-25 ENCOUNTER — Ambulatory Visit: Payer: 59 | Admitting: Pulmonary Disease

## 2020-01-25 ENCOUNTER — Ambulatory Visit: Payer: 59 | Admitting: Primary Care

## 2020-01-25 ENCOUNTER — Other Ambulatory Visit: Payer: Self-pay

## 2020-01-25 VITALS — BP 118/80 | HR 77 | Temp 97.6°F | Ht 67.0 in | Wt 205.4 lb

## 2020-01-25 DIAGNOSIS — Z8616 Personal history of COVID-19: Secondary | ICD-10-CM | POA: Diagnosis not present

## 2020-01-25 DIAGNOSIS — D869 Sarcoidosis, unspecified: Secondary | ICD-10-CM | POA: Diagnosis not present

## 2020-01-25 LAB — SARS-COV-2 IGG: SARS-COV-2 IgG: 6.53

## 2020-01-25 NOTE — Assessment & Plan Note (Addendum)
-   Patient had covid-19 end of April 2021, received monoclonal antibody infusion on 08/19/19 - Patient is unsure if he wants to receive vaccine. We will check his antibodies today. Recommend patient receive complete series of Covid-19 vaccine

## 2020-01-25 NOTE — Patient Instructions (Addendum)
Recommendations: - CXR in 2020 was normal - Due for pneumonia vaccine Nov 2023 (you last received in 2018)  Orders: - Ace level and covid antibodies  Follow-up: - 1 year with Dr. Elsworth Soho or sooner if you develop worsening cough, chest tightness/pain

## 2020-01-25 NOTE — Assessment & Plan Note (Addendum)
-   Appears to be in remission; No new complaints of shortness of breath or chest pain. Rare dry cough. Not currently on prednisone. CXR 01/26/19 showed clear lungs. Checking ACE level.

## 2020-01-25 NOTE — Progress Notes (Addendum)
@Patient  ID: Alejandro Griffin, male    DOB: 06-Feb-1981, 39 y.o.   MRN: [id number]  Chief Complaint  Patient presents with  . Follow-up    Pt states he has been doing okay since last visit and denies any complaints.    Referring provider: Rita Ohara, MD  HPI: 39 year old male, never smoked. PMH hypertension, sarcoidosis, hyperlipidemia, type 2 diabetes, snoring, chronic fatigue. Patient of Dr. Elsworth Soho, last seen on 01/20/18. He had Covid 05 Sep 2019.   Previous [facility] pulmonary encounter: 01/13/17 He underwent mediastinoscopy biopsy of 4R lymph node which showed noncaseating granulomas, AFB and fungal stains are negative He was treated with prednisone for 4-5 months and gradually tapered to off by feb He has done well, cough is resolved, no dyspnea. Went to the ED for chest pain 09/2017, we reviewed CT angiogram which shows resolution of infiltrates and mediastinal lymphadenopathy. We reviewed PFTs from the past. No skin lesions, undergoes annual eye exam  01/26/2019 Patient presents today for annual follow-up. Last few months he has been noticing dry cough and more chest tightness, unsure if it is his sarcoidosis or from wearing mask. He has not used Ventolin rescue inhaler in over a year. Never found it to be helpful. Denies fever, weight loss, chest pain or dyspnea. Getting regular eye exam. No new skin lesions. Up to date with flu shot.    01/25/2020- Interim hx Patient presents today for 1 year follow-up. He is doing well. Patient had covid-19 end of April 2021, received monoclonal infusion. He has a rare dry cough which is baseline for him. Denies shortness of breath, chest tightness or chest pain. He has not required his ventolin resuce inhaler. He will be seeing eye doctor this month or next. No new skin lesions. Not on prednisone. CXR last year was normal.    Significant tests/ events reviewed >> CT chest 8/28/18which confirmed mediastinal and bilateral hilar lymphadenopathy and  also picked up some lymph nodes in the abdomen.There is an area of consolidation in the right lung base and other areas of groundglass opacification.  >> ACE level was 18  >> PFTS11/2018 FEV1 93%, ratio 87, FVC 86%. DLCO 98%.  Allergies  Allergen Reactions  . Nsaids Other (See Comments)    HAS ULCERS AND CAN TOLERATE ONLY TYLENOL    Immunization History  Administered Date(s) Administered  . Influenza,inj,Quad PF,6+ Mos 01/24/2013, 02/28/2014, 01/16/2016, 01/21/2017, 01/05/2018, 01/11/2019  . Pneumococcal Polysaccharide-23 02/25/2017  . Tdap 07/18/2004, 07/19/2014    Past Medical History:  Diagnosis Date  . Cough   . Diabetes mellitus without complication ([place])   . Dyspnea   . Gastritis 2016   confirmed on bx on EGD. Dr. Hilarie Fredrickson  . GERD (gastroesophageal reflux disease)   . Hyperlipidemia   . Hypothyroidism   . Inguinal hernia    RT  . Low testosterone 2016   better on repeat in 2017  . Obesity   . Sarcoidosis   . Vitamin D deficiency     Tobacco History: Social History   Tobacco Use  Smoking Status Never Smoker  Smokeless Tobacco Never Used   Counseling given: Not Answered   Outpatient Medications Prior to Visit  Medication Sig Dispense Refill  . acetaminophen (TYLENOL) 500 MG tablet Take 1,000 mg by mouth every 6 (six) hours as needed for moderate pain.     Marland Kitchen atorvastatin (LIPITOR) 80 MG tablet Take 1 tablet (80 mg total) by mouth daily. 90 tablet 0  . Continuous Blood Gluc  Sensor (FREESTYLE LIBRE SENSOR SYSTEM) MISC APPLY ONE SENSOR TO BACK OF UPPER ARM EVERY 14 DAYS  8  . insulin aspart (NOVOLOG) 100 UNIT/ML injection Inject into the skin See admin instructions. Via Omnipod insulin pump, continuously    . levothyroxine (SYNTHROID) 125 MCG tablet Take 125 mcg by mouth daily before breakfast.     . NON FORMULARY dOTerra Lifelong Vitality 3-pack: Take 3 tablets from each of the three bottles once a day at bedtime    . TRESIBA 100 UNIT/ML SOLN SMARTSIG:25  Unit(s) SUB-Q Daily    . Insulin Disposable Pump (OMNIPOD) MISC by Does not apply route. (Patient not taking: Reported on 01/25/2020)    . levothyroxine (SYNTHROID, LEVOTHROID) 112 MCG tablet Take 112 mcg by mouth daily before breakfast.  (Patient not taking: Reported on 01/25/2020)     No facility-administered medications prior to visit.   Review of Systems  Review of Systems  Constitutional: Negative.   Respiratory: Negative.   Cardiovascular: Negative.   Skin: Negative.    Physical Exam  BP 118/80 (BP Location: Left Arm, Cuff Size: Normal)   Pulse 77   Temp 97.6 F (36.4 C) (Other (Comment)) Comment (Src): wrist  Ht 5\' 7"  (1.702 m)   Wt 205 lb 6.4 oz (93.2 kg)   SpO2 97%   BMI 32.17 kg/m  Physical Exam Constitutional:      Appearance: Normal appearance.  HENT:     Head: Normocephalic and atraumatic.     Mouth/Throat:     Comments: Deferred d/t masking  Cardiovascular:     Rate and Rhythm: Normal rate and regular rhythm.  Pulmonary:     Effort: Pulmonary effort is normal.     Breath sounds: Normal breath sounds.  Skin:    General: Skin is warm and dry.     Findings: No lesion.  Neurological:     General: No focal deficit present.     Mental Status: He is alert and oriented to person, place, and time. Mental status is at baseline.  Psychiatric:        Mood and Affect: Mood normal.        Behavior: Behavior normal.        Thought Content: Thought content normal.      Lab Results:  CBC    Component Value Date/Time   WBC 12.4 (H) 10/15/2017 1824   RBC 5.45 10/15/2017 1824   HGB 15.5 10/15/2017 1824   HGB 15.2 07/18/2017 1310   HCT 47.6 10/15/2017 1824   HCT 45.4 07/18/2017 1310   PLT 359 10/15/2017 1824   PLT 372 07/18/2017 1310   MCV 87.3 10/15/2017 1824   MCV 87 07/18/2017 1310   MCH 28.4 10/15/2017 1824   MCHC 32.6 10/15/2017 1824   RDW 13.4 10/15/2017 1824   RDW 13.9 07/18/2017 1310   LYMPHSABS 0.5 (L) 07/18/2017 1310   MONOABS 891 12/13/2016 0823    EOSABS 0.0 07/18/2017 1310   BASOSABS 0.0 07/18/2017 1310    BMET    Component Value Date/Time   NA 140 10/15/2017 1824   NA 136 07/18/2017 1310   K 3.9 10/15/2017 1824   CL 106 10/15/2017 1824   CO2 24 10/15/2017 1824   GLUCOSE 104 (H) 10/15/2017 1824   BUN 19 10/15/2017 1824   BUN 18 07/18/2017 1310   CREATININE 1.03 10/15/2017 1824   CREATININE 1.18 12/13/2016 0823   CALCIUM 9.5 10/15/2017 1824   GFRNONAA >60 10/15/2017 1824   GFRAA >60 10/15/2017 1824  BNP No results found for: BNP  ProBNP No results found for: PROBNP  Imaging: No results found.   Assessment & Plan:   Sarcoidosis - Appears to be in remission; No new complaints of shortness of breath or chest pain. Rare dry cough. Not currently on prednisone. CXR 01/26/19 showed clear lungs. Checking ACE level.   History of COVID-19 - Patient had covid-19 end of April 2021, received monoclonal antibody infusion on 08/19/19 - Patient is unsure if he wants to receive vaccine. We will check his antibodies today. Recommend patient receive complete series of Covid-19 vaccine    Martyn Ehrich, NP 01/25/2020

## 2020-01-27 LAB — ANGIOTENSIN CONVERTING ENZYME: Angiotensin-Converting Enzyme: 33 U/L (ref 9–67)

## 2020-01-28 LAB — SARS-COV-2 ANTIBODY, IGM: SARS-CoV-2 Antibody, IgM: NEGATIVE

## 2020-01-30 NOTE — Patient Instructions (Addendum)
  HEALTH MAINTENANCE RECOMMENDATIONS:  It is recommended that you get at least 30 minutes of aerobic exercise at least 5 days/week (for weight loss, you may need as much as 60-90 minutes). This can be any activity that gets your heart rate up. This can be divided in 10-15 minute intervals if needed, but try and build up your endurance at least once a week.  Weight bearing exercise is also recommended twice weekly.  Eat a healthy diet with lots of vegetables, fruits and fiber.  "Colorful" foods have a lot of vitamins (ie green vegetables, tomatoes, red peppers, etc).  Limit sweet tea, regular sodas and alcoholic beverages, all of which has a lot of calories and sugar.  Up to 2 alcoholic drinks daily may be beneficial for men (unless trying to lose weight, watch sugars).  Drink a lot of water.  Sunscreen of at least SPF 30 should be used on all sun-exposed parts of the skin when outside between the hours of 10 am and 4 pm (not just when at beach or pool, but even with exercise, golf, tennis, and yard work!)  Use a sunscreen that says "broad spectrum" so it covers both UVA and UVB rays, and make sure to reapply every 1-2 hours.  Remember to change the batteries in your smoke detectors when changing your clock times in the spring and fall.  Use your seat belt every time you are in a car, and please drive safely and not be distracted with cell phones and texting while driving.   Restart atorvastatin 80mg  daily. Return in 2 months for fasting labs.  By then hopefully the thyroid will be in better check.  Goal LDL is under 100. Also be sure to follow a low cholesterol diet.  Start taking vitamin D3 1000 IU daily (at least through March) in addition to your other vitamins. We will recheck this level in 2 months and let you know if you need to continue the 1000 IU for longer.

## 2020-01-30 NOTE — Progress Notes (Signed)
Chief Complaint  Patient presents with  . Annual Exam    fasting annual exam. No new concerns. Will schedule eye appt.  . Immunizations    will take flu shot and would like to discuss covid vaccine with wife.    Alejandro Griffin is a 39 y.o. male who presents for a complete physical.   He unfortunately reports that he was laid off from his job today. He is optimistic that he will find another job soon.  He is on his wife's medical insurance.  He was seen recently with L lateral elbow pain (suspected strain vs tendonitis), where we discussed topical voltaren gel, rest, icing prn, and use of tennis elbow band.  He reports this pain resolved.  Sarcoidosis:  He last saw pulmonary last week. It was felt that he is in remission.  He has a chronic, rare dry cough (his baseline). Denies shortness of breath, chest tightness or chest pain. He has not required any albuterol.  Patient had COVID-19 illness in April 2021, received monoclonal infusion. He was hesitant to get COVID vaccine. Antibodies were checked by pulm, and still present, vaccine still recommended.  Type 1 diabetes: under the care of Alejandro. Alejandro Griffin. We haven't gotten any notes from Alejandro. Alejandro Griffin office since 11/2018.  He brought in with him some records (AVS) from 12/21/19 visit, which had some lab results on it. He used to have an insulin pump, but reports that the pods got too expensive. He ran out in July.  His A1c was 15.5 at 9/3 visit (up from 9.7% from the last results we have, 11/2018).  At this visit last month, he was switched to [place] 25 U daily. PDM for pump calculates his dose of regular insulin for meals. Sugars have improved, but still above goal. Fasting sugars 107 to up to 300 in the mornings.  Once had low of 62, infrequent. He denies polydipsia, polyuria, numbness/tingling/burning in feet. Denies vision changes.  Last diabetic eye exam was 01/2019.  Other labs noted from AVS brought in today: Cr 1.22 Lipid panel--total 283, TG 175,  LDL 184, chol/HDL 4.2 Normal LFTs TSH 130.56 Microalb/Cr ratio <22.6  Hypothyroidism: Managed by Alejandro. Alejandro Griffin. Current dose is 218mcg daily (2 of the 125 tablets; increased from a dose of 237 in September (when TSH was 130.56).  He reports he had missed a month of his thyroid medication prior to his visit.  He denies changes in hair/skin/bowels/energy.   Hyperlipidemia: He is on atorvastatin. Dose was increased to 40mg  daily 2 years ago, LDL was improved on recheck, but was above goal again last year (with only rare missed doses).  Dose was increased to 80mg  last year, but he never returned to have this rechecked. He was given 90d supply, and never contacted [place] for refills, so reports being out of med since Jan/Feb 2021.  Lab Results  Component Value Date   CHOL 187 01/11/2019   HDL 59 01/11/2019   LDLCALC 111 (H) 01/11/2019   TRIG 96 01/11/2019   CHOLHDL 3.2 01/11/2019   Vitamin D deficiency: Last check was 32.6 last year (27.7 the year prior, when on Duterra vitamins without additional D).  He was encouraged to take a separate 1000 IU of D3 over the winter (October through March).  He is currently taking the [facility] but no separate D.  History of gastritis. He used to take pantoprazole chronically.  He stopped it a couple of years ago, with no recurrent heartburn, reflux or indigestion.  Immunization History  Administered Date(s) Administered  . Influenza,inj,Quad PF,6+ Mos 01/24/2013, 02/28/2014, 01/16/2016, 01/21/2017, 01/05/2018, 01/11/2019  . Pneumococcal Polysaccharide-23 02/25/2017  . 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 Walks 5 minute intervals frequently. Heavy lifting at work.   PMH, PSH, SH and FH were reviewed and updated  Outpatient Encounter Medications as of 01/31/2020  Medication Sig Note  . Continuous Blood Gluc Sensor  (FREESTYLE LIBRE SENSOR SYSTEM) MISC APPLY ONE SENSOR TO BACK OF UPPER ARM EVERY 14 DAYS   . levothyroxine (SYNTHROID) 125 MCG tablet Take 250 mcg by mouth daily before breakfast.    . NON FORMULARY dOTerra Lifelong Vitality 3-pack: Take 3 tablets from each of the three bottles once a day at bedtime   . TRESIBA 100 UNIT/ML SOLN SMARTSIG:25 Unit(s) SUB-Q Daily   . acetaminophen (TYLENOL) 500 MG tablet Take 1,000 mg by mouth every 6 (six) hours as needed for moderate pain.  (Patient not taking: Reported on 01/31/2020)   . atorvastatin (LIPITOR) 80 MG tablet Take 1 tablet (80 mg total) by mouth daily. (Patient not taking: Reported on 01/31/2020) 01/31/2020: Been out since Jan/Feb 2021  . insulin aspart (NOVOLOG) 100 UNIT/ML injection Inject into the skin See admin instructions. Via Omnipod insulin pump, continuously (Patient not taking: Reported on 01/31/2020)   . Insulin Disposable Pump (OMNIPOD) MISC by Does not apply route. (Patient not taking: Reported on 01/25/2020)   . [DISCONTINUED] levothyroxine (SYNTHROID, LEVOTHROID) 112 MCG tablet Take 112 mcg by mouth daily before breakfast.  (Patient not taking: Reported on 01/25/2020)    No facility-administered encounter medications on file as of 01/31/2020.   Allergies  Allergen Reactions  . Nsaids Other (See Comments)    HAS ULCERS AND CAN TOLERATE ONLY TYLENOL    ROS:The patient denies anorexia, fever, weight changes, headaches, vision loss, decreased hearing, ear pain, hoarseness, chest pain, palpitations, dizziness, syncope, dyspnea on exertion, swelling, nausea, vomiting, diarrhea, constipation, abdominal pain, melena, hematochezia, indigestion/heartburn, hematuria, incontinence, erectile dysfunction, nocturia, weakened urine stream, dysuria, genital lesions, joint pains, numbness, tingling, weakness, tremor, suspicious skin lesions, depression, anxiety, abnormal bleeding/bruising. Occasional cough He has h/o partial tear and bone spur of R  shoulder--occasional pain (Alejandro Griffin). Denies other joint pains. Rash/dryness at his knuckles on his hands comes and goes.   PHYSICAL EXAM:  BP 120/80   Pulse 72   Ht 5\' 8"  (1.727 m)   Wt 205 lb 9.6 oz (93.3 kg)   BMI 31.26 kg/m   Wt Readings from Last 3 Encounters:  01/25/20 205 lb 6.4 oz (93.2 kg)  10/25/19 223 lb (101.2 kg)  04/04/19 215 lb (97.5 kg)   General Appearance:  Alert, cooperative, no distress, appears stated age.  Head:  Normocephalic, without obvious abnormality, atraumatic  Eyes:  PERRL, conjunctiva/corneas clear, EOM's intact, fundi benign  Ears:  Normal TM's and external ear canals. Some TM scarring noted.  Nose: Not examined, wearing mask due to COVID-19 pandemic  Throat:  Not examined, wearing mask due to COVID-19 pandemic  Neck: Supple, no lymphadenopathy; thyroid: no enlargement/ tenderness/nodules; no carotidbruit or JVD  Back:  Spine nontender, no curvature, ROM normal, no CVA tenderness  Lungs:  Clear to auscultation bilaterally without wheezes, rales or ronchi; respirations unlabored  Chest Wall:  No tenderness or deformity  Heart:  Regular rate and rhythm, S1 and S2 normal, no murmur, rub or gallop  Breast Exam:  No chest wall  tenderness, masses or gynecomastia  Abdomen:  Soft, non-tender, nondistended, normoactive bowel sounds, no masses, no hepatosplenomegaly  Genitalia:  Normal male external genitalia without lesions. Testicles without masses. No inguinal hernias.  Rectal:   Normal sphincter tone, no nodules or masses. Prostate is not enlarged, smooth. Heme negative stool  Extremities: No clubbing, cyanosis or edema. Normal sensation to monofilament. There is mild maceration/erythema between 4th and 5th toes on R. Callous R medial great toe  Pulses: 2+ and symmetric all extremities  Skin: Skin color, texture, turgor normal, no rashes or lesions. WHSS midline upper chest. On 3, 4 5th knuckles (dorsum of  MCP's) bilaterally--calloused/thickened/white. Minimal areas of dryness (not really thickened or plaque) at knees, none at elbows or gluteal cleft.  Lymph nodes: Cervical, supraclavicular, inguinal and axillary nodes normal  Neurologic: Normal strength, sensation and gait; reflexes 2+ and symmetric throughout  Psych: Normal mood, affect, hygiene and grooming  Diabetic foot exam performed (see extremity exam above).   ASSESSMENT/PLAN:  Annual physical exam  Need for influenza vaccination - Plan: Flu Vaccine QUAD 6+ mos PF IM (Fluarix Quad PF)  Need for viral immunization - Plan: Pfizer SARS-COV-2 Vaccine  Pure hypercholesterolemia - noncompliant with statin, plus thyroid affecting lipids. Restart 80mg  atorvastatin. Recheck lipids in 2 mos - Plan: atorvastatin (LIPITOR) 80 MG tablet  Noncompliance with medication regimen - Encouraged to contact his providers when running out of meds or experiencing issues with cost (out of statin and pods for pump recently)  Uncontrolled type 1 diabetes mellitus with hyperglycemia ([place]) - Recently switched to [place] and SSI, in place of pump. A1c 15.5 last month. Under care of Alejandro. Alejandro Griffin  Sarcoidosis - in remission  Vitamin D deficiency - Continue daily supplements.  Due for recheck--will do at next visit when returns for lipids. Encouraged 1000 IU in addition to Doterra vits  Hypothyroidism, unspecified type - noncompliant with meds, so recent TSH very high. Dose adjusted to 277mcg and has f/u sched with endo. No related symptoms  We have no notes since 11/2018 from Alejandro. Alejandro Griffin.  Thankful that patient brought in his visit summary from recent visit, so that I have the lab results today. Will contact to try and get records, hoping they can provide them to [place] more regularly.  He was reminded to schedule his yearly diabetic eye exam (due now).  Recommended at least 30 minutes of aerobic activity at least 5 days/week, weight  bearing exercise at least 2x/week; proper sunscreen use reviewed; healthy diet and alcohol recommendations (less than or equal to 2 drinks/day) reviewed; regular seatbelt use; changing batteries in smoke detectors. Immunization recommendations discussed--flu shots yearly, given today. Willing to get COVID vaccine now, given today. Colonoscopy age 24.  F/u 1 year for CPE, sooner prn Return in 3 weeks for [place] for Alejandro Griffin #2. Return in 2 months for fasting labs (lipid, LFT, Vit D)    Restart atorvastatin 80mg  daily. Return in 2 months for fasting labs.  By then hopefully the thyroid will be in better check.  Goal LDL is under 100. Also be sure to follow a low cholesterol diet.  Start taking vitamin D3 1000 IU daily (at least through March) in addition to your other vitamins. We will recheck this level in 2 months and let you know if you need to continue the 1000 IU for longer.

## 2020-01-31 ENCOUNTER — Encounter: Payer: Self-pay | Admitting: Family Medicine

## 2020-01-31 ENCOUNTER — Other Ambulatory Visit: Payer: Self-pay

## 2020-01-31 ENCOUNTER — Ambulatory Visit: Payer: No Typology Code available for payment source | Admitting: Family Medicine

## 2020-01-31 VITALS — BP 120/80 | HR 72 | Ht 68.0 in | Wt 205.6 lb

## 2020-01-31 DIAGNOSIS — D869 Sarcoidosis, unspecified: Secondary | ICD-10-CM

## 2020-01-31 DIAGNOSIS — Z23 Encounter for immunization: Secondary | ICD-10-CM

## 2020-01-31 DIAGNOSIS — E7800 Pure hypercholesterolemia, unspecified: Secondary | ICD-10-CM

## 2020-01-31 DIAGNOSIS — E1065 Type 1 diabetes mellitus with hyperglycemia: Secondary | ICD-10-CM

## 2020-01-31 DIAGNOSIS — Z9114 Patient's other noncompliance with medication regimen: Secondary | ICD-10-CM

## 2020-01-31 DIAGNOSIS — Z0000 Encounter for general adult medical examination without abnormal findings: Secondary | ICD-10-CM

## 2020-01-31 DIAGNOSIS — E559 Vitamin D deficiency, unspecified: Secondary | ICD-10-CM

## 2020-01-31 DIAGNOSIS — E039 Hypothyroidism, unspecified: Secondary | ICD-10-CM

## 2020-01-31 DIAGNOSIS — Z5181 Encounter for therapeutic drug level monitoring: Secondary | ICD-10-CM

## 2020-01-31 MED ORDER — ATORVASTATIN CALCIUM 80 MG PO TABS: 80.0000 mg | ORAL_TABLET | Freq: Every day | ORAL | 0 refills | Status: DC

## 2020-02-01 ENCOUNTER — Encounter: Payer: Self-pay | Admitting: Family Medicine

## 2020-02-01 DIAGNOSIS — E1065 Type 1 diabetes mellitus with hyperglycemia: Secondary | ICD-10-CM | POA: Insufficient documentation

## 2020-02-11 ENCOUNTER — Encounter: Payer: Self-pay | Admitting: Family Medicine

## 2020-02-12 NOTE — Progress Notes (Signed)
done

## 2020-02-21 ENCOUNTER — Other Ambulatory Visit: Payer: Self-pay

## 2020-02-21 ENCOUNTER — Ambulatory Visit (INDEPENDENT_AMBULATORY_CARE_PROVIDER_SITE_OTHER): Payer: No Typology Code available for payment source

## 2020-02-21 DIAGNOSIS — Z23 Encounter for immunization: Secondary | ICD-10-CM

## 2020-03-31 ENCOUNTER — Encounter: Payer: Self-pay | Admitting: Family Medicine

## 2020-04-01 ENCOUNTER — Other Ambulatory Visit: Payer: No Typology Code available for payment source

## 2020-04-01 ENCOUNTER — Other Ambulatory Visit: Payer: Self-pay

## 2020-04-01 DIAGNOSIS — E559 Vitamin D deficiency, unspecified: Secondary | ICD-10-CM

## 2020-04-01 DIAGNOSIS — E7800 Pure hypercholesterolemia, unspecified: Secondary | ICD-10-CM

## 2020-04-01 DIAGNOSIS — Z5181 Encounter for therapeutic drug level monitoring: Secondary | ICD-10-CM

## 2020-04-02 LAB — LIPID PANEL
Chol/HDL Ratio: 3.5 ratio (ref 0.0–5.0)
Cholesterol, Total: 244 mg/dL — ABNORMAL HIGH (ref 100–199)
HDL: 69 mg/dL (ref >39)
LDL Chol Calc (NIH): 157 mg/dL — ABNORMAL HIGH (ref 0–99)
Triglycerides: 106 mg/dL (ref 0–149)
VLDL Cholesterol Cal: 18 mg/dL (ref 5–40)

## 2020-04-02 LAB — HEPATIC FUNCTION PANEL
ALT: 32 IU/L (ref 0–44)
AST: 27 IU/L (ref 0–40)
Albumin: 4.3 g/dL (ref 4.0–5.0)
Alkaline Phosphatase: 85 IU/L (ref 44–121)
Bilirubin Total: 0.5 mg/dL (ref 0.0–1.2)
Bilirubin, Direct: 0.12 mg/dL (ref 0.00–0.40)
Total Protein: 6.6 g/dL (ref 6.0–8.5)

## 2020-04-02 LAB — VITAMIN D 25 HYDROXY (VIT D DEFICIENCY, FRACTURES): Vit D, 25-Hydroxy: 20.6 ng/mL — ABNORMAL LOW (ref 30.0–100.0)

## 2020-04-24 ENCOUNTER — Encounter: Payer: Self-pay | Admitting: Family Medicine

## 2020-05-05 ENCOUNTER — Telehealth (INDEPENDENT_AMBULATORY_CARE_PROVIDER_SITE_OTHER): Payer: No Typology Code available for payment source | Admitting: Family Medicine

## 2020-05-05 ENCOUNTER — Encounter: Payer: Self-pay | Admitting: Family Medicine

## 2020-05-05 VITALS — Ht 68.0 in | Wt 208.0 lb

## 2020-05-05 DIAGNOSIS — Z9114 Patient's other noncompliance with medication regimen: Secondary | ICD-10-CM

## 2020-05-05 DIAGNOSIS — E559 Vitamin D deficiency, unspecified: Secondary | ICD-10-CM

## 2020-05-05 DIAGNOSIS — E1065 Type 1 diabetes mellitus with hyperglycemia: Secondary | ICD-10-CM | POA: Diagnosis not present

## 2020-05-05 DIAGNOSIS — E039 Hypothyroidism, unspecified: Secondary | ICD-10-CM | POA: Diagnosis not present

## 2020-05-05 DIAGNOSIS — E7800 Pure hypercholesterolemia, unspecified: Secondary | ICD-10-CM | POA: Diagnosis not present

## 2020-05-05 MED ORDER — ATORVASTATIN CALCIUM 80 MG PO TABS: 80.0000 mg | ORAL_TABLET | Freq: Every day | ORAL | 1 refills | Status: DC

## 2020-05-05 MED ORDER — VITAMIN D (ERGOCALCIFEROL) 1.25 MG (50000 UNIT) PO CAPS: 50000.0000 [IU] | ORAL_CAPSULE | ORAL | 0 refills | Status: DC

## 2020-05-05 NOTE — Progress Notes (Signed)
Start time: 12:28 End time: 12:51  Unable to connect to video--patient getting error messages.  Virtual Visit via Telephone Note  I connected with Alejandro Griffin on 05/05/20 by telephone and verified that I am speaking with the correct person using two identifiers.  Location: Patient: work Provider: home office (due to inclement weather)   I discussed the limitations of evaluation and management by telemedicine and the availability of in person appointments. The patient expressed understanding and agreed to proceed.  History of Present Illness:  Chief Complaint  Patient presents with  . Follow-up    VIRTUAL follow up on labs. No concerns.     Patient presents for f/u on December's labs.  Hyperlipidemia: He is on atorvastatin 80mg . When he was here for his physical in October, he reported having run out of medication in Jan/Feb 2021. Labs were therefore not rechecked at that time. He restarted 80mg  lipitor and had labs rechecked in December.  He presents today to discuss these results. On chart review, noticed that LDL was 184 in 12/2019--done by endocrinologist. TSH was high at 130 at that time. He admits to missing all of his medications frequently, see below.  Vitamin D deficiency: Last check was 32.6 last year (27.7 the year prior, when on Duterra vitamins without additional D).  He was encouraged to take a separate 1000 IU of D3 over the winter (October through March).  He has been taking the Duterra vitamins, but no separate D.  Hypothyroidism: under the care of Dr. Buddy Duty.  Last note we received was reviewed, from 12/2019.  At that time, TSH was 130.  His dose was 125 +112 mcg.  He reports dose was further increase to 250 mcg daily, and that recheck in December was 89. He states dose was further increased on recent check, currently taking two 139mcg tablets daily. He admits to missing his medications frequently.  DM--under the care of Dr. Buddy Duty. A1c in September was  15.5  Patient reports noncompliance with his medications--he takes all medications together, or none at all, taking them just once a day. Reports reason for noncompliance has to do with his hours and timing, not for lack of medications or cost. He is currently working 2 jobs, not home in the mornings (used to take all meds at [place], before work).  Hours currently--11:30p-4:45am.  Other job is 7am-3:30p  PMH, [place], [place] reviewed  Outpatient Encounter Medications as of 05/05/2020  Medication Sig Note  . atorvastatin (LIPITOR) 80 MG tablet Take 1 tablet (80 mg total) by mouth daily. 05/05/2020: Admits to missing it frequently  . Continuous Blood Gluc Sensor (FREESTYLE LIBRE SENSOR SYSTEM) MISC APPLY ONE SENSOR TO BACK OF UPPER ARM EVERY 14 DAYS   . insulin aspart (NOVOLOG) 100 UNIT/ML injection Inject into the skin See admin instructions. Via Omnipod insulin pump, continuously   . levothyroxine (SYNTHROID) 137 MCG tablet Take 274 mcg by mouth daily before breakfast. 05/05/2020: Admits to missing it frequently  . NON FORMULARY dOTerra Lifelong Vitality 3-pack: Take 3 tablets from each of the three bottles once a day at bedtime   . TRESIBA 100 UNIT/ML SOLN SMARTSIG:25 Unit(s) SUB-Q Daily   . [DISCONTINUED] levothyroxine (SYNTHROID) 125 MCG tablet Take 250 mcg by mouth daily before breakfast.    . acetaminophen (TYLENOL) 500 MG tablet Take 1,000 mg by mouth every 6 (six) hours as needed for moderate pain.  (Patient not taking: No sig reported)   . Insulin Disposable Pump (OMNIPOD) MISC by Does not apply  route. (Patient not taking: No sig reported)    No facility-administered encounter medications on file as of 05/05/2020.   Allergies  Allergen Reactions  . Nsaids Other (See Comments)    HAS ULCERS AND CAN TOLERATE ONLY TYLENOL   ROS: no fever, chills, URI symptoms. Overall feeling well. Tired, but not sleeping a lot.    Observations/Objective:  Ht 5\' 8"  (1.727 m)   Wt 208 lb (94.3 kg)   BMI 31.63  kg/m   Wt Readings from Last 3 Encounters:  05/05/20 208 lb (94.3 kg)  01/31/20 205 lb 9.6 oz (93.3 kg)  01/25/20 205 lb 6.4 oz (93.2 kg)   He is alert, oriented, in good spirits. Normal speech. Exam is limited due to virtual nature of the visit.   Lab Results  Component Value Date   CHOL 244 (H) 04/01/2020   HDL 69 04/01/2020   LDLCALC 157 (H) 04/01/2020   TRIG 106 04/01/2020   CHOLHDL 3.5 04/01/2020   Lab Results  Component Value Date   ALT 32 04/01/2020   AST 27 04/01/2020   ALKPHOS 85 04/01/2020   BILITOT 0.5 04/01/2020   Vitamin D-OH 20.6  Assessment and Plan:  Vitamin D deficiency - to take rx D x 12 weeks, then start 1000 IU daily, to take along with his statin  Pure hypercholesterolemia - noncompliance with both statin and thyroid medication is contributing.  Risks of high chol reviewed, discussed when to take, use alarms  Hypothyroidism, unspecified type - discussed noncompliance, advising Dr. Buddy Duty of missed meds--likely too much if taken daily now. Better to take at wrong time than skip entirely. Affects lipids  Uncontrolled type 1 diabetes mellitus with hyperglycemia ([place]) - under the care of Dr. Buddy Duty.   Noncompliance with medication regimen - Discussed risks of uncontrolled chol, DM, thyroid, and need to take meds daily. Encouraged use of alarms, discussed timing of meds   Rx D x 12 weeks--[facility] Upon completion 1000 IU of D3 daily Continue Lipitor 80.  No point in adding zetia or changing to crestor--issue is related to med noncompliance, with hypothyroidism possibly contributing.   Follow Up Instructions:    I discussed the assessment and treatment plan with the patient. The patient was provided an opportunity to ask questions and all were answered. The patient agreed with the plan and demonstrated an understanding of the instructions.   The patient was advised to call back or seek an in-person evaluation if the symptoms worsen or if the  condition fails to improve as anticipated.  I provided 26 minutes of video face-to-face time during this encounter.  Additional time was spent in chart review and documentation.   Vikki Ports, MD

## 2020-05-05 NOTE — Patient Instructions (Signed)
Take the prescription vitamin D once weekly for 12 weeks. Once you finish the prescription, it is important that you take an extra vitamin D3 every day, long-term, in order to maintain levels. I recommend 1000 IU taken daily (you can take together with your statin  Medication).  If you miss a dose, you can double up the next day.  Or, if missing frequently, you can get a higher dose (ie 5000 IU) and take it just twice a week, which would be a little more than if you remembered to take the 1000 IU daily.  We aren't changing your cholesterol medication at this time, despite the LDL being above goal.  Missing your medications frequently (both the thyroid and the statin) are contributing factors to the cholesterol being high.  In order to prevent cardiovascular complications, it is important that you try and take your medications every day.  You can use a pill box, if that will help, or set alarms on your phone.  The timing of your thyroid medication is supposed to be on an empty stomach, separate from your vitamins.  I think this is why you sometimes are skipping them.  It is better to take the thyroid medication at the wrong time (if needed, but try not to), than to skip the dose entirely.  It may not get absorbed as well, but will be better than skipping the dose entirely.  Continue to follow up regularly with Dr. Buddy Duty for your control of diabetes and your thyroid.  Be honest with him in how often you are missing your doses. I'm afraid that since your dose has been increased, if you actually do take your medications every day, the dose might be too high.  Keep him in the loop so that he can provide the best recommendations for your health.

## 2020-06-30 ENCOUNTER — Telehealth: Payer: Self-pay | Admitting: Family Medicine

## 2020-06-30 NOTE — Telephone Encounter (Signed)
Friday, while asleep, woke up very dizzy, nauseated, vomiting, felt like he couldn't get up to walk. This afternoon, also had episode of sudden onset of dizziness, vomiting.  Described as possibly a combo of LH and vertigo.  About 2 hours later, occurred again (while in car in driveway; earlier episode was while waiting for tow truck).  Sugar was 93, 112 after second episode.  No fever, chills, headache or other neuro sx.  Described vertigo, and how they can check for positional component (and if present, work hard to avoid that position). Can try meclizine 25mg  every 8 hours, may be sedating, will call out of work tonight.  Advised to f/u in office if sx persist, or go to ER/UC if worsening, especially if other neuro sx (ER). They understood and will try these measures.

## 2020-07-02 ENCOUNTER — Encounter: Payer: Self-pay | Admitting: Family Medicine

## 2020-07-02 ENCOUNTER — Other Ambulatory Visit: Payer: Self-pay

## 2020-07-02 ENCOUNTER — Ambulatory Visit: Payer: No Typology Code available for payment source | Admitting: Family Medicine

## 2020-07-02 VITALS — BP 110/78 | HR 80 | Ht 68.0 in | Wt 203.4 lb

## 2020-07-02 DIAGNOSIS — E559 Vitamin D deficiency, unspecified: Secondary | ICD-10-CM | POA: Diagnosis not present

## 2020-07-02 DIAGNOSIS — R42 Dizziness and giddiness: Secondary | ICD-10-CM

## 2020-07-02 DIAGNOSIS — E1065 Type 1 diabetes mellitus with hyperglycemia: Secondary | ICD-10-CM

## 2020-07-02 NOTE — Progress Notes (Signed)
Chief Complaint  Patient presents with  . Dizziness    Started Friday night. Yesterday when he was reading an Ipad and turned his head from left to right he got dizzy, also states it can be when he is just sitting. Feels like he is drunk, room spins and he staggers.     Patient presents with complaint of vertigo.  See phone message from 3/14 with details of symptoms, which started Friday.  Had another episode 3/14, dizziness, vomiting, occurred again 2 hours later.  Sugars were fine at that time. It was recommended he try meclizine  Yesterday, while sitting in work [employer], looking at tablet and phone, he learned to the right to pick something up off the floor, and when he sat back up, vertigo started. Started at 2:30, and felt bad off and on until 7pm.  He took 1/2 meclizine tablet at 2:30--didn't make him sleepy, but didn't take extra half, worried bc driving work vehicle.  He took another 1/2 tablet at midnight last night (more preventatively, had been feeling okay). No vomiting with yesterday's episode. His boss had to drive him home yesterday.  Yest sugar was high (360's) when he felt bad, had been fine with the other 2 episodes on Tuesday.  No fever, chills, URI smptoms.  He has mild stuffy nose, slight allergies.  Not bad enough to take anything yet for hit.  No sneezing, itchy eyes. Some intermittent plugging of the left ear. No diarrhea.  N/V related to vertigo only. No bleeding, bruising, rashes. No numbness, tingling, headaches, weakness or other neuro symptoms.  PMH, PSH, SH reviewed  Outpatient Encounter Medications as of 07/02/2020  Medication Sig Note  . atorvastatin (LIPITOR) 80 MG tablet Take 1 tablet (80 mg total) by mouth daily.   . Continuous Blood Gluc Sensor (FREESTYLE LIBRE SENSOR SYSTEM) MISC APPLY ONE SENSOR TO BACK OF UPPER ARM EVERY 14 DAYS   . insulin aspart (NOVOLOG) 100 UNIT/ML injection Inject into the skin See admin instructions. Via Omnipod insulin pump,  continuously   . Insulin Disposable Pump (OMNIPOD) MISC by Does not apply route.   Marland Kitchen levothyroxine (SYNTHROID) 137 MCG tablet Take 274 mcg by mouth daily before breakfast. 07/02/2020: He is taking 140mcg tablets, two tablets (250 mcg dose) once daily  . meclizine (ANTIVERT) 25 MG tablet Take 12.5-25 mg by mouth 3 (three) times daily as needed for dizziness or nausea. 07/02/2020: Taking 1/2 tablet (12.5mg ); last dose at midnight last night  . NON FORMULARY dOTerra Lifelong Vitality 3-pack: Take 3 tablets from each of the three bottles once a day at bedtime   . acetaminophen (TYLENOL) 500 MG tablet Take 1,000 mg by mouth every 6 (six) hours as needed for moderate pain.  (Patient not taking: No sig reported)   . TRESIBA 100 UNIT/ML SOLN SMARTSIG:25 Unit(s) SUB-Q Daily (Patient not taking: Reported on 07/02/2020) 07/02/2020: Prn if pump malfunctions  . Vitamin D, Ergocalciferol, (DRISDOL) 1.25 MG (50000 UNIT) CAPS capsule Take 1 capsule (50,000 Units total) by mouth every 7 (seven) days. (Patient not taking: Reported on 07/02/2020) 07/02/2020: Took one dose and misplaced them   No facility-administered encounter medications on file as of 07/02/2020.    Allergies  Allergen Reactions  . Nsaids Other (See Comments)    HAS ULCERS AND CAN TOLERATE ONLY TYLENOL   ROS: no fever, chills.  Nasal congestion and vertigo, with associated vomiting per HPI. No diarrhea, no sinus pain, cough, chest pain, shortness of breath. No numbness, tingling, weakness, changes in  thinking/memory or other neuro issues. No bleeding, rashes. See HPI   PHYSICAL EXAM:  BP 110/78   Pulse 80   Ht 5\' 8"  (1.727 m)   Wt 203 lb 6.4 oz (92.3 kg)   BMI 30.93 kg/m   Well-appearing male, in good spirits, in no distress HEENT: conjunctiva and sclera are clear, EOMI, fundi benign. TM--R is scarred (white).  L is normal. Partially obscured by cerumen on the left, visualized portion is normal.  Nasal mucosa--mildly edematous with small  amount of yellow mucus present bilaterally.  Sinuses are nontender. OP is clear Neck: no lymphadenopathy, thyromegaly or carotid bruit Heart: regular rate and rhythm, no murmur Lungs: clear bilaterally Neuro: alert and oriented. Cranial nerves intact. Normal strength, sensation, DTR's. Unable to elicit any vertigo with position changes. Psych: normal mood, affect, hygiene and grooming    ASSESSMENT/PLAN:  Vertigo - no e/o positional component on today's exam. Ddx reviewed.  Supportive measures, meclizine prn. Treat nasal sx. Consider ENT if persists  Vitamin D deficiency - noncompliant with rx--reminded to restart (will look for bottle)  Uncontrolled type 1 diabetes mellitus with hyperglycemia ([place]) - sugar high yesterday (during episode), good with other episodes. Under care of endo    Intermittent vertigo. Could not demonstrate and positional component in office today, therefore cannot dx BPPV and refer for PT. Given appearance of nasal exam, possible contribution of either allergies or a virus. Treat with supportive measures--  Needs to find the prescription vitamin D and start taking them again.   Drink plenty of water. Be sure not to skip meals and keep an eye on your sugars. Use saline spray in the nose. Use decongestants as needed (ie sudafed) for nasal congestion, and/or consider antihistamines such as claritin, allegra or zyrtec, once daily to treat allergies.  You may continue the meclizine as needed for vertigo.  Since it doesn't make you sleepy, use the full 25mg  when you have symptoms.  You can use it up to three times daily, if needed. If you develop any new neurologic symptoms, please let us know--bad headaches, weakness, numbness, tingling, trouble thinking, feeling lethargic  If symptoms persist (not really worsening, but not resolving) we may need to send you to an ENT for further evaluation.  Remember to look for your prescription vitamin D and start taking it  once weekly.

## 2020-07-02 NOTE — Patient Instructions (Signed)
Drink plenty of water. Be sure not to skip meals and keep an eye on your sugars. Use saline spray in the nose. Use decongestants as needed (ie sudafed) for nasal congestion, and/or consider antihistamines such as claritin, allegra or zyrtec, once daily to treat allergies.  You may continue the meclizine as needed for vertigo.  Since it doesn't make you sleepy, use the full 25mg  when you have symptoms.  You can use it up to three times daily, if needed. If you develop any new neurologic symptoms, please let us know--bad headaches, weakness, numbness, tingling, trouble thinking, feeling lethargic  If you do find that there is a positional component (trigger) that starts your vertigo, we can send you to physical therapy for treatment.  Today there was no evidence of any positional component--couldn't trigger the vertigo at all.  If symptoms persist (not really worsening, but not resolving) we may need to send you to an ENT for further evaluation.  Remember to look for your prescription vitamin D and start taking it once weekly.    Vertigo Vertigo is the feeling that you or your surroundings are moving when they are not. This feeling can come and go at any time. Vertigo often goes away on its own. Vertigo can be dangerous if it occurs while you are doing something that could endanger you or others, such as driving or operating machinery. Your health care provider will do tests to determine the cause of your vertigo. Tests will also help your health care provider decide how best to treat your condition. Follow these instructions at home: Eating and drinking  Drink enough fluid to keep your urine pale yellow.  Do not drink alcohol.      Activity  Return to your normal activities as told by your health care provider. Ask your health care provider what activities are safe for you.  In the morning, first sit up on the side of the bed. When you feel okay, stand slowly while you hold onto  something until you know that your balance is fine.  Move slowly. Avoid sudden body or head movements or certain positions, as told by your health care provider.  If you have trouble walking or keeping your balance, try using a cane for stability. If you feel dizzy or unstable, sit down right away.  Avoid doing any tasks that would cause danger to you or others if vertigo occurs.  Avoid bending down if you feel dizzy. Place items in your home so that they are easy for you to reach without leaning over.  Do not drive or use heavy machinery if you feel dizzy. General instructions  Take over-the-counter and prescription medicines only as told by your health care provider.  Keep all follow-up visits as told by your health care provider. This is important. Contact a health care provider if:  Your medicines do not relieve your vertigo or they make it worse.  You have a fever.  Your condition gets worse or you develop new symptoms.  Your family or friends notice any behavioral changes.  Your nausea or vomiting gets worse.  You have numbness or a prickling and tingling sensation in part of your body. Get help right away if you:  Have difficulty moving or speaking.  Are always dizzy.  Faint.  Develop severe headaches.  Have weakness in your hands, arms, or legs.  Have changes in your hearing or vision.  Develop a stiff neck.  Develop sensitivity to light. Summary  Vertigo is  the feeling that you or your surroundings are moving when they are not.  Your health care provider will do tests to determine the cause of your vertigo.  Follow instructions for home care. You may be told to avoid certain tasks, positions, or movements.  Contact a health care provider if your medicines do not relieve your symptoms, or if you have a fever, nausea, vomiting, or changes in behavior.  Get help right away if you have severe headaches or difficulty speaking, or you develop hearing or  vision problems. This information is not intended to replace advice given to you by your health care provider. Make sure you discuss any questions you have with your health care provider. Document Revised: 02/27/2018 Document Reviewed: 02/27/2018 Elsevier Patient Education  2021 [employer].

## 2020-07-04 ENCOUNTER — Encounter: Payer: Self-pay | Admitting: Family Medicine

## 2020-07-07 ENCOUNTER — Encounter: Payer: Self-pay | Admitting: Family Medicine

## 2020-07-07 ENCOUNTER — Ambulatory Visit: Payer: No Typology Code available for payment source | Admitting: Family Medicine

## 2020-07-07 ENCOUNTER — Other Ambulatory Visit: Payer: Self-pay

## 2020-07-07 VITALS — BP 110/70 | HR 84 | Ht 68.0 in | Wt 204.4 lb

## 2020-07-07 DIAGNOSIS — R000 Tachycardia, unspecified: Secondary | ICD-10-CM | POA: Diagnosis not present

## 2020-07-07 DIAGNOSIS — R42 Dizziness and giddiness: Secondary | ICD-10-CM

## 2020-07-07 DIAGNOSIS — J310 Chronic rhinitis: Secondary | ICD-10-CM

## 2020-07-07 DIAGNOSIS — E1065 Type 1 diabetes mellitus with hyperglycemia: Secondary | ICD-10-CM | POA: Diagnosis not present

## 2020-07-07 MED ORDER — AMOXICILLIN 875 MG PO TABS: 875.0000 mg | ORAL_TABLET | Freq: Two times a day (BID) | ORAL | 0 refills | Status: DC

## 2020-07-07 NOTE — Patient Instructions (Signed)
Stay well hydrated. Take your thyroid medications as directed. Follow up with Dr. Buddy Duty as planned next week--if thyroid is over-replaced, this can contribute to issues with your heart rate and palpitations. We are treating you with antibiotics for the discolored mucus noted--possibly there is a bacterial infection (sinus) affecting your blood sugars and possibly contributing to the dizziness. If dizziness persists, we will need to send you to an ENT for further evaluation.  Pay attention to further issues with your heart--jot down notes (how long it lasted, what you were doing, any triggers, and check the actual pulse and record).

## 2020-07-07 NOTE — Telephone Encounter (Signed)
Letter typed and left message for pt

## 2020-07-07 NOTE — Progress Notes (Signed)
Chief Complaint  Patient presents with  . Dizziness    Still having dizziness and having some heart racing.    Patient presents with ongoing episodes of vertigo.  He had an episode Friday and one on Sunday (2 since last visit last week).  Friday--stepped up to get back into the [place], on lunch break.  Lasted a couple of hours--felt like head was spinning.  +nausea, no vomiting.  Took 1/2 tablet of meclizine at the onset of vertigo (no additional doses, no SE). Coworker brought him back to office, dad took him home and slept until 5pm. Felt fine later that night, was able to work at [employer], and fine all day Saturday. Sunday morning, 9:30 while laying in bed, rolled onto his left side and vertigo recurred.  He noticed his heart racing when this occurred.  Hasn't had any palpitations or tachycardia with other episodes. He reports the tachycardia lasted at most 10 minutes, stopped abruptly. Once it resolved, he went back to sleep and stayed in bed until 1:30-2pm.  Wife was at church, so he never got out of bed to take any meclizine. No associated chest pressure or shortness of breath with the tachycardia.  Patient is under the care of Dr. Buddy Duty for his DM and hypothyroidism, has had some noncompliance.  Today he shows me most recent lab results from Dr. Buddy Duty (from his phone):  TSH on  06/27/20 was 5.94; free T4 "high" per pt's phone, no value given. FT3 normal.  (prior TSH was >100, hadn't been taking meds then). He states that Dr. Buddy Duty asked him to increase to 237 (112 + 125). He ran out of 112, and started taking 250 total on 3/10 (just prior to labs). Has an appointment next week With Dr. Buddy Duty. Didn't take any thyroid medication yesterday at all.  States his sugars have been running high. Last A1c was 15.5 in 12/2019.  States didn't have A1c done at last visit (in January). Asked if he has contacted Dr. Buddy Duty to let him know that sugars have been high.  Patient reported that Dr. Buddy Duty "could see" all his  values.  He has not spoken with him about values, has appt next week.  PMH, PSH, SH reviewed  Outpatient Encounter Medications as of 07/07/2020  Medication Sig Note  . atorvastatin (LIPITOR) 80 MG tablet Take 1 tablet (80 mg total) by mouth daily.   . Continuous Blood Gluc Sensor (FREESTYLE LIBRE SENSOR SYSTEM) MISC APPLY ONE SENSOR TO BACK OF UPPER ARM EVERY 14 DAYS   . insulin aspart (NOVOLOG) 100 UNIT/ML injection Inject into the skin See admin instructions. Via Omnipod insulin pump, continuously   . Insulin Disposable Pump (OMNIPOD) MISC by Does not apply route.   Marland Kitchen levothyroxine (SYNTHROID) 125 MCG tablet Take 250 mcg by mouth daily before breakfast.   . NON FORMULARY dOTerra Lifelong Vitality 3-pack: Take 3 tablets from each of the three bottles once a day at bedtime   . [DISCONTINUED] levothyroxine (SYNTHROID) 137 MCG tablet Take 274 mcg by mouth daily before breakfast. 07/02/2020: He is taking 171mg tablets, two tablets (250 mcg dose) once daily  . acetaminophen (TYLENOL) 500 MG tablet Take 1,000 mg by mouth every 6 (six) hours as needed for moderate pain.  (Patient not taking: No sig reported)   . meclizine (ANTIVERT) 25 MG tablet Take 12.5-25 mg by mouth 3 (three) times daily as needed for dizziness or nausea. (Patient not taking: Reported on 07/07/2020) 07/02/2020: Taking 1/2 tablet (12.511m; last dose at  midnight last night  . TRESIBA 100 UNIT/ML SOLN SMARTSIG:25 Unit(s) SUB-Q Daily (Patient not taking: Reported on 07/07/2020) 07/02/2020: Prn if pump malfunctions  . Vitamin D, Ergocalciferol, (DRISDOL) 1.25 MG (50000 UNIT) CAPS capsule Take 1 capsule (50,000 Units total) by mouth every 7 (seven) days. (Patient not taking: No sig reported) 07/02/2020: Took one dose and misplaced them   No facility-administered encounter medications on file as of 07/07/2020.   Allergies  Allergen Reactions  . Nsaids Other (See Comments)    HAS ULCERS AND CAN TOLERATE ONLY TYLENOL   ROS: no fever, chills,  URI symptoms, headaches, dizziness, chest pain, shortness of breath, numbness, tingling, polydipsia, rashes. +vertigo and tachycardia per HPI.  Normal energy, no weight changes.   PHYSICAL EXAM:  BP 110/70   Pulse 84   Ht _0  (1.727 m)   Wt 204 lb 6.4 oz (92.7 kg)   BMI 31.08 kg/m   Wt Readings from Last 3 Encounters:  07/07/20 204 lb 6.4 oz (92.7 kg)  07/02/20 203 lb 6.4 oz (92.3 kg)  05/05/20 208 lb (94.3 kg)   HEENT: conjunctiva and sclera area clear, EOMI.  Scarring on R TM. L TM is partially occluded/obscured by cerumen--visualized portion normal (medial portion) Nasal mucosa--mild-mod edematous with clear mucus on R, light yellow on the left. Sinuses nontender OP clear Neck: no lymphadenopathy, thyromegaly or mass Heart: regular rate and rhythm.  No murmur, rub, gallop or ectopy. Lungs: clear bilatearlly Neuro: alert and oriented, cranial nerves 2-12 intact. Normal strength, gait No vertigo elicited with positional changes  299 glu today (pt check with his Freestyle monitor).   EKG NSR, poor R wave progression. No acute changes.   ASSESSMENT/PLAN:  Tachycardia - Abrupt cessation suggests poss pSVT vs other arrhythmia. Unclear if thyroid issues contribute. Consider cardiac eval given risk factors - Plan: CBC with Differential/Platelet, Comprehensive metabolic panel, EKG [id number]  Uncontrolled type 1 diabetes mellitus with hyperglycemia ([place]) - Plan: Comprehensive metabolic panel  Vertigo - episodic, unclear trigger; not able to reproduce in office. Check labs. cont meclizine prn. No driving/working when flares - Plan: EKG 12-Lead  Purulent rhinitis - Given elevated sugars, purulence noted on nasal exam and vertigo, will treat with course of ABX. May need ENT referral if vertigo persists - Plan: amoxicillin (AMOXIL) 875 MG tablet   CBC, c-met (pt fasting today)   Stay well hydrated. Take your thyroid medications as directed. Follow up with Dr. Buddy Duty as planned  next week--if thyroid is over-replaced, this can contribute to issues with your heart rate and palpitations. We are treating you with antibiotics for the discolored mucus noted--possibly there is a bacterial infection (sinus) affecting your blood sugars and possibly contributing to the dizziness. If dizziness persists, we will need to send you to an ENT for further evaluation.  Pay attention to further issues with your heart--jot down notes (how long it lasted, what you were doing, any triggers, and check the actual pulse and record).

## 2020-07-08 LAB — COMPREHENSIVE METABOLIC PANEL
ALT: 33 IU/L (ref 0–44)
AST: 20 IU/L (ref 0–40)
Albumin/Globulin Ratio: 1.6 (ref 1.2–2.2)
Albumin: 4.2 g/dL (ref 4.0–5.0)
Alkaline Phosphatase: 77 IU/L (ref 44–121)
BUN/Creatinine Ratio: 15 (ref 9–20)
BUN: 16 mg/dL (ref 6–24)
Bilirubin Total: 0.4 mg/dL (ref 0.0–1.2)
CO2: 23 mmol/L (ref 20–29)
Calcium: 9.2 mg/dL (ref 8.7–10.2)
Chloride: 100 mmol/L (ref 96–106)
Creatinine, Ser: 1.06 mg/dL (ref 0.76–1.27)
Globulin, Total: 2.7 g/dL (ref 1.5–4.5)
Glucose: 303 mg/dL — ABNORMAL HIGH (ref 65–99)
Potassium: 4.9 mmol/L (ref 3.5–5.2)
Sodium: 137 mmol/L (ref 134–144)
Total Protein: 6.9 g/dL (ref 6.0–8.5)
eGFR: 90 mL/min/{1.73_m2} (ref >59)

## 2020-07-08 LAB — CBC WITH DIFFERENTIAL/PLATELET
Basophils Absolute: 0 10*3/uL (ref 0.0–0.2)
Basos: 1 %
EOS (ABSOLUTE): 0.2 10*3/uL (ref 0.0–0.4)
Eos: 3 %
Hematocrit: 44.8 % (ref 37.5–51.0)
Hemoglobin: 14.9 g/dL (ref 13.0–17.7)
Immature Grans (Abs): 0 10*3/uL (ref 0.0–0.1)
Immature Granulocytes: 0 %
Lymphocytes Absolute: 2.1 10*3/uL (ref 0.7–3.1)
Lymphs: 32 %
MCH: 30.6 pg (ref 26.6–33.0)
MCHC: 33.3 g/dL (ref 31.5–35.7)
MCV: 92 fL (ref 79–97)
Monocytes Absolute: 0.6 10*3/uL (ref 0.1–0.9)
Monocytes: 9 %
Neutrophils Absolute: 3.7 10*3/uL (ref 1.4–7.0)
Neutrophils: 55 %
Platelets: 353 10*3/uL (ref 150–450)
RBC: 4.87 x10E6/uL (ref 4.14–5.80)
RDW: 12 % (ref 11.6–15.4)
WBC: 6.6 10*3/uL (ref 3.4–10.8)

## 2020-07-09 ENCOUNTER — Encounter: Payer: Self-pay | Admitting: Family Medicine

## 2020-07-10 ENCOUNTER — Encounter: Payer: Self-pay | Admitting: Family Medicine

## 2020-07-11 ENCOUNTER — Encounter: Payer: Self-pay | Admitting: Family Medicine

## 2020-07-11 DIAGNOSIS — R42 Dizziness and giddiness: Secondary | ICD-10-CM

## 2020-07-15 NOTE — Telephone Encounter (Signed)
This was already done, closing chart

## 2020-07-22 LAB — HM DIABETES FOOT EXAM: HM Diabetic Foot Exam: NORMAL

## 2020-08-04 ENCOUNTER — Other Ambulatory Visit: Payer: Self-pay | Admitting: Family Medicine

## 2020-08-04 DIAGNOSIS — E559 Vitamin D deficiency, unspecified: Secondary | ICD-10-CM

## 2020-10-01 ENCOUNTER — Encounter: Payer: Self-pay | Admitting: Internal Medicine

## 2020-11-07 ENCOUNTER — Other Ambulatory Visit: Payer: Self-pay | Admitting: Family Medicine

## 2020-11-07 DIAGNOSIS — E7800 Pure hypercholesterolemia, unspecified: Secondary | ICD-10-CM

## 2021-01-01 DIAGNOSIS — E039 Hypothyroidism, unspecified: Secondary | ICD-10-CM | POA: Diagnosis not present

## 2021-01-01 DIAGNOSIS — R42 Dizziness and giddiness: Secondary | ICD-10-CM | POA: Diagnosis not present

## 2021-01-01 DIAGNOSIS — E109 Type 1 diabetes mellitus without complications: Secondary | ICD-10-CM | POA: Diagnosis not present

## 2021-01-15 DIAGNOSIS — Z0000 Encounter for general adult medical examination without abnormal findings: Secondary | ICD-10-CM | POA: Diagnosis not present

## 2021-01-15 DIAGNOSIS — Z1339 Encounter for screening examination for other mental health and behavioral disorders: Secondary | ICD-10-CM | POA: Diagnosis not present

## 2021-01-15 DIAGNOSIS — Z114 Encounter for screening for human immunodeficiency virus [HIV]: Secondary | ICD-10-CM | POA: Diagnosis not present

## 2021-01-15 DIAGNOSIS — Z1159 Encounter for screening for other viral diseases: Secondary | ICD-10-CM | POA: Diagnosis not present

## 2021-01-15 DIAGNOSIS — E559 Vitamin D deficiency, unspecified: Secondary | ICD-10-CM | POA: Diagnosis not present

## 2021-01-15 DIAGNOSIS — R5383 Other fatigue: Secondary | ICD-10-CM | POA: Diagnosis not present

## 2021-01-15 DIAGNOSIS — J069 Acute upper respiratory infection, unspecified: Secondary | ICD-10-CM | POA: Diagnosis not present

## 2021-01-15 DIAGNOSIS — E109 Type 1 diabetes mellitus without complications: Secondary | ICD-10-CM | POA: Diagnosis not present

## 2021-01-29 DIAGNOSIS — E039 Hypothyroidism, unspecified: Secondary | ICD-10-CM | POA: Diagnosis not present

## 2021-01-29 DIAGNOSIS — E7800 Pure hypercholesterolemia, unspecified: Secondary | ICD-10-CM | POA: Diagnosis not present

## 2021-01-29 DIAGNOSIS — D869 Sarcoidosis, unspecified: Secondary | ICD-10-CM | POA: Diagnosis not present

## 2021-01-29 DIAGNOSIS — E109 Type 1 diabetes mellitus without complications: Secondary | ICD-10-CM | POA: Diagnosis not present

## 2021-01-29 DIAGNOSIS — R945 Abnormal results of liver function studies: Secondary | ICD-10-CM | POA: Diagnosis not present

## 2021-02-04 ENCOUNTER — Encounter: Payer: No Typology Code available for payment source | Admitting: Family Medicine

## 2021-02-17 DIAGNOSIS — E039 Hypothyroidism, unspecified: Secondary | ICD-10-CM | POA: Diagnosis not present

## 2021-02-17 DIAGNOSIS — Z794 Long term (current) use of insulin: Secondary | ICD-10-CM | POA: Diagnosis not present

## 2021-02-17 DIAGNOSIS — E1065 Type 1 diabetes mellitus with hyperglycemia: Secondary | ICD-10-CM | POA: Diagnosis not present

## 2021-02-17 DIAGNOSIS — E063 Autoimmune thyroiditis: Secondary | ICD-10-CM | POA: Diagnosis not present

## 2021-02-17 LAB — LIPID PANEL: LDL Cholesterol: 82

## 2021-02-17 LAB — HEMOGLOBIN A1C: Hemoglobin A1C: 13.2

## 2021-02-17 LAB — MICROALBUMIN, URINE: Microalb, Ur: 14.7

## 2021-02-19 ENCOUNTER — Encounter: Payer: Self-pay | Admitting: *Deleted

## 2021-02-26 DIAGNOSIS — E039 Hypothyroidism, unspecified: Secondary | ICD-10-CM | POA: Diagnosis not present

## 2021-02-26 DIAGNOSIS — R945 Abnormal results of liver function studies: Secondary | ICD-10-CM | POA: Diagnosis not present

## 2021-02-26 DIAGNOSIS — Z23 Encounter for immunization: Secondary | ICD-10-CM | POA: Diagnosis not present

## 2021-02-26 DIAGNOSIS — E109 Type 1 diabetes mellitus without complications: Secondary | ICD-10-CM | POA: Diagnosis not present

## 2021-02-26 DIAGNOSIS — E7800 Pure hypercholesterolemia, unspecified: Secondary | ICD-10-CM | POA: Diagnosis not present

## 2021-02-26 DIAGNOSIS — E559 Vitamin D deficiency, unspecified: Secondary | ICD-10-CM | POA: Diagnosis not present

## 2021-03-29 NOTE — Progress Notes (Signed)
Chief Complaint  Patient presents with   Annual Exam    Nonfasting (soda only) annual exam. [facility] in [place] told him he has Hep A through labs. Patient reports that Dr. Cindra Eves office never referred him for appt with [facility]. He feels better, but thinks Dr. Buddy Duty is still concerned.     Alejandro Griffin is a 40 y.o. male who presents for a complete physical.    He was having problems back in March with episodes of vertigo. He states his symptoms had resolved by the time they got to his referral, and hasn't had any recurrences. He thinks aspartame may have caused some of his vertigo, improved after stopping.  Sarcoidosis:  Under the care of pulmonary, though not seen since 01/2020.  Has been in remission. He plans to call to schedule his yearly visit. He has a chronic, rare dry cough (his baseline), possibly a little worse at night recently.  Denies heartburn. Denies shortness of breath, chest tightness or chest pain.   Type 1 diabetes: under the care of Dr. Buddy Duty. This hasn't been well controlled, with last A1c of 13.2 in 02/2021. He had been off the pump when he lost the job, started back on it in January 2022, but has been off the pump since August due to cost.  He states Dr. Buddy Duty changed Tyler Aas from 25 to 30 units at night.  He is using PDM to help with regular insulin dosing for meals.  He reports compliance with his shots, admits to forgetting a mealtime dose about 2x/week.  He states Dr. Buddy Duty wants him to have insulin pump and Dexcom, but cost is prohibiting this. He admits that his sugars have remained high, ranging from 250-400's (rarely on the lower end, often in the 400's).  He admits to having 4 regular 20-ounce bottles of soda every day. He reports that he is accounting for this sugar with his injections, but sugars are still high. He states that the aspartame was triggering vertigo, which is why he won't drink diet soda.  Hypothyroidism: Managed by Dr. Buddy Duty. Current dose is  237 mcg (112 + 125 mcg doses).  Last TSH was 5.94 in 06/2020.  Dose wasn't changed. He denies changes in hair/skin/bowels/energy. He admits to forgetting to take med about 3x/week.    Hyperlipidemia: He is on atorvastatin 80mg . He admits to missing this about 2x/week.  LDL in 02/2021 at Dr. Cindra Eves was at goal, 54. He tries to follow a low cholesterol diet (red meat 2/week).  Vitamin D deficiency:  Last level was low at 20.6 in 03/2020, when taking [place] vitamins, but no additional D3. He was treated with 12 weeks of prescription therapy, and was advised to take 1000 IU of Vitamin D3 daily upon completion of the prescription, in addition to his [place] vitamins. He reports taking the 12 weeks of prescription, but never started any additional D3. He continues to take the [facility] vitamins daily (misses once a week, taken at night).  History of gastritis. He used to take pantoprazole chronically.  He stopped it a couple of years ago, with no recurrent heartburn, reflux or indigestion.  He had elevated TTG on labs ordered by Dr. Buddy Duty. Per Dr. Cindra Eves notes, he was referring pt to [facility] for further evaluation. Pt states GI consult never done. Had some bloating at that time. No longer having any GI complaints.  No bloating, diarrhea.  He states that he went to [facility] in [facility] in October. He  states he went to have Rx for meclizine to have on hand just in case.  He reports that routine labs were done, diagnosed with Hep A. He denies sypmtoms, unsure why checked. Sister-in-law works there. He was ordered to have a fibro scan later this month. Insistent that it was Hepatitis A, rather than B or C (which would be more likely to be asymptomatic, have carrier state, with liver concerns).  Will sign for records to be released. May need GI referral depending on the story.   Immunization History  Administered Date(s) Administered   Influenza,inj,Quad PF,6+ Mos 01/24/2013, 02/28/2014, 01/16/2016,  01/21/2017, 01/05/2018, 01/11/2019, 01/31/2020   Influenza-Unspecified 02/17/2021   PFIZER(Purple Top)SARS-COV-2 Vaccination 01/31/2020, 02/21/2020   Pneumococcal Polysaccharide-23 02/25/2017   Tdap 07/18/2004, 07/19/2014   Last colonoscopy: never Last PSA: never Dentist: twice yearly Ophtho: 01/2019 Exercise:  walks 2x/week x 30 minutes.  Heavy lifting at work.   PMH, PSH, SH and FH were reviewed and updated  Outpatient Encounter Medications as of 03/30/2021  Medication Sig Note   atorvastatin (LIPITOR) 80 MG tablet TAKE 1 TABLET BY MOUTH EVERY DAY    Continuous Blood Gluc Sensor (FREESTYLE LIBRE 2 SENSOR) MISC by Does not apply route.    insulin aspart (NOVOLOG) 100 UNIT/ML injection Inject into the skin See admin instructions. Via Omnipod insulin pump, continuously    levothyroxine (SYNTHROID) 112 MCG tablet Take 112 mcg by mouth daily before breakfast.    levothyroxine (SYNTHROID) 125 MCG tablet Take 125 mcg by mouth daily before breakfast.    NON FORMULARY dOTerra Lifelong Vitality 3-pack: Take 3 tablets from each of the three bottles once a day at bedtime    TRESIBA 100 UNIT/ML SOLN  03/30/2021: 30 U once daily (not currently on pump)   [DISCONTINUED] Continuous Blood Gluc Sensor (FREESTYLE LIBRE SENSOR SYSTEM) MISC APPLY ONE SENSOR TO BACK OF UPPER ARM EVERY 14 DAYS    acetaminophen (TYLENOL) 500 MG tablet Take 1,000 mg by mouth every 6 (six) hours as needed for moderate pain.  (Patient not taking: Reported on 01/31/2020) 03/30/2021: Takes only as needed   meclizine (ANTIVERT) 25 MG tablet Take 12.5-25 mg by mouth 3 (three) times daily as needed for dizziness or nausea. (Patient not taking: Reported on 07/07/2020)    [DISCONTINUED] amoxicillin (AMOXIL) 875 MG tablet Take 1 tablet (875 mg total) by mouth 2 (two) times daily.    [DISCONTINUED] Insulin Disposable Pump (OMNIPOD) MISC by Does not apply route. (Patient not taking: Reported on 03/30/2021)    [DISCONTINUED] Vitamin D,  Ergocalciferol, (DRISDOL) 1.25 MG (50000 UNIT) CAPS capsule Take 1 capsule (50,000 Units total) by mouth every 7 (seven) days. (Patient not taking: No sig reported) 07/02/2020: Took one dose and misplaced them   No facility-administered encounter medications on file as of 03/30/2021.   Allergies  Allergen Reactions   Nsaids Other (See Comments)    HAS ULCERS AND CAN TOLERATE ONLY TYLENOL    ROS: The patient denies anorexia, fever, weight changes, headaches, vision loss, decreased hearing, ear pain, hoarseness, chest pain, palpitations, dizziness, syncope, dyspnea on exertion, swelling, nausea, vomiting, diarrhea, constipation, abdominal pain, bloating, melena, hematochezia, indigestion/heartburn, hematuria, incontinence, erectile dysfunction, nocturia, weakened urine stream, dysuria, genital lesions, numbness, tingling, weakness, tremor, suspicious skin lesions, depression, anxiety, abnormal bleeding/bruising. Some decrease in libido. Mild/tolerable dry cough, per HPI He has h/o partial tear and bone spur of R shoulder--occasional pain (Dr French Ana). Denies other joint pains. Rash/dryness at his knuckles on his hands comes and goes--much better. No further  vertigo. Denies blurred vision, numbness/tingling, polydipsia, or polyuria. No hypoglycemia.   PHYSICAL EXAM:  BP 110/70   Pulse 96   Ht 5' 7.5" (1.715 m)   Wt 203 lb 6.4 oz (92.3 kg)   BMI 31.39 kg/m   Wt Readings from Last 3 Encounters:  03/30/21 203 lb 6.4 oz (92.3 kg)  07/07/20 204 lb 6.4 oz (92.7 kg)  07/02/20 203 lb 6.4 oz (92.3 kg)   General Appearance:    Alert, cooperative, no distress, appears stated age. Occasional dry cough noted during visit.  He is speaking comfortably, in no distress  Head:    Normocephalic, without obvious abnormality, atraumatic  Eyes:    PERRL, conjunctiva/corneas clear, EOM's intact, fundi benign  Ears:    Normal TM's and external ear canals.   Nose:   Not examined, wearing mask due to  COVID-19 pandemic  Throat:   Not examined, wearing mask due to COVID-19 pandemic  Neck:   Supple, no lymphadenopathy;  thyroid:  no enlargement/ tenderness/nodules; no carotid bruit or JVD  Back:    Spine nontender, no curvature, ROM normal, no CVA tenderness  Lungs:     Clear to auscultation bilaterally without wheezes, rales or ronchi; respirations unlabored  Chest Wall:    No tenderness or deformity   Heart:    Regular rate and rhythm, S1 and S2 normal, no murmur, rub or gallop  Breast Exam:    No chest wall tenderness, masses or gynecomastia  Abdomen:     Soft, non-tender, nondistended, normoactive bowel sounds, no masses, no hepatosplenomegaly  Genitalia:    Normal male external genitalia without lesions.  Testicles without masses.  No inguinal hernias.  Rectal:    Normal sphincter tone (very good, slight discomfort with exam), no nodules or masses. Prostate is not enlarged, smooth. Heme negative stool  Extremities:   No clubbing, cyanosis or edema.   Pulses:   2+ and symmetric all extremities  Skin:   Skin color, texture, turgor normal, no rashes or lesions. WHSS midline upper chest.  Dry skin on the knuckles (dorsum of MCP's) on R hand. Also some dry/thickened skin at anterior knees bilaterally. No other skin concerns.  Lymph nodes:   Cervical, supraclavicular and inguinal nodes normal  Neurologic:   Normal strength, sensation and gait; reflexes 2+ and symmetric throughout                                Psych:  Normal mood, affect, hygiene and grooming    ASSESSMENT/PLAN:  Annual physical exam - Plan: POCT Urinalysis DIP (Proadvantage Device)  Uncontrolled type 1 diabetes mellitus with hyperglycemia (HCC) - Poor diet with excess sugar. Some noncompliance with meds.  Needs to contact Dr. Buddy Duty for insulin adjustments. Risks reviewed  Hypothyroidism, unspecified type - euthyroid clinically.  suspect TSH elevated related to some missed pills. Strategies reviewed to try and help with  compliance  Vitamin D deficiency - noncompliant with taking add'l D3 as prev recommended; inadequate levels on MVI only in past. Recheck level and replete as indicated - Plan: VITAMIN D 25 Hydroxy (Vit-D Deficiency, Fractures)  Sarcoidosis - overall in remission. some chronic dry cough. Due to f/u with pulm  BMI 31.0-31.9,adult - counseled on exercise, healthy diet (cut out regular sodas!), weight loss  Pure hypercholesterolemia - LDL at goal on last check, cont 80mg  lipitor and low cholesterol diet - Plan: atorvastatin (LIPITOR) 80 MG tablet  Patient reports recently  being told of Hepatitis A (?), getting fibro scan of liver soon. Doubt Hep A, guessing B or C in chronic form since ordering scans Not referred to specialist, just getting scan. Will get records sent  Vit D--expect low levels.  If >20, plan to try and do with daily D3 rather than Rx for compliance measures. (He has proven that he doesn't remember to start the D3 upon completion of the Rx)  Recommended at least 30 minutes of aerobic activity at least 5 days/week, weight bearing exercise at least 2x/week; proper sunscreen use reviewed; healthy diet and alcohol recommendations (less than or equal to 2 drinks/day) reviewed; regular seatbelt use; changing batteries in smoke detectors. Immunization recommendations discussed--continue flu shots yearly. Bivalent COVID booster was declined, strongly encouraged. Colonoscopy age 3.   F/u 1 year for CPE, sooner prn To continue with regular endo f/u. Reviewed importance of getting sugars under better control to prevent severe complications of diabetes. While it is frustrating that he cannot afford the pump recommended by his endo, implored that he try and do his part with his diet.  He doesn't tolerate aspartame--encouraged nonsweetened drinks, or can look for Steevia instead.

## 2021-03-29 NOTE — Patient Instructions (Addendum)
  HEALTH MAINTENANCE RECOMMENDATIONS:  It is recommended that you get at least 30 minutes of aerobic exercise at least 5 days/week (for weight loss, you may need as much as 60-90 minutes). This can be any activity that gets your heart rate up. This can be divided in 10-15 minute intervals if needed, but try and build up your endurance at least once a week.  Weight bearing exercise is also recommended twice weekly.  Eat a healthy diet with lots of vegetables, fruits and fiber.  "Colorful" foods have a lot of vitamins (ie green vegetables, tomatoes, red peppers, etc).  Limit sweet tea, regular sodas and alcoholic beverages, all of which has a lot of calories and sugar.  Up to 2 alcoholic drinks daily may be beneficial for men (unless trying to lose weight, watch sugars).  Drink a lot of water.  Sunscreen of at least SPF 30 should be used on all sun-exposed parts of the skin when outside between the hours of 10 am and 4 pm (not just when at beach or pool, but even with exercise, golf, tennis, and yard work!)  Use a sunscreen that says "broad spectrum" so it covers both UVA and UVB rays, and make sure to reapply every 1-2 hours.  Remember to change the batteries in your smoke detectors when changing your clock times in the spring and fall.  Carbon monoxide detectors are recommended for your home.  Use your seat belt every time you are in a car, and please drive safely and not be distracted with cell phones and texting while driving.   Please cut out the regular soda as an easy way to help get your sugars down.  I am very worried about the longterm complications of diabetes with your persistently elevated blood sugars. Please drink more water, or selzer (anything unsweetened) Consider drinks sweetened with Raynelle Chary rather than aspartame.  Please call Dr. Cindra Eves office with your sugars.  You need to have your insulin adjusted to account for your diet.  Switch your atorvastatin (lipitor) to  nighttimedosing--take it with your vitamins.  I susspect your vitamin D will be low, as we saw last year that the [facility] vitamins wasn't maintaining your vitamin D levels.  We will let you know if you need another prescription (and if so that NEEDS to have daily D3 supplement started upon completion, or else we will be in the same boat again next year), or if your levels are okay enough to just start on a separate D3 now, and we will let you know the dose.  The D3 can be taken with your vitamins and cholesterol medication in the evening.  Please call and schedule your diabetic eye exam.  These need to be done once a year.

## 2021-03-30 ENCOUNTER — Encounter: Payer: Self-pay | Admitting: *Deleted

## 2021-03-30 ENCOUNTER — Encounter: Payer: Self-pay | Admitting: Family Medicine

## 2021-03-30 ENCOUNTER — Ambulatory Visit: Payer: BC Managed Care – PPO | Admitting: Family Medicine

## 2021-03-30 ENCOUNTER — Other Ambulatory Visit: Payer: Self-pay

## 2021-03-30 VITALS — BP 110/70 | HR 96 | Ht 67.5 in | Wt 203.4 lb

## 2021-03-30 DIAGNOSIS — E559 Vitamin D deficiency, unspecified: Secondary | ICD-10-CM

## 2021-03-30 DIAGNOSIS — E039 Hypothyroidism, unspecified: Secondary | ICD-10-CM | POA: Diagnosis not present

## 2021-03-30 DIAGNOSIS — Z6831 Body mass index (BMI) 31.0-31.9, adult: Secondary | ICD-10-CM | POA: Diagnosis not present

## 2021-03-30 DIAGNOSIS — E7800 Pure hypercholesterolemia, unspecified: Secondary | ICD-10-CM

## 2021-03-30 DIAGNOSIS — E1065 Type 1 diabetes mellitus with hyperglycemia: Secondary | ICD-10-CM | POA: Diagnosis not present

## 2021-03-30 DIAGNOSIS — Z0000 Encounter for general adult medical examination without abnormal findings: Secondary | ICD-10-CM | POA: Diagnosis not present

## 2021-03-30 DIAGNOSIS — D869 Sarcoidosis, unspecified: Secondary | ICD-10-CM

## 2021-03-30 LAB — POCT URINALYSIS DIP (PROADVANTAGE DEVICE)
Bilirubin, UA: NEGATIVE
Blood, UA: NEGATIVE
Glucose, UA: 500 mg/dL — AB
Ketones, POC UA: NEGATIVE mg/dL
Leukocytes, UA: NEGATIVE
Nitrite, UA: NEGATIVE
Protein Ur, POC: NEGATIVE mg/dL
Specific Gravity, Urine: 1.015
Urobilinogen, Ur: NEGATIVE
pH, UA: 6 (ref 5.0–8.0)

## 2021-03-30 MED ORDER — ATORVASTATIN CALCIUM 80 MG PO TABS: 80.0000 mg | ORAL_TABLET | Freq: Every day | ORAL | 3 refills | Status: DC

## 2021-03-31 LAB — VITAMIN D 25 HYDROXY (VIT D DEFICIENCY, FRACTURES): Vit D, 25-Hydroxy: 19.9 ng/mL — ABNORMAL LOW (ref 30.0–100.0)

## 2021-04-02 ENCOUNTER — Telehealth: Payer: Self-pay | Admitting: Family Medicine

## 2021-04-02 NOTE — Telephone Encounter (Signed)
Received requested records from [facility].

## 2021-04-10 DIAGNOSIS — E1065 Type 1 diabetes mellitus with hyperglycemia: Secondary | ICD-10-CM | POA: Diagnosis not present

## 2021-04-10 DIAGNOSIS — E063 Autoimmune thyroiditis: Secondary | ICD-10-CM | POA: Diagnosis not present

## 2021-04-10 DIAGNOSIS — Z794 Long term (current) use of insulin: Secondary | ICD-10-CM | POA: Diagnosis not present

## 2021-04-10 DIAGNOSIS — E039 Hypothyroidism, unspecified: Secondary | ICD-10-CM | POA: Diagnosis not present

## 2021-04-23 DIAGNOSIS — E559 Vitamin D deficiency, unspecified: Secondary | ICD-10-CM | POA: Diagnosis not present

## 2021-04-23 DIAGNOSIS — Z79899 Other long term (current) drug therapy: Secondary | ICD-10-CM | POA: Diagnosis not present

## 2021-04-23 DIAGNOSIS — E109 Type 1 diabetes mellitus without complications: Secondary | ICD-10-CM | POA: Diagnosis not present

## 2021-04-23 DIAGNOSIS — E7800 Pure hypercholesterolemia, unspecified: Secondary | ICD-10-CM | POA: Diagnosis not present

## 2021-04-23 DIAGNOSIS — E039 Hypothyroidism, unspecified: Secondary | ICD-10-CM | POA: Diagnosis not present

## 2021-04-29 ENCOUNTER — Telehealth: Payer: Self-pay | Admitting: *Deleted

## 2021-04-29 NOTE — Telephone Encounter (Signed)
Called patient to let him know that we got the records from [facility]. He states that he did not go there for a CPE. He went there because his sister in law works there and he needed an rx for meclizine and she made him an appt. I did let him know that a complete physical was done and that he then had one here 6 weeks later and I was not sure how his insurance would pay for that and he may receive some bills for the one here-he verbalized understanding. I also let him know that Romelle Starcher has their PA down as his PCP-he said he will call them. He was given Dr. Johnsie Kindred recommendations based on their notes. 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 Abdominal u/s normal, LFT's high but lots of reasons (thyroid off, elevated chol, etc) Dr. Tomi Bamberger recommends monitoring his LFT's to see if they decrease as other things are corrected. Fibro scan is NOT recommended by Dr. Tomi Bamberger. Patient said he will call in a few months for a follow up.

## 2021-05-21 DIAGNOSIS — E063 Autoimmune thyroiditis: Secondary | ICD-10-CM | POA: Diagnosis not present

## 2021-05-21 DIAGNOSIS — Z794 Long term (current) use of insulin: Secondary | ICD-10-CM | POA: Diagnosis not present

## 2021-05-21 DIAGNOSIS — E1065 Type 1 diabetes mellitus with hyperglycemia: Secondary | ICD-10-CM | POA: Diagnosis not present

## 2021-05-21 DIAGNOSIS — E039 Hypothyroidism, unspecified: Secondary | ICD-10-CM | POA: Diagnosis not present

## 2021-05-21 LAB — HEMOGLOBIN A1C: Hemoglobin A1C: 15.4

## 2021-06-10 DIAGNOSIS — E113291 Type 2 diabetes mellitus with mild nonproliferative diabetic retinopathy without macular edema, right eye: Secondary | ICD-10-CM | POA: Diagnosis not present

## 2021-06-16 ENCOUNTER — Other Ambulatory Visit: Payer: Self-pay

## 2021-06-16 ENCOUNTER — Ambulatory Visit (HOSPITAL_COMMUNITY)
Admission: EM | Admit: 2021-06-16 | Discharge: 2021-06-16 | Disposition: A | Discharge status: Home / Self Care | Payer: Self-pay | Attending: Family Medicine | Admitting: Family Medicine

## 2021-06-16 ENCOUNTER — Encounter (HOSPITAL_COMMUNITY): Payer: Self-pay

## 2021-06-16 DIAGNOSIS — R079 Chest pain, unspecified: Secondary | ICD-10-CM

## 2021-06-16 MED ORDER — ALUM & MAG HYDROXIDE-SIMETH 200-200-20 MG/5ML PO SUSP
30.0000 mL | Freq: Once | ORAL | Status: AC
  Administered 2021-06-16: 30 mL via ORAL

## 2021-06-16 MED ORDER — FAMOTIDINE 20 MG PO TABS: 20.0000 mg | ORAL_TABLET | Freq: Two times a day (BID) | ORAL | 0 refills | Status: DC

## 2021-06-16 MED ORDER — LIDOCAINE VISCOUS HCL 2 % MT SOLN
OROMUCOSAL | Status: AC
  Filled 2021-06-16: qty 15

## 2021-06-16 MED ORDER — LIDOCAINE VISCOUS HCL 2 % MT SOLN
15.0000 mL | Freq: Once | OROMUCOSAL | Status: AC
  Administered 2021-06-16: 15 mL via ORAL

## 2021-06-16 MED ORDER — ALUM & MAG HYDROXIDE-SIMETH 200-200-20 MG/5ML PO SUSP
ORAL | Status: AC
  Filled 2021-06-16: qty 30

## 2021-06-16 NOTE — ED Triage Notes (Signed)
Pt c/o chest pain that began this morning, pain is to right side of chest but does not radiate (sharp then dull in description). Patient denies SOB, denies cardiac hx and denies numbness / tingling. Pt a&ox4.

## 2021-06-16 NOTE — ED Provider Notes (Signed)
[facility]    CSN: [id number] Arrival date & time: 06/16/21  5176      History   Chief Complaint Chief Complaint  Patient presents with   Chest Pain    HPI Alejandro Griffin is a 41 y.o. male.    Chest Pain Here for lower sternal chest pain since this morning about 3 hours ago.  Its been sort of a little cramp/spasm that has been coming and going.  It actually is improved at this point.  A couple of times she has felt nauseated during these 3 hours.  But that is resolved.  No diaphoresis, no shortness of breath, and no palpitations.  EKG is unrevealing and has no ST segment elevation or depression.   Past medical history significant for diabetes, poorly controlled.  His last A1c this month was 15  He sees a general doctor and an endocrinologist  Past Medical History:  Diagnosis Date   Cough    Diabetes mellitus without complication ([place])    Dyspnea    Gastritis 2016   confirmed on bx on EGD. Dr. Hilarie Fredrickson   GERD (gastroesophageal reflux disease)    Hyperlipidemia    Hypothyroidism    Inguinal hernia    RT   Low testosterone 2016   better on repeat in 2017   Obesity    Sarcoidosis    Vitamin D deficiency     Patient Active Problem List   Diagnosis Date Noted   Uncontrolled type 1 diabetes mellitus with hyperglycemia ([place]) 02/01/2020   History of COVID-19 01/25/2020   Sarcoidosis 12/24/2016   Type 1 diabetes mellitus without complications ([place]) 16/10/3708   Maxillary sinusitis 03/26/2016   Chronic fatigue 03/26/2016   Daytime somnolence 03/26/2016   Cephalalgia 11/12/2015   Sleep concern 11/12/2015   Snoring 11/12/2015   Hypertension associated with diabetes ([place]) 04/25/2015   Hypothyroid 08/26/2014   Low testosterone 08/26/2014   Other fatigue 08/26/2014   Abnormal DTR (deep tendon reflex) 08/26/2014   Vitamin D deficiency 08/26/2014   Hyperlipidemia associated with type 2 diabetes mellitus ([place]) 02/28/2014   Obesity (BMI 30-39.9) 06/27/2013    Diabetes mellitus, type II, insulin dependent ([place]) 01/21/2012    Past Surgical History:  Procedure Laterality Date   HERNIA REPAIR     INGUINAL HERNIA REPAIR Right 02/20/2015   Procedure: RIGHT INGUINAL HERNIA REPAIR WITH MESH;  Surgeon: Donnie Mesa, MD;  Location: [facility];  Service: General;  Laterality: Right;   INSERTION OF MESH Right 02/20/2015   Procedure: INSERTION OF MESH;  Surgeon: Donnie Mesa, MD;  Location: [facility];  Service: General;  Laterality: Right;   MEDIASTINOSCOPY N/A 01/13/2017   Procedure: MEDIASTINOSCOPY;  Surgeon: Melrose Nakayama, MD;  Location: [facility];  Service: Thoracic;  Laterality: N/A;   WISDOM TOOTH EXTRACTION         Home Medications    Prior to Admission medications   Medication Sig Start Date End Date Taking? Authorizing Provider  famotidine (PEPCID) 20 MG tablet Take 1 tablet (20 mg total) by mouth 2 (two) times daily. 06/16/21  Yes Barrett Henle, MD  atorvastatin (LIPITOR) 80 MG tablet Take 1 tablet (80 mg total) by mouth daily. 03/30/21   Rita Ohara, MD  Continuous Blood Gluc Sensor (FREESTYLE LIBRE 2 SENSOR) MISC by Does not apply route.    [provider]  insulin aspart (NOVOLOG) 100 UNIT/ML injection Inject into the skin See admin instructions. Via Omnipod insulin pump, continuously    [provider]  levothyroxine (SYNTHROID) 112 MCG tablet Take 112 mcg by mouth daily before breakfast.    [provider]  levothyroxine (SYNTHROID) 125 MCG tablet Take 125 mcg by mouth daily before breakfast.    [provider]  meclizine (ANTIVERT) 25 MG tablet Take 12.5-25 mg by mouth 3 (three) times daily as needed for dizziness or nausea. Patient not taking: Reported on 07/07/2020    [provider]  NON FORMULARY dOTerra Lifelong Vitality 3-pack: Take 3 tablets from each of the three bottles once a day at bedtime    [provider]  TRESIBA 100 UNIT/ML SOLN  12/31/19   [provider]     Family History Family History  Problem Relation Age of Onset   Hypertension Mother    High Cholesterol Mother    Diabetes Mother        borderline   Heart attack Father 89   High Cholesterol Father    Diabetes Maternal Grandmother    Diabetes Maternal Grandfather    Diabetes Paternal Grandfather    Cancer Paternal Uncle        throat cancer (smoker); prostate cancer   Prostate cancer Paternal Uncle     Social History Social History   Tobacco Use   Smoking status: Never   Smokeless tobacco: Never  Vaping Use   Vaping Use: Never used  Substance Use Topics   Alcohol use: No   Drug use: No     Allergies   Nsaids   Review of Systems Review of Systems  Cardiovascular:  Positive for chest pain.    Physical Exam Triage Vital Signs ED Triage Vitals  Enc Vitals Group     BP 06/16/21 0936 121/77     Pulse Rate 06/16/21 0936 86     Resp 06/16/21 0936 20     Temp 06/16/21 0936 97.6 F (36.4 C)     Temp Source 06/16/21 0936 Oral     SpO2 06/16/21 0936 99 %     Weight --      Height --      Head Circumference --      Peak Flow --      Pain Score 06/16/21 0950 8     Pain Loc --      Pain Edu? --      Excl. in [place]? --    No data found.  Updated Vital Signs BP 121/77 (BP Location: Right Arm)    Pulse 86    Temp 97.6 F (36.4 C) (Oral)    Resp 20    SpO2 99%   Visual Acuity Right Eye Distance:   Left Eye Distance:   Bilateral Distance:    Right Eye Near:   Left Eye Near:    Bilateral Near:     Physical Exam Vitals reviewed.  Constitutional:      General: He is not in acute distress.    Appearance: He is not toxic-appearing.  HENT:     Mouth/Throat:     Mouth: Mucous membranes are moist.     Pharynx: No oropharyngeal exudate or posterior oropharyngeal erythema.  Eyes:     Extraocular Movements: Extraocular movements intact.     Pupils: Pupils are equal, round, and reactive to light.  Cardiovascular:     Rate and Rhythm: Normal rate and regular  rhythm.     Heart sounds: No murmur heard. Pulmonary:     Effort: Pulmonary effort is normal.     Breath sounds: Normal breath sounds. No wheezing,  rhonchi or rales.  Chest:     Chest wall: No tenderness.  Musculoskeletal:     Cervical back: Neck supple.  Lymphadenopathy:     Cervical: No cervical adenopathy.  Skin:    Capillary Refill: Capillary refill takes less than 2 seconds.     Coloration: Skin is not jaundiced or pale.  Neurological:     General: No focal deficit present.     Mental Status: He is oriented to person, place, and time.  Psychiatric:        Behavior: Behavior normal.     UC Treatments / Results  Labs (all labs ordered are listed, but only abnormal results are displayed) Labs Reviewed - No data to display  EKG   Radiology No results found.  Procedures Procedures (including critical care time)  Medications Ordered in UC Medications  alum & mag hydroxide-simeth (MAALOX/MYLANTA) 200-200-20 MG/5ML suspension 30 mL (30 mLs Oral Given 06/16/21 1025)    And  lidocaine (XYLOCAINE) 2 % viscous mouth solution 15 mL (15 mLs Oral Given 06/16/21 1024)    Initial Impression / Assessment and Plan / UC Course  I have reviewed the triage vital signs and the nursing notes.  Pertinent labs & imaging results that were available during my care of the patient were reviewed by me and considered in my medical decision making (see chart for details).     With his nl bp and HR, cardiac origin of the cp is unlikely.  He had complete relief of his symptoms with a GI cocktail.  We will treat with an H2 blocker and have him notify his primary office of the symptoms he had today. Final Clinical Impressions(s) / UC Diagnoses   Final diagnoses:  Chest pain, unspecified type     Discharge Instructions      EKG looked okay.  Take famotidine 20 mg, 1 tablet twice daily to reduce stomach acid  Call your primary care office this week and let them know how you felt  today.     ED Prescriptions     Medication Sig Dispense Auth. Provider   famotidine (PEPCID) 20 MG tablet Take 1 tablet (20 mg total) by mouth 2 (two) times daily. 30 tablet Bertina Guthridge, Gwenlyn Perking, MD      PDMP not reviewed this encounter.   Barrett Henle, MD 06/16/21 1047

## 2021-06-16 NOTE — Discharge Instructions (Addendum)
EKG looked okay.  Take famotidine 20 mg, 1 tablet twice daily to reduce stomach acid  Call your primary care office this week and let them know how you felt today.

## 2021-06-22 DIAGNOSIS — Z794 Long term (current) use of insulin: Secondary | ICD-10-CM | POA: Diagnosis not present

## 2021-06-22 DIAGNOSIS — E1065 Type 1 diabetes mellitus with hyperglycemia: Secondary | ICD-10-CM | POA: Diagnosis not present

## 2021-06-22 DIAGNOSIS — E039 Hypothyroidism, unspecified: Secondary | ICD-10-CM | POA: Diagnosis not present

## 2021-06-22 DIAGNOSIS — E063 Autoimmune thyroiditis: Secondary | ICD-10-CM | POA: Diagnosis not present

## 2021-06-26 DIAGNOSIS — R768 Other specified abnormal immunological findings in serum: Secondary | ICD-10-CM | POA: Diagnosis not present

## 2021-07-13 DIAGNOSIS — E039 Hypothyroidism, unspecified: Secondary | ICD-10-CM | POA: Diagnosis not present

## 2021-08-20 DIAGNOSIS — E1065 Type 1 diabetes mellitus with hyperglycemia: Secondary | ICD-10-CM | POA: Diagnosis not present

## 2021-08-20 DIAGNOSIS — Z794 Long term (current) use of insulin: Secondary | ICD-10-CM | POA: Diagnosis not present

## 2021-08-20 DIAGNOSIS — E063 Autoimmune thyroiditis: Secondary | ICD-10-CM | POA: Diagnosis not present

## 2021-08-20 DIAGNOSIS — E039 Hypothyroidism, unspecified: Secondary | ICD-10-CM | POA: Diagnosis not present

## 2021-08-20 LAB — HM DIABETES FOOT EXAM: HM Diabetic Foot Exam: NORMAL

## 2021-09-02 DIAGNOSIS — E039 Hypothyroidism, unspecified: Secondary | ICD-10-CM | POA: Diagnosis not present

## 2021-09-02 DIAGNOSIS — E1065 Type 1 diabetes mellitus with hyperglycemia: Secondary | ICD-10-CM | POA: Diagnosis not present

## 2021-09-30 ENCOUNTER — Ambulatory Visit: Payer: BC Managed Care – PPO | Admitting: Family Medicine

## 2021-09-30 ENCOUNTER — Encounter: Payer: Self-pay | Admitting: Family Medicine

## 2021-09-30 VITALS — BP 122/76 | HR 81 | Temp 97.6°F | Wt 219.0 lb

## 2021-09-30 DIAGNOSIS — M79672 Pain in left foot: Secondary | ICD-10-CM | POA: Diagnosis not present

## 2021-09-30 DIAGNOSIS — E1065 Type 1 diabetes mellitus with hyperglycemia: Secondary | ICD-10-CM

## 2021-09-30 DIAGNOSIS — G5702 Lesion of sciatic nerve, left lower limb: Secondary | ICD-10-CM | POA: Diagnosis not present

## 2021-09-30 MED ORDER — MELOXICAM 15 MG PO TABS: 15.0000 mg | ORAL_TABLET | Freq: Every day | ORAL | 0 refills | Status: DC

## 2021-09-30 NOTE — Patient Instructions (Addendum)
  Try using a heel cushion vs new arch supports for your work boots. Do the figure of 4 stretches as shown, multiple times/day.  This stretches the deep buttock muscles and can help alleviate pinching of the sciatic nerve.  There may be a component of plantar fasciitis, but your tenderness extender over the entire heel, so may be more related to shoes vs from a nerve. I do think there is a component of a pinched nerve, related to the muscles in the buttock.  The stretches will help.  Move your wallet out of the left back pocket, as this can contribute to irritation of the area.   Take the meloxicam once daily with food.  Take it for 7-10 days, and stop when better. You can complete the full 2 week course, if needed. You need to take either Prilosec OTC or restart your pepcid and take either one of these regularly while you are taking the meloxicam, to prevent ulcers.

## 2021-09-30 NOTE — Progress Notes (Signed)
Chief Complaint  Patient presents with   Foot Pain    Left foot going up to hip sometimes    3 weeks ago he started with pain at the back/bottom of the left heel. No change in activity or change in shoes. Sometimes he gets excruciating pains from the hip to the heel, which can happen with sitting, walking. Rarely wakes him up at night. Denies back pain. Denies numbness or tingling in his feet. Denies weakness or giving way in the leg.  Pain is fairly constant, worse with walking.  Not too bad initially, no pain first thing in the morning or after sitting.  Hurts in all shoes--notes it is worse in his workboots.  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  Icy Hot patch or other cream haven't helped much.  Hasn't tried other medications/treatments for pain yet.  DM--last A1c 10.9% per pt in early June (had been 15% in Feb) Improving since back on pump (Feb-March 2023) Cr 1.06 in 02/2021 (Dr. Cindra Eves office)    PMH, [place], [place] reviewed H/o PUD in past.  Outpatient Encounter Medications as of 09/30/2021  Medication Sig Note   atorvastatin (LIPITOR) 80 MG tablet Take 1 tablet (80 mg total) by mouth daily.    Continuous Blood Gluc Transmit (DEXCOM G6 TRANSMITTER) MISC See admin instructions.    insulin aspart (NOVOLOG) 100 UNIT/ML injection Inject into the skin See admin instructions. Via Omnipod insulin pump, continuously    levothyroxine (SYNTHROID) 137 MCG tablet Take 137 mcg by mouth daily before breakfast. Two tab QD    liothyronine (CYTOMEL) 5 MCG tablet Take 5 mcg by mouth daily.    NON FORMULARY dOTerra Lifelong Vitality 3-pack: Take 3 tablets from each of the three bottles once a day at bedtime    [DISCONTINUED] Continuous Blood Gluc Sensor (FREESTYLE LIBRE 2 SENSOR) MISC by Does not apply route.    famotidine (PEPCID) 20 MG tablet Take 1 tablet (20 mg total) by mouth 2 (two) times daily. (Patient not taking: Reported on 09/30/2021)  09/30/2021: No longer needs   meclizine (ANTIVERT) 25 MG tablet Take 12.5-25 mg by mouth 3 (three) times daily as needed for dizziness or nausea. (Patient not taking: Reported on 07/07/2020)    TRESIBA 100 UNIT/ML SOLN  (Patient not taking: Reported on 09/30/2021) 03/30/2021: 30 U once daily (not currently on pump)   [DISCONTINUED] levothyroxine (SYNTHROID) 112 MCG tablet Take 112 mcg by mouth daily before breakfast. (Patient not taking: Reported on 09/30/2021)    [DISCONTINUED] levothyroxine (SYNTHROID) 125 MCG tablet Take 125 mcg by mouth daily before breakfast. (Patient not taking: Reported on 09/30/2021)    No facility-administered encounter medications on file as of 09/30/2021.   Allergies  Allergen Reactions   Nsaids Other (See Comments)    HAS ULCERS AND CAN TOLERATE ONLY TYLENOL   ROS: no fever, chills, URI symptoms, chest pain, numbness, tingling, weakness.  No GI or GU complaints. Left heel and hip pain per HPI.  No rashes, bruising   PHYSICAL EXAM:  BP 122/76   Pulse 81   Temp 97.6 F (36.4 C)   Wt 219 lb (99.3 kg)   SpO2 95%   BMI 33.79 kg/m   Wt Readings from Last 3 Encounters:  09/30/21 219 lb (99.3 kg)  03/30/21 203 lb 6.4 oz (92.3 kg)  07/07/20 204 lb 6.4 oz (92.7 kg)   Well-appearing, obese male in no distress HEENT: conjunctiva and sclera are clear  Back: no spinal, SI or CVA tenderness Tender at deep buttock muscles on the left and with pyriformis stretch. FROM with pyriformis, no spasm, but caused discomfort. Extremities: Mild tenderness at anteromedial calcaneus, but also has some diffuse tenderness at the bottom/posterior heel (not part of plantar fascia). No abnormal swelling, redness, skin is normal/intact. 2+ pulses. Nontender along IT band and at trochanteric bursa Skin: no rashes or lesions, normal turgor ;psych: normal mood, affect, hygiene and grooming Neuro: normal strength, gait.   ASSESSMENT/PLAN:  Pain of left heel - some component of PF, but  discomfort is more diffuse over posterior heel on bottom of foot.  Discussed shoewear, icing. NSAID trial - Plan: meloxicam (MOBIC) 15 MG tablet  Pyriformis syndrome, left - shown stretches. to remove wallet from L pocket. Trial of NSAIDs, risks reviewed, to take H2 or PPI along with it. - Plan: meloxicam (MOBIC) 15 MG tablet  Uncontrolled type 1 diabetes mellitus with hyperglycemia ([place]) - improving with pump, still not well controlled  F/u if sx persist/worsen.  Next steps could include sports med vs PT.   I spent 31 minutes dedicated to the care of this patient, including pre-visit review of records, face to face time, post-visit ordering of testing and documentation.   Try using a heel cushion vs new arch supports for your work boots. Do the figure of 4 stretches as shown, multiple times/day.  This stretches the deep buttock muscles and can help alleviate pinching of the sciatic nerve.  There may be a component of plantar fasciitis, but your tenderness extender over the entire heel, so may be more related to shoes vs from a nerve. I do think there is a component of a pinched nerve, related to the muscles in the buttock.  The stretches will help.  Move your wallet out of the left back pocket, as this can contribute to irritation of the area.  Take the meloxicam once daily with food.  Take it for 7-10 days, and stop when better. You can complete the full 2 week course, if needed. You need to take either Prilosec OTC or restart your pepcid and take either one of these regularly while you are taking the meloxicam, to prevent ulcers.

## 2021-10-21 DIAGNOSIS — E039 Hypothyroidism, unspecified: Secondary | ICD-10-CM | POA: Diagnosis not present

## 2021-11-06 DIAGNOSIS — Z794 Long term (current) use of insulin: Secondary | ICD-10-CM | POA: Diagnosis not present

## 2021-11-06 DIAGNOSIS — E039 Hypothyroidism, unspecified: Secondary | ICD-10-CM | POA: Diagnosis not present

## 2021-11-06 DIAGNOSIS — E1065 Type 1 diabetes mellitus with hyperglycemia: Secondary | ICD-10-CM | POA: Diagnosis not present

## 2021-11-06 DIAGNOSIS — E063 Autoimmune thyroiditis: Secondary | ICD-10-CM | POA: Diagnosis not present

## 2021-11-06 LAB — HEMOGLOBIN A1C: Hemoglobin A1C: 10.9

## 2021-12-23 ENCOUNTER — Encounter: Payer: Self-pay | Admitting: Internal Medicine

## 2022-01-26 ENCOUNTER — Encounter: Payer: Self-pay | Admitting: Internal Medicine

## 2022-02-11 DIAGNOSIS — E063 Autoimmune thyroiditis: Secondary | ICD-10-CM | POA: Diagnosis not present

## 2022-02-11 DIAGNOSIS — E039 Hypothyroidism, unspecified: Secondary | ICD-10-CM | POA: Diagnosis not present

## 2022-02-11 DIAGNOSIS — Z794 Long term (current) use of insulin: Secondary | ICD-10-CM | POA: Diagnosis not present

## 2022-02-11 DIAGNOSIS — E1065 Type 1 diabetes mellitus with hyperglycemia: Secondary | ICD-10-CM | POA: Diagnosis not present

## 2022-02-11 DIAGNOSIS — Z13220 Encounter for screening for lipoid disorders: Secondary | ICD-10-CM | POA: Diagnosis not present

## 2022-03-04 ENCOUNTER — Ambulatory Visit (INDEPENDENT_AMBULATORY_CARE_PROVIDER_SITE_OTHER): Payer: BC Managed Care – PPO | Admitting: Family Medicine

## 2022-03-04 ENCOUNTER — Encounter: Payer: Self-pay | Admitting: Family Medicine

## 2022-03-04 VITALS — BP 100/70 | HR 80 | Ht 67.5 in | Wt 201.2 lb

## 2022-03-04 DIAGNOSIS — N528 Other male erectile dysfunction: Secondary | ICD-10-CM

## 2022-03-04 DIAGNOSIS — E1065 Type 1 diabetes mellitus with hyperglycemia: Secondary | ICD-10-CM

## 2022-03-04 MED ORDER — TADALAFIL 5 MG PO TABS: 5.0000 mg | ORAL_TABLET | Freq: Every day | ORAL | 5 refills | Status: DC

## 2022-03-04 NOTE — Progress Notes (Signed)
Chief Complaint  Patient presents with   Erectile Dysfunction    Having some issues with ED, states he has mentioned to you before.    Patient presents accompanied by his wife to discuss issues with erectile problems. His wife states this has been going on for 6 months.  Sometimes it can take up to 20 minutes to get an erection.  Sometimes he loses the erection during intercourse, sometimes can't get erect at all.  She reports that about twice a month that have full, normal intercourse, though attempt 3x/week. He notes mild decrease in libido (works a lot, tired).  He is a diabetic, under the care of Dr. Buddy Duty, and has been uncontrolled for quite some time.  He reports that his last A1c was 15.7.  Chart reviewed-- H/o low testosterone (278) in 07/2014 Repeat in 10/2015 was normal at 524  PMH, PSH, SH reviewed  Outpatient Encounter Medications as of 03/04/2022  Medication Sig Note   atorvastatin (LIPITOR) 80 MG tablet Take 1 tablet (80 mg total) by mouth daily.    Continuous Blood Gluc Transmit (DEXCOM G6 TRANSMITTER) MISC See admin instructions.    insulin aspart (NOVOLOG) 100 UNIT/ML injection Inject into the skin See admin instructions. Via Omnipod insulin pump, continuously    levothyroxine (SYNTHROID) 300 MCG tablet Take 300 mcg by mouth daily.    liothyronine (CYTOMEL) 5 MCG tablet Take 5 mcg by mouth daily.    NON FORMULARY dOTerra Lifelong Vitality 3-pack: Take 3 tablets from each of the three bottles once a day at bedtime    tadalafil (CIALIS) 5 MG tablet Take 1 tablet (5 mg total) by mouth daily.    [DISCONTINUED] levothyroxine (SYNTHROID) 137 MCG tablet Take 137 mcg by mouth daily before breakfast. Two tab QD    famotidine (PEPCID) 20 MG tablet Take 1 tablet (20 mg total) by mouth 2 (two) times daily. (Patient not taking: Reported on 09/30/2021) 09/30/2021: No longer needs   meclizine (ANTIVERT) 25 MG tablet Take 12.5-25 mg by mouth 3 (three) times daily as needed for dizziness or  nausea. (Patient not taking: Reported on 07/07/2020)    meloxicam (MOBIC) 15 MG tablet Take 1 tablet (15 mg total) by mouth daily. Take with food, daily until pain resolves. (Patient not taking: Reported on 03/04/2022) 03/04/2022: prn   TRESIBA 100 UNIT/ML SOLN  (Patient not taking: Reported on 09/30/2021) 03/30/2021: 30 U once daily (not currently on pump)   No facility-administered encounter medications on file as of 03/04/2022.   NOT taking cialis prior to today's visit  Allergies  Allergen Reactions   Nsaids Other (See Comments)    HAS ULCERS AND CAN TOLERATE ONLY TYLENOL    ROS: no fever, chills, URI symptoms, fatigue.  No urinary complaints. Mild decrease in libido and ED per HPI.   PHYSICAL EXAM:  BP 100/70   Pulse 80   Ht 5' 7.5" (1.715 m)   Wt 201 lb 3.2 oz (91.3 kg)   BMI 31.05 kg/m   Wt Readings from Last 3 Encounters:  03/04/22 201 lb 3.2 oz (91.3 kg)  09/30/21 219 lb (99.3 kg)  03/30/21 203 lb 6.4 oz (92.3 kg)   Well-appearing, pleasant male, in no distress. He is alert and oriented.  Cranial nerves grossly intact. Normal mood, affect, hygiene and grooming. Normal gait, speech, eye contact. No other exam performed.   ASSESSMENT/PLAN:  Other male erectile dysfunction - d/t uncontrolled DM.  h/o low testosterone, possibly could contribute.  Check labs if oral meds ineffective.  If normal T, refer to urology for other tx options - Plan: tadalafil (CIALIS) 5 MG tablet  Uncontrolled type 1 diabetes mellitus with hyperglycemia ([place]) - Discussed that his diabetes is likely a large factor in his ED, and might be resistant to treatment. Encouraged better DM control  If oral meds don't work, recheck testosterone If testosterone normal, refer to urologist  Med options (sildenafil--Viagra vs generic '20mg'$ , cialis--daily vs 10-'20mg'$  qod prn) reviewed in detail, discussed where to get. Discussed other sx of low T, weight loss rec, improved control of DM rec. All questions  of patient and wife were answered.  I spent 35 minutes dedicated to the care of this patient, including pre-visit review of records, face to face time, post-visit ordering of testing and documentation.   Assuming that you can get the Cialis '5mg'$  from the pharmacy (covered), you can either take 1 pill every day, or you can take between 2-4 pills as needed prior to intercourse.  This may last for 36 hours or more. If you do not get any response to max dose of '20mg'$  as needed (or the daily '5mg'$ ), we can try a different oral medication (Viagra), and we can also have you return for morning testosterone level. If testosterone is okay but oral medications don't work, we will refer you to the urologist.

## 2022-03-04 NOTE — Patient Instructions (Signed)
Assuming that you can get the Cialis '5mg'$  from the pharmacy (covered), you can either take 1 pill every day, or you can take between 2-4 pills as needed prior to intercourse.  This may last for 36 hours or more (don't take more than every other day). If you do not get any response to max dose of '20mg'$  as needed (or the daily '5mg'$ ), we can try a different oral medication (Viagra), and we can also have you return for morning testosterone level. If testosterone is okay but oral medications don't work, we will refer you to the urologist.

## 2022-03-22 ENCOUNTER — Telehealth: Payer: Self-pay | Admitting: Nutrition

## 2022-03-22 NOTE — Telephone Encounter (Signed)
LVM to call me to schedule pump training 

## 2022-04-05 ENCOUNTER — Encounter: Payer: BC Managed Care – PPO | Admitting: Family Medicine

## 2022-04-22 ENCOUNTER — Encounter: Payer: Self-pay | Admitting: Family Medicine

## 2022-04-22 ENCOUNTER — Telehealth: Payer: Self-pay | Admitting: Family Medicine

## 2022-04-22 NOTE — Telephone Encounter (Signed)
I will not prescribe anything without evaluating him.  Looking at his chart, he used to take famotidine (pepcid), and was noted that it was stopped because he no longer needed it.  This is available over-the-counter.  Other option is he can try Prilosec OTC and see if that helps, and if it doesn't needs a visit (not to take longer than 2 weeks without being evaluated). Ensure that he isn't taking any NSAIDs (ibuprofen, meloxicam (he previously had a prescription for that), aleve, etc) which can bother his stomach.  He cancelled his last physical last month, and hasn't rescheduled.  Last CPE was 03/2021, needs to be scheduled.

## 2022-04-22 NOTE — Telephone Encounter (Signed)
Pt called and asked to be prescribed something for his stomach, he feels like he has a stomach ulcer or acid reflux. I did tell him he would need an appointment but he denied it and was persistent on me sending back a message to you.

## 2022-04-23 ENCOUNTER — Encounter (HOSPITAL_COMMUNITY): Payer: Self-pay | Admitting: *Deleted

## 2022-04-23 ENCOUNTER — Ambulatory Visit (HOSPITAL_COMMUNITY)
Admission: EM | Admit: 2022-04-23 | Discharge: 2022-04-23 | Disposition: A | Discharge status: Home / Self Care | Payer: BC Managed Care – PPO | Attending: Family Medicine | Admitting: Family Medicine

## 2022-04-23 DIAGNOSIS — K529 Noninfective gastroenteritis and colitis, unspecified: Secondary | ICD-10-CM | POA: Diagnosis not present

## 2022-04-23 MED ORDER — ONDANSETRON HCL 4 MG/2ML IJ SOLN
INTRAMUSCULAR | Status: AC
  Filled 2022-04-23: qty 2

## 2022-04-23 MED ORDER — ONDANSETRON HCL 4 MG/2ML IJ SOLN
4.0000 mg | Freq: Once | INTRAMUSCULAR | Status: AC
  Administered 2022-04-23: 4 mg via INTRAMUSCULAR

## 2022-04-23 MED ORDER — TRESIBA 100 UNIT/ML ~~LOC~~ SOLN: 25.0000 [IU] | Freq: Every day | SUBCUTANEOUS | 0 refills | Status: DC

## 2022-04-23 MED ORDER — "INSULIN SYRINGE 28G X 1/2"" 0.5 ML MISC": 0 refills | Status: AC

## 2022-04-23 MED ORDER — PROMETHAZINE HCL 25 MG RE SUPP: 25.0000 mg | Freq: Four times a day (QID) | RECTAL | 0 refills | Status: DC | PRN

## 2022-04-23 NOTE — Discharge Instructions (Signed)
You have been given an injection of ondansetron for the nausea  Promethazine suppository 25 mg--put 1 suppository in your rectum every 6 hours as needed for nausea or vomiting.  The Tyler Aas is sent in for 25 units daily.  If you continue to vomit or cannot take in much fluids or food despite the medication, please present to the emergency room for more evaluation and treatment

## 2022-04-23 NOTE — ED Triage Notes (Signed)
Pt states he has some constipation, vomiting, and burping a lot since Wednesday night. He took an old rx for nausea he had for awhile last night but states it didn't help.   He states he is type 1 and on a omni pod but it hasn't been working so he is doing insulin injections (novolog 15 units TID) he doesn't have any long lasting insulin. He does have a dexcom and states his BS have been elevated His current BS is 298 per dexcom.

## 2022-04-23 NOTE — ED Provider Notes (Signed)
[facility]    CSN: [id number] Arrival date & time: 04/23/22  1810      History   Chief Complaint Chief Complaint  Patient presents with   Emesis   Extremity Weakness   Constipation    HPI Alejandro Griffin is a 42 y.o. male.    Emesis Extremity Weakness  Constipation Associated symptoms: vomiting    Here for vomiting and feeling weak.  Also for type 1 diabetes.  On January 3 he began having emesis.  He is finding it very difficult to keep any food down.  Today he has kept some fluids down but he still dry heaves and still throws up some.  Last bowel movement was yesterday.  No blood in the stool or in the emesis.  No fever but he is felt very tired and weak.  He has type 1 diabetes and sugars are running in the 200s.  He does usually have a continuous insulin infusion, but the injector is not working, and he has had a hard time contacting his endocrinologist.  He will see them on January 10.  He does have NovoLog he is injecting, but he used to be on Tresiba and would like a short prescription of that till he sees them  He did take some Zofran ODT and it did not help his nausea or vomiting  Past Medical History:  Diagnosis Date   Cough    Diabetes mellitus without complication ([place])    Dyspnea    Gastritis 2016   confirmed on bx on EGD. Dr. Hilarie Fredrickson   GERD (gastroesophageal reflux disease)    Hyperlipidemia    Hypothyroidism    Inguinal hernia    RT   Low testosterone 2016   better on repeat in 2017   Obesity    Sarcoidosis    Vitamin D deficiency     Patient Active Problem List   Diagnosis Date Noted   Uncontrolled type 1 diabetes mellitus with hyperglycemia ([name]) 02/01/2020   History of COVID-19 01/25/2020   Sarcoidosis 12/24/2016   Type 1 diabetes mellitus without complications ([place]) 23/76/2831   Maxillary sinusitis 03/26/2016   Chronic fatigue 03/26/2016   Daytime somnolence 03/26/2016   Cephalalgia 11/12/2015   Sleep concern  11/12/2015   Snoring 11/12/2015   Hypertension associated with diabetes ([place]) 04/25/2015   Hypothyroid 08/26/2014   Low testosterone 08/26/2014   Other fatigue 08/26/2014   Abnormal DTR (deep tendon reflex) 08/26/2014   Vitamin D deficiency 08/26/2014   Hyperlipidemia associated with type 2 diabetes mellitus ([place]) 02/28/2014   Obesity (BMI 30-39.9) 06/27/2013   Diabetes mellitus, type II, insulin dependent ([place]) 01/21/2012    Past Surgical History:  Procedure Laterality Date   HERNIA REPAIR     INGUINAL HERNIA REPAIR Right 02/20/2015   Procedure: RIGHT INGUINAL HERNIA REPAIR WITH MESH;  Surgeon: Donnie Mesa, MD;  Location: [facility];  Service: General;  Laterality: Right;   INSERTION OF MESH Right 02/20/2015   Procedure: INSERTION OF MESH;  Surgeon: Donnie Mesa, MD;  Location: [facility];  Service: General;  Laterality: Right;   MEDIASTINOSCOPY N/A 01/13/2017   Procedure: MEDIASTINOSCOPY;  Surgeon: Melrose Nakayama, MD;  Location: [facility];  Service: Thoracic;  Laterality: N/A;   WISDOM TOOTH EXTRACTION         Home Medications    Prior to Admission medications   Medication Sig Start Date End Date Taking? Authorizing Provider  atorvastatin (LIPITOR) 80 MG tablet Take 1 tablet (80 mg total) by mouth  daily. 03/30/21  Yes Rita Ohara, MD  Continuous Blood Gluc Transmit (DEXCOM G6 TRANSMITTER) MISC See admin instructions. 09/11/21  Yes [provider]  insulin aspart (NOVOLOG) 100 UNIT/ML injection Inject into the skin See admin instructions. Via Omnipod insulin pump, continuously   Yes [provider]  Insulin Syringe-Needle U-100 (INSULIN SYRINGE .5CC/28G) 28G X 1/2" 0.5 ML MISC 1 injection daily 04/23/22  Yes Easten Maceachern, Gwenlyn Perking, MD  levothyroxine (SYNTHROID) 300 MCG tablet Take 300 mcg by mouth daily. 03/01/22  Yes [provider]  liothyronine (CYTOMEL) 5 MCG tablet Take 5 mcg by mouth daily. 09/09/21  Yes [provider]  NON FORMULARY dOTerra  Lifelong Vitality 3-pack: Take 3 tablets from each of the three bottles once a day at bedtime   Yes [provider]  promethazine (PHENERGAN) 25 MG suppository Place 1 suppository (25 mg total) rectally every 6 (six) hours as needed for nausea or vomiting. 04/23/22  Yes Barrett Henle, MD  tadalafil (CIALIS) 5 MG tablet Take 1 tablet (5 mg total) by mouth daily. 03/04/22  Yes Rita Ohara, MD  TRESIBA 100 UNIT/ML SOLN Inject 25 Units into the skin daily. 04/23/22   Barrett Henle, MD    Family History Family History  Problem Relation Age of Onset   Hypertension Mother    High Cholesterol Mother    Diabetes Mother        borderline   Heart attack Father 58   High Cholesterol Father    Diabetes Maternal Grandmother    Diabetes Maternal Grandfather    Diabetes Paternal Grandfather    Cancer Paternal Uncle        throat cancer (smoker); prostate cancer   Prostate cancer Paternal Uncle     Social History Social History   Tobacco Use   Smoking status: Never   Smokeless tobacco: Never  Vaping Use   Vaping Use: Never used  Substance Use Topics   Alcohol use: No   Drug use: No     Allergies   Nsaids   Review of Systems Review of Systems  Gastrointestinal:  Positive for constipation and vomiting.  Musculoskeletal:  Positive for extremity weakness.     Physical Exam Triage Vital Signs ED Triage Vitals  Enc Vitals Group     BP --      Pulse Rate 04/23/22 1940 (!) 111     Resp 04/23/22 1940 18     Temp 04/23/22 1940 97.7 F (36.5 C)     Temp Source 04/23/22 1940 Oral     SpO2 04/23/22 1940 95 %     Weight --      Height --      Head Circumference --      Peak Flow --      Pain Score 04/23/22 1938 0     Pain Loc --      Pain Edu? --      Excl. in [place]? --    No data found.  Updated Vital Signs Pulse (!) 111   Temp 97.7 F (36.5 C) (Oral)   Resp 18   SpO2 95%   Visual Acuity Right Eye Distance:   Left Eye Distance:   Bilateral Distance:     Right Eye Near:   Left Eye Near:    Bilateral Near:     Physical Exam Vitals reviewed.  Constitutional:      General: He is not in acute distress.    Appearance: He is not ill-appearing, toxic-appearing or diaphoretic.  HENT:     Nose: Nose normal.     Mouth/Throat:     Mouth: Mucous membranes are moist.     Pharynx: No oropharyngeal exudate.  Eyes:     Extraocular Movements: Extraocular movements intact.     Pupils: Pupils are equal, round, and reactive to light.  Cardiovascular:     Rate and Rhythm: Normal rate and regular rhythm.     Heart sounds: No murmur heard. Pulmonary:     Effort: Pulmonary effort is normal. No respiratory distress.     Breath sounds: Normal breath sounds. No stridor. No wheezing, rhonchi or rales.  Abdominal:     General: Bowel sounds are normal.     Palpations: Abdomen is soft.     Tenderness: There is abdominal tenderness (generalized).  Musculoskeletal:     Cervical back: Neck supple.  Lymphadenopathy:     Cervical: No cervical adenopathy.  Skin:    Coloration: Skin is not jaundiced or pale.  Neurological:     General: No focal deficit present.     Mental Status: He is alert and oriented to person, place, and time.  Psychiatric:        Behavior: Behavior normal.      UC Treatments / Results  Labs (all labs ordered are listed, but only abnormal results are displayed) Labs Reviewed - No data to display  EKG   Radiology No results found.  Procedures Procedures (including critical care time)  Medications Ordered in UC Medications  ondansetron (ZOFRAN) injection 4 mg (has no administration in time range)    Initial Impression / Assessment and Plan / UC Course  I have reviewed the triage vital signs and the nursing notes.  Pertinent labs & imaging results that were available during my care of the patient were reviewed by me and considered in my medical decision making (see chart for details).    Currently he is  well-appearing without any diaphoresis.  Different treatments are sent in for the nausea.  He will present to the emergency room if he is not improving Tresiba sent to [facility] supply till he can see his endocrinologist and get his pump working again Final Clinical Impressions(s) / UC Diagnoses   Final diagnoses:  Gastroenteritis     Discharge Instructions      You have been given an injection of ondansetron for the nausea  Promethazine suppository 25 mg--put 1 suppository in your rectum every 6 hours as needed for nausea or vomiting.  The Tyler Aas is sent in for 25 units daily.  If you continue to vomit or cannot take in much fluids or food despite the medication, please present to the emergency room for more evaluation and treatment     ED Prescriptions     Medication Sig Dispense Auth. Provider   TRESIBA 100 UNIT/ML SOLN Inject 25 Units into the skin daily. 10 mL Barrett Henle, MD   Insulin Syringe-Needle U-100 (INSULIN SYRINGE .5CC/28G) 28G X 1/2" 0.5 ML MISC 1 injection daily 30 each Marnisha Stampley, Gwenlyn Perking, MD   promethazine (PHENERGAN) 25 MG suppository Place 1 suppository (25 mg total) rectally every 6 (six) hours as needed for nausea or vomiting. 5 each Windy Carina Gwenlyn Perking, MD      PDMP not reviewed this encounter.   Barrett Henle, MD 04/23/22 2020

## 2022-04-25 ENCOUNTER — Encounter (HOSPITAL_BASED_OUTPATIENT_CLINIC_OR_DEPARTMENT_OTHER): Payer: Self-pay

## 2022-04-25 ENCOUNTER — Emergency Department (HOSPITAL_BASED_OUTPATIENT_CLINIC_OR_DEPARTMENT_OTHER)
Admission: EM | Admit: 2022-04-25 | Discharge: 2022-04-25 | Disposition: A | Discharge status: Home / Self Care | Payer: BC Managed Care – PPO | Attending: Emergency Medicine | Admitting: Emergency Medicine

## 2022-04-25 ENCOUNTER — Other Ambulatory Visit: Payer: Self-pay

## 2022-04-25 DIAGNOSIS — E1065 Type 1 diabetes mellitus with hyperglycemia: Secondary | ICD-10-CM | POA: Diagnosis not present

## 2022-04-25 DIAGNOSIS — R059 Cough, unspecified: Secondary | ICD-10-CM | POA: Insufficient documentation

## 2022-04-25 DIAGNOSIS — E039 Hypothyroidism, unspecified: Secondary | ICD-10-CM | POA: Insufficient documentation

## 2022-04-25 DIAGNOSIS — Z20822 Contact with and (suspected) exposure to covid-19: Secondary | ICD-10-CM | POA: Insufficient documentation

## 2022-04-25 DIAGNOSIS — Z794 Long term (current) use of insulin: Secondary | ICD-10-CM | POA: Diagnosis not present

## 2022-04-25 DIAGNOSIS — R944 Abnormal results of kidney function studies: Secondary | ICD-10-CM | POA: Insufficient documentation

## 2022-04-25 DIAGNOSIS — R112 Nausea with vomiting, unspecified: Secondary | ICD-10-CM | POA: Diagnosis not present

## 2022-04-25 DIAGNOSIS — R739 Hyperglycemia, unspecified: Secondary | ICD-10-CM

## 2022-04-25 DIAGNOSIS — E1165 Type 2 diabetes mellitus with hyperglycemia: Secondary | ICD-10-CM | POA: Diagnosis not present

## 2022-04-25 LAB — CBC
HCT: 48.5 % (ref 39.0–52.0)
Hemoglobin: 16.9 g/dL (ref 13.0–17.0)
MCH: 30.8 pg (ref 26.0–34.0)
MCHC: 34.8 g/dL (ref 30.0–36.0)
MCV: 88.3 fL (ref 80.0–100.0)
Platelets: 301 10*3/uL (ref 150–400)
RBC: 5.49 MIL/uL (ref 4.22–5.81)
RDW: 13.2 % (ref 11.5–15.5)
WBC: 8.6 10*3/uL (ref 4.0–10.5)
nRBC: 0 % (ref 0.0–0.2)

## 2022-04-25 LAB — COMPREHENSIVE METABOLIC PANEL
ALT: 16 U/L (ref 0–44)
AST: 13 U/L — ABNORMAL LOW (ref 15–41)
Albumin: 4.6 g/dL (ref 3.5–5.0)
Alkaline Phosphatase: 79 U/L (ref 38–126)
Anion gap: 15 (ref 5–15)
BUN: 25 mg/dL — ABNORMAL HIGH (ref 6–20)
CO2: 22 mmol/L (ref 22–32)
Calcium: 9.8 mg/dL (ref 8.9–10.3)
Chloride: 86 mmol/L — ABNORMAL LOW (ref 98–111)
Creatinine, Ser: 1.42 mg/dL — ABNORMAL HIGH (ref 0.61–1.24)
GFR, Estimated: >60 mL/min (ref >60)
Glucose, Bld: 556 mg/dL (ref 70–99)
Potassium: 4.5 mmol/L (ref 3.5–5.1)
Sodium: 123 mmol/L — ABNORMAL LOW (ref 135–145)
Total Bilirubin: 1.1 mg/dL (ref 0.3–1.2)
Total Protein: 8 g/dL (ref 6.5–8.1)

## 2022-04-25 LAB — CBG MONITORING, ED
Glucose-Capillary: 536 mg/dL (ref 70–99)
Glucose-Capillary: 441 mg/dL — ABNORMAL HIGH (ref 70–99)
Glucose-Capillary: 376 mg/dL — ABNORMAL HIGH (ref 70–99)
Glucose-Capillary: 307 mg/dL — ABNORMAL HIGH (ref 70–99)

## 2022-04-25 LAB — URINALYSIS, ROUTINE W REFLEX MICROSCOPIC
Bacteria, UA: NONE SEEN
Bilirubin Urine: NEGATIVE
Glucose, UA: >1000 mg/dL — AB
Hgb urine dipstick: NEGATIVE
Ketones, ur: 40 mg/dL — AB
Leukocytes,Ua: NEGATIVE
Nitrite: NEGATIVE
Protein, ur: NEGATIVE mg/dL
Specific Gravity, Urine: 1.04 — ABNORMAL HIGH (ref 1.005–1.030)
pH: 6.5 (ref 5.0–8.0)

## 2022-04-25 LAB — RESP PANEL BY RT-PCR (RSV, FLU A&B, COVID)  RVPGX2
Influenza A by PCR: NEGATIVE
Influenza B by PCR: NEGATIVE
Resp Syncytial Virus by PCR: NEGATIVE
SARS Coronavirus 2 by RT PCR: NEGATIVE

## 2022-04-25 MED ORDER — LACTATED RINGERS IV BOLUS
1000.0000 mL | Freq: Once | INTRAVENOUS | Status: AC
  Administered 2022-04-25: 1000 mL via INTRAVENOUS

## 2022-04-25 MED ORDER — INSULIN REGULAR HUMAN 100 UNIT/ML IJ SOLN
10.0000 [IU] | Freq: Once | INTRAMUSCULAR | Status: AC
  Administered 2022-04-25: 10 [IU] via SUBCUTANEOUS
  Filled 2022-04-25: qty 3

## 2022-04-25 NOTE — ED Notes (Signed)
CRITICAL VALUE STICKER  CRITICAL VALUE:glucose 556  RECEIVER (on-site recipient of call):Moani Weipert Nikolai NOTIFIED: 04/25/2022 1343  MESSENGER (representative from lab):  MD NOTIFIED: Rex Kras ,PA  TIME OF NOTIFICATION:1344  RESPONSE:

## 2022-04-25 NOTE — Discharge Instructions (Addendum)
Please take your insulin as prescribed. Rest, stay hydrated. I recommend close follow-up with endocrinology or PCP for reevaluation.  Please do not hesitate to return to emergency department if worrisome signs symptoms we discussed become apparent.

## 2022-04-25 NOTE — ED Triage Notes (Signed)
Pt arrives POV from home with c/o generalized body aches, vomiting, cough, congestion, chills, but denies fevers for approximately one week.  Type 1 DM, CGB 536 upon arrival to ED.

## 2022-04-25 NOTE — ED Notes (Signed)
Respiratory notified of venous blood gas specimen collection

## 2022-04-26 ENCOUNTER — Telehealth: Payer: Self-pay | Admitting: Family Medicine

## 2022-04-26 NOTE — Telephone Encounter (Signed)
.  Transition Care Management Follow-up Telephone Call Date of discharge and from where: 04/25/2022 [facility] How have you been since you were released from the hospital? Pt states that he is still a little shaky  Any questions or concerns? Yes pt state he has been issues with is insulin pump for several weeks is is schedule this week to have that issue addressed   Items Reviewed: Did the pt receive and understand the discharge instructions provided? Yes  Medications obtained and verified? Yes pt states he did not receive any medications at discharge Other? No  Any new allergies since your discharge? No  Dietary orders reviewed? No Do you have support at home? Yes   Home Care and Equipment/Supplies: Were home health services ordered? not applicable  Follow up appointments reviewed:  PCP Hospital f/u appt confirmed? Yes  Scheduled to see Dr. Tomi Bamberger on 04/29/2022  @ 4:00. [facility] f/u appt confirmed? Yes  Scheduled to see Dr. Buddy Duty on 04/28/2022 @ 4:00. Are transportation arrangements needed? No  If their condition worsens, is the pt aware to call PCP or go to the Emergency Dept.? Yes Was the patient provided with contact information for the PCP's office or ED? Yes Was to pt encouraged to call back with questions or concerns? Yes

## 2022-04-26 NOTE — ED Provider Notes (Signed)
[facility] EMERGENCY DEPT Provider Note   CSN: [id number] Arrival date & time: 04/25/22  1151     History  Chief Complaint  Patient presents with   Generalized Body Aches   Emesis    Alejandro Griffin is a 42 y.o. male with a PMH of DM type 1, GERD, hyperlipidemia, hypothyroidism presenting to the emergency department for evaluation of generalized body aches, cough, nausea, vomiting, congestion, chills for a week. He reports difficulty holding any food down. He reports using Novalog before meal, last dose was 10 units this morning. He reports use of long acting insulin once a day. No fever, chest pain, shortness of breath, abdominal pain, bowel changes, urinary symptoms. He has an appointment with endocrinology on Jan 10.   Emesis   Past Medical History:  Diagnosis Date   Cough    Diabetes mellitus without complication ([place])    Dyspnea    Gastritis 2016   confirmed on bx on EGD. Dr. Hilarie Fredrickson   GERD (gastroesophageal reflux disease)    Hyperlipidemia    Hypothyroidism    Inguinal hernia    RT   Low testosterone 2016   better on repeat in 2017   Obesity    Sarcoidosis    Vitamin D deficiency    Past Surgical History:  Procedure Laterality Date   HERNIA REPAIR     INGUINAL HERNIA REPAIR Right 02/20/2015   Procedure: RIGHT INGUINAL HERNIA REPAIR WITH MESH;  Surgeon: Donnie Mesa, MD;  Location: [facility];  Service: General;  Laterality: Right;   INSERTION OF MESH Right 02/20/2015   Procedure: INSERTION OF MESH;  Surgeon: Donnie Mesa, MD;  Location: [facility];  Service: General;  Laterality: Right;   MEDIASTINOSCOPY N/A 01/13/2017   Procedure: MEDIASTINOSCOPY;  Surgeon: Melrose Nakayama, MD;  Location: [facility];  Service: Thoracic;  Laterality: N/A;   WISDOM TOOTH EXTRACTION       Home Medications Prior to Admission medications   Medication Sig Start Date End Date Taking? Authorizing Provider  atorvastatin (LIPITOR) 80 MG tablet Take 1 tablet (80 mg total)  by mouth daily. 03/30/21   Rita Ohara, MD  Continuous Blood Gluc Transmit (DEXCOM G6 TRANSMITTER) MISC See admin instructions. 09/11/21   [provider]  insulin aspart (NOVOLOG) 100 UNIT/ML injection Inject into the skin See admin instructions. Via Omnipod insulin pump, continuously    [provider]  Insulin Syringe-Needle U-100 (INSULIN SYRINGE .5CC/28G) 28G X 1/2" 0.5 ML MISC 1 injection daily 04/23/22   Barrett Henle, MD  levothyroxine (SYNTHROID) 300 MCG tablet Take 300 mcg by mouth daily. 03/01/22   [provider]  liothyronine (CYTOMEL) 5 MCG tablet Take 5 mcg by mouth daily. 09/09/21   [provider]  NON FORMULARY dOTerra Lifelong Vitality 3-pack: Take 3 tablets from each of the three bottles once a day at bedtime    [provider]  promethazine (PHENERGAN) 25 MG suppository Place 1 suppository (25 mg total) rectally every 6 (six) hours as needed for nausea or vomiting. 04/23/22   Barrett Henle, MD  tadalafil (CIALIS) 5 MG tablet Take 1 tablet (5 mg total) by mouth daily. 03/04/22   Rita Ohara, MD  TRESIBA 100 UNIT/ML SOLN Inject 25 Units into the skin daily. 04/23/22   Barrett Henle, MD      Allergies    Nsaids    Review of Systems   Review of Systems  Gastrointestinal:  Positive for vomiting.    Physical Exam Updated Vital  Signs BP 119/85 (BP Location: Right Arm)   Pulse 84   Temp 98.2 F (36.8 C) (Oral)   Resp 18   Ht 5' 7.5" (1.715 m)   Wt 90.7 kg   SpO2 98%   BMI 30.86 kg/m  Physical Exam Vitals and nursing note reviewed.  Constitutional:      Appearance: Normal appearance. He is ill-appearing.  HENT:     Head: Normocephalic and atraumatic.     Mouth/Throat:     Mouth: Mucous membranes are moist.  Eyes:     General: No scleral icterus. Cardiovascular:     Rate and Rhythm: Normal rate and regular rhythm.     Pulses: Normal pulses.     Heart sounds: Normal heart sounds.  Pulmonary:     Effort:  Pulmonary effort is normal.     Breath sounds: Normal breath sounds.  Abdominal:     General: Abdomen is flat.     Palpations: Abdomen is soft.     Tenderness: There is no abdominal tenderness.  Musculoskeletal:        General: No deformity.  Skin:    General: Skin is warm.     Findings: No rash.  Neurological:     General: No focal deficit present.     Mental Status: He is alert.  Psychiatric:        Mood and Affect: Mood normal.     ED Results / Procedures / Treatments   Labs (all labs ordered are listed, but only abnormal results are displayed) Labs Reviewed  COMPREHENSIVE METABOLIC PANEL - Abnormal; Notable for the following components:      Result Value   Sodium 123 (*)    Chloride 86 (*)    Glucose, Bld 556 (*)    BUN 25 (*)    Creatinine, Ser 1.42 (*)    AST 13 (*)    All other components within normal limits  URINALYSIS, ROUTINE W REFLEX MICROSCOPIC - Abnormal; Notable for the following components:   Specific Gravity, Urine 1.040 (*)    Glucose, UA >1,000 (*)    Ketones, ur 40 (*)    All other components within normal limits  CBG MONITORING, ED - Abnormal; Notable for the following components:   Glucose-Capillary 536 (*)    All other components within normal limits  CBG MONITORING, ED - Abnormal; Notable for the following components:   Glucose-Capillary 441 (*)    All other components within normal limits  CBG MONITORING, ED - Abnormal; Notable for the following components:   Glucose-Capillary 376 (*)    All other components within normal limits  CBG MONITORING, ED - Abnormal; Notable for the following components:   Glucose-Capillary 307 (*)    All other components within normal limits  RESP PANEL BY RT-PCR (RSV, FLU A&B, COVID)  RVPGX2  CBC    EKG None  Radiology No results found.  Procedures Procedures    Medications Ordered in ED Medications  lactated ringers bolus 1,000 mL (0 mLs Intravenous Stopped 04/25/22 1426)  lactated ringers bolus  1,000 mL (0 mLs Intravenous Stopped 04/25/22 1853)  insulin regular (NOVOLIN R) 100 units/mL injection 10 Units (10 Units Subcutaneous Given 04/25/22 1722)  lactated ringers bolus 1,000 mL (0 mLs Intravenous Stopped 04/25/22 1723)    ED Course/ Medical Decision Making/ A&P                           Medical Decision Making Amount and/or Complexity  of Data Reviewed Labs: ordered.  Risk OTC drugs.   This patient presents to the ED for fatique, nausea, vomiting, , this involves an extensive number of treatment options, and is a complaint that carries with a high risk of complications and morbidity.  The differential diagnosis includes small bowel obstruction, acute gastroenteritis, appendicitis, gallbladder/biliary, pancreatitis, peptic ulcer disease, perforation, increased ICP meningitis, vertigo, DKA, EtOH intoxication, cannabinoid hyperemesis. This is not an exhaustive list.  Lab tests: I ordered and personally interpreted labs.  The pertinent results include: WBC unremarkable. Hbg unremarkable. Platelets unremarkable. Cmp with glucose >500 BUN 25, creatinine 1.42. UA significant for +ketone  Imaging studies:  Problem list/ ED course/ Critical interventions/ Medical management: HPI: See above Vital signs within normal range and stable throughout visit. Laboratory/imaging studies significant for: See above. On physical examination, patient is afebrile and appears in no acute distress. This patient presenting with apparent acute hyperglycemia. Considered DKA versus HHS, sepsis as possible etiologies of the patient's current presentation. However, given the current history & physical, including current lab values, the current presentation is consistent with acute hyperglycemia with no signs of DKA or HHS. Patient non toxic appearing with no signs of infection or ischemia. Based on patient's clinical presentations and laboratory/imaging studies I suspect hyperglycemia and AKI. Patient was given  3L of LR and 10 units of regular insulin. Reevaluation of the patient after these medications showed that the patient improved. He reports much improvement after fluid and insulin. He is able to tolerate po at this point. Patient will follow up with his endocrinology on Jan 10 (already had appointment) for better sugar control. Pt is discharged in stable conditions with strict return precaution.  I have reviewed the patient home medicines and have made adjustments as needed.  Cardiac monitoring/EKG: The patient was maintained on a cardiac monitor.  I personally reviewed and interpreted the cardiac monitor which showed an underlying rhythm of: sinus rhythm.  Additional history obtained: External records from outside source obtained and reviewed including: Chart review including previous notes, labs, imaging.  Consultations obtained: I requested consultation with Dr. Jeanell Sparrow, and discussed lab and imaging findings as well as pertinent plan.  Disposition Continued outpatient therapy. Follow-up with PCP and endocrinology recommended for reevaluation of symptoms. Treatment plan discussed with patient.  Pt acknowledged understanding was agreeable to the plan. Worrisome signs and symptoms were discussed with patient, and patient acknowledged understanding to return to the ED if they noticed these signs and symptoms. Patient was stable upon discharge.   This chart was dictated using voice recognition software.  Despite best efforts to proofread,  errors can occur which can change the documentation meaning.          Final Clinical Impression(s) / ED Diagnoses Final diagnoses:  Hyperglycemia    Rx / DC Orders ED Discharge Orders     None         Rex Kras, PA 04/26/22 7425    Pattricia Boss, MD 04/27/22 1113

## 2022-04-28 DIAGNOSIS — E1065 Type 1 diabetes mellitus with hyperglycemia: Secondary | ICD-10-CM | POA: Diagnosis not present

## 2022-04-28 DIAGNOSIS — E063 Autoimmune thyroiditis: Secondary | ICD-10-CM | POA: Diagnosis not present

## 2022-04-28 DIAGNOSIS — E039 Hypothyroidism, unspecified: Secondary | ICD-10-CM | POA: Diagnosis not present

## 2022-04-28 DIAGNOSIS — Z794 Long term (current) use of insulin: Secondary | ICD-10-CM | POA: Diagnosis not present

## 2022-04-28 NOTE — Progress Notes (Unsigned)
No chief complaint on file.  Patient started vomiting 1/3, went to UC 1/5. He was treated with zofran, and prescribed phenergan suppositories. He was having trouble with his insulin pump, and Tyler Aas was supplied until he could see his endo. He went to the ER on 1/7 with ongoing vomiting, body aches, congestion, weakness, hyperglycemia. He had AKI.  He was treated with 3 liters of LR, given 10 U of insulin, and patient was improved after these measures.  Labs included: Negative respiratory panel for flu, COVID and RSV  Component Ref Range & Units 3 d ago (04/25/22) 3 d ago (04/25/22) 3 d ago (04/25/22) 4 yr ago (10/15/17) 5 yr ago (01/13/17) 5 yr ago (01/13/17) 5 yr ago (01/13/17)  Glucose-Capillary 70 - 99 mg/dL 376 High  441 High  CM 536 High Panic        Component Ref Range & Units 3 d ago     Color, Urine YELLOW YELLOW     APPearance CLEAR CLEAR     Specific Gravity, Urine 1.005 - 1.030 1.040 High      pH 5.0 - 8.0 6.5     Glucose, UA NEGATIVE mg/dL >1,000 Abnormal      Hgb urine dipstick NEGATIVE NEGATIVE     Bilirubin Urine NEGATIVE NEGATIVE     Ketones, ur NEGATIVE mg/dL 40 Abnormal      Protein, ur NEGATIVE mg/dL NEGATIVE     Nitrite NEGATIVE NEGATIVE     Leukocytes,Ua NEGATIVE NEGATIVE     RBC / HPF 0 - 5 RBC/hpf 0-5     WBC, UA 0 - 5 WBC/hpf 0-5     Bacteria, UA NONE SEEN NONE SEEN     Squamous Epithelial / HPF 0 - 5 /HPF 0-5         Chemistry      Component Value Date/Time   NA 123 (L) 04/25/2022 1251   NA 137 07/07/2020 1018   K 4.5 04/25/2022 1251   CL 86 (L) 04/25/2022 1251   CO2 22 04/25/2022 1251   BUN 25 (H) 04/25/2022 1251   BUN 16 07/07/2020 1018   CREATININE 1.42 (H) 04/25/2022 1251   CREATININE 1.18 12/13/2016 0823      Component Value Date/Time   CALCIUM 9.8 04/25/2022 1251   ALKPHOS 79 04/25/2022 1251   AST 13 (L) 04/25/2022 1251   ALT 16 04/25/2022 1251   BILITOT 1.1 04/25/2022 1251   BILITOT 0.4 07/07/2020 1018     Lab Results  Component Value  Date   WBC 8.6 04/25/2022   HGB 16.9 04/25/2022   HCT 48.5 04/25/2022   MCV 88.3 04/25/2022   PLT 301 04/25/2022   He had contacted [place] on 1/4 asking about reflux medications--he declined to schedule a visit. Chart reviewed--he used to take famotidine (pepcid), and was noted that it was stopped because he no longer needed it.  He was advised that he could find this over-the-counter. Or he could try Prilosec OTC and see if that helps, and if it doesn't needs a visit (not to take longer than 2 weeks without being evaluated). He was advised to avoid all NSAIDs.   Diabetes: He saw Dr. Buddy Duty yesterday    PMH, [place], [place] reviewed   ROS:    PHYSICAL EXAM:  There were no vitals taken for this visit.  Wt Readings from Last 3 Encounters:  04/25/22 200 lb (90.7 kg)  03/04/22 201 lb 3.2 oz (91.3 kg)  09/30/21 219 lb (99.3 kg)  ASSESSMENT/PLAN:  He saw Dr. Buddy Duty yesterday.  A1c done? Pump fixed?? (Was having issues) Any repeat labs done?

## 2022-04-29 ENCOUNTER — Encounter: Payer: Self-pay | Admitting: Family Medicine

## 2022-04-29 ENCOUNTER — Ambulatory Visit: Payer: BC Managed Care – PPO | Admitting: Family Medicine

## 2022-04-29 VITALS — BP 110/80 | HR 92 | Ht 67.5 in | Wt 204.0 lb

## 2022-04-29 DIAGNOSIS — K219 Gastro-esophageal reflux disease without esophagitis: Secondary | ICD-10-CM

## 2022-04-29 DIAGNOSIS — E1065 Type 1 diabetes mellitus with hyperglycemia: Secondary | ICD-10-CM | POA: Diagnosis not present

## 2022-04-29 NOTE — Patient Instructions (Signed)
I'm glad you're feeling so much better. Use Pepcid AC prior to eating foods that you know will trigger your belching or reflux symptoms.  You may use famotidine (pepcid) twice daily, if needed. Try and limit the triggering foods (citrus, caffeine, tomatoes, etc).  It is important to get your sugars under control.  Consider checking with your insurance to see if there are more affordable medications, vs checking with the medications (their websites) to see if there are savings cards. Check with Dexcom to see if they have any coupons or can advise of any help with cost.

## 2022-05-04 ENCOUNTER — Ambulatory Visit (INDEPENDENT_AMBULATORY_CARE_PROVIDER_SITE_OTHER): Payer: BC Managed Care – PPO

## 2022-05-04 ENCOUNTER — Encounter (HOSPITAL_BASED_OUTPATIENT_CLINIC_OR_DEPARTMENT_OTHER): Payer: Self-pay | Admitting: Pulmonary Disease

## 2022-05-04 ENCOUNTER — Ambulatory Visit (INDEPENDENT_AMBULATORY_CARE_PROVIDER_SITE_OTHER): Payer: Self-pay | Admitting: Pulmonary Disease

## 2022-05-04 VITALS — BP 116/72 | HR 94 | Temp 97.9°F | Ht 67.5 in | Wt 213.2 lb

## 2022-05-04 DIAGNOSIS — D869 Sarcoidosis, unspecified: Secondary | ICD-10-CM

## 2022-05-04 MED ORDER — PREDNISONE 10 MG PO TABS: ORAL_TABLET | ORAL | 0 refills | Status: AC

## 2022-05-04 NOTE — Patient Instructions (Signed)
X chest x-ray for sarcoidosis  Trial OTC Delsym 5 mL twice daily as needed for cough for 1 week  X Prednisone 10 mg tabs  Take 2 tabs daily with food x 5ds, then 1 tab daily with food x 5ds then STOP Take prednisone if no better in 1 week   X schedule PFTs

## 2022-05-04 NOTE — Assessment & Plan Note (Signed)
Cough could be related to sinus drip, no sick contacts or URI symptoms. We will do basic workup to rule out sarcoid flare.  Will try to avoid prednisone due to elevated sugars unless absolutely necessary  chest x-ray for sarcoidosis  Trial OTC Delsym 5 mL twice daily as needed for cough for 1 week  X Prednisone 10 mg tabs  Take 2 tabs daily with food x 5ds, then 1 tab daily with food x 5ds then STOP Take prednisone if no better in 1 week   X schedule PFTs

## 2022-05-04 NOTE — Progress Notes (Signed)
   Subjective:    Patient ID: Alejandro Griffin, male    DOB: January 04, 1981, 42 y.o.   MRN: [id number]  HPI  42 yo type I diabetic, never smoker For follow-up of sarcoidosis   Presented with mediastinal lymphadenopathy in 2018    01/13/17 underwent mediastinoscopy biopsy of 4R lymph node which showed noncaseating granulomas, AFB and fungal stains are negative He was treated with prednisone for 4-5 months  Chief Complaint  Patient presents with   Acute Visit    Pt states that he is checking up on his sarcoidosis. Pt states he has a dry cough that started about 1 month ago.   Last seen by me in 2019, last seen by APP 01/2020. He has a dry cough for a month and is worried that sarcoidosis was coming back. No eye symptoms or skin lesions. He works as an Clinical biochemist for [employer]. He reports high sugars more than 400 but now that he is started back on his pump, sugars are coming down He had a similar episode of cough few years ago and required a prednisone course   Significant tests/ events reviewed  CT angiogram 09/2017 -resolution of infiltrates and mediastinal lymphadenopathy.  CT chest  12/14/16 - mediastinal and bilateral hilar lymphadenopathy and lymph nodes in the abdomen. There is an area of consolidation in the right lung base and other areas of groundglass opacification.   ACE level was 18   PFTS 02/2017 FEV1 93%, ratio 87, FVC 86%.  DLCO 98%.    Review of Systems neg for any significant sore throat, dysphagia, itching, sneezing, nasal congestion or excess/ purulent secretions, fever, chills, sweats, unintended wt loss, pleuritic or exertional cp, hempoptysis, orthopnea pnd or change in chronic leg swelling. Also denies presyncope, palpitations, heartburn, abdominal pain, nausea, vomiting, diarrhea or change in bowel or urinary habits, dysuria,hematuria, rash, arthralgias, visual complaints, headache, numbness weakness or ataxia.     Objective:   Physical Exam  Gen.  Pleasant, obese, in no distress ENT - no lesions, no post nasal drip Neck: No JVD, no thyromegaly, no carotid bruits Lungs: no use of accessory muscles, no dullness to percussion, decreased without rales or rhonchi  Cardiovascular: Rhythm regular, heart sounds  normal, no murmurs or gallops, no peripheral edema Musculoskeletal: No deformities, no cyanosis or clubbing , no tremors        Assessment & Plan:

## 2022-05-06 ENCOUNTER — Telehealth (HOSPITAL_BASED_OUTPATIENT_CLINIC_OR_DEPARTMENT_OTHER): Payer: Self-pay

## 2022-05-06 NOTE — Telephone Encounter (Signed)
-----  Message from Rigoberto Noel, MD sent at 05/06/2022 10:55 AM EST ----- Chest x-ray appears clear. No evidence of recurrence of sarcoid

## 2022-05-06 NOTE — Telephone Encounter (Signed)
Called Pt and gave results of chest xray. Pt stated understanding and nothing further needed at this time.

## 2022-05-13 ENCOUNTER — Ambulatory Visit (INDEPENDENT_AMBULATORY_CARE_PROVIDER_SITE_OTHER): Payer: BC Managed Care – PPO | Admitting: Pulmonary Disease

## 2022-05-13 DIAGNOSIS — D869 Sarcoidosis, unspecified: Secondary | ICD-10-CM | POA: Diagnosis not present

## 2022-05-13 LAB — PULMONARY FUNCTION TEST
DL/VA % pred: 106 %
DL/VA: 4.92 ml/min/mmHg/L
DLCO cor % pred: 90 %
DLCO cor: 25.68 ml/min/mmHg
DLCO unc % pred: 96 %
DLCO unc: 27.21 ml/min/mmHg
FEF 25-75 Post: 2.4 L/sec
FEF 25-75 Pre: 2.58 L/sec
FEF2575-%Change-Post: -6 %
FEF2575-%Pred-Post: 66 %
FEF2575-%Pred-Pre: 71 %
FEV1-%Change-Post: -3 %
FEV1-%Pred-Post: 73 %
FEV1-%Pred-Pre: 76 %
FEV1-Post: 2.8 L
FEV1-Pre: 2.9 L
FEV1FVC-%Change-Post: 0 %
FEV1FVC-%Pred-Pre: 99 %
FEV6-%Change-Post: -3 %
FEV6-%Pred-Post: 75 %
FEV6-%Pred-Pre: 78 %
FEV6-Post: 3.55 L
FEV6-Pre: 3.68 L
FEV6FVC-%Pred-Post: 102 %
FEV6FVC-%Pred-Pre: 102 %
FVC-%Change-Post: -4 %
FVC-%Pred-Post: 73 %
FVC-%Pred-Pre: 76 %
FVC-Post: 3.55 L
FVC-Pre: 3.7 L
Post FEV1/FVC ratio: 79 %
Post FEV6/FVC ratio: 100 %
Pre FEV1/FVC ratio: 78 %
Pre FEV6/FVC Ratio: 100 %
RV % pred: 105 %
RV: 1.86 L
TLC % pred: 89 %
TLC: 5.74 L

## 2022-05-13 NOTE — Patient Instructions (Signed)
Performed Full PFT Today.   ?

## 2022-05-13 NOTE — Progress Notes (Signed)
Performed Full PFT Today.   ?

## 2022-06-02 ENCOUNTER — Encounter: Payer: Self-pay | Admitting: Family Medicine

## 2022-12-09 ENCOUNTER — Encounter: Payer: Self-pay | Admitting: Family Medicine

## 2022-12-13 ENCOUNTER — Ambulatory Visit: Payer: BC Managed Care – PPO | Admitting: Family Medicine

## 2022-12-21 DIAGNOSIS — Z794 Long term (current) use of insulin: Secondary | ICD-10-CM | POA: Diagnosis not present

## 2022-12-21 DIAGNOSIS — E1065 Type 1 diabetes mellitus with hyperglycemia: Secondary | ICD-10-CM | POA: Diagnosis not present

## 2022-12-21 DIAGNOSIS — E063 Autoimmune thyroiditis: Secondary | ICD-10-CM | POA: Diagnosis not present

## 2022-12-21 DIAGNOSIS — E039 Hypothyroidism, unspecified: Secondary | ICD-10-CM | POA: Diagnosis not present

## 2022-12-21 LAB — HEMOGLOBIN A1C: Hemoglobin A1C: 9.3

## 2022-12-21 LAB — PROTEIN / CREATININE RATIO, URINE: Creatinine, Urine: 139

## 2022-12-21 LAB — TSH: TSH: 40.67 — AB (ref 0.41–5.90)

## 2022-12-21 LAB — MICROALBUMIN / CREATININE URINE RATIO: Microalb Creat Ratio: <5

## 2022-12-21 LAB — HM DIABETES FOOT EXAM: HM Diabetic Foot Exam: NORMAL

## 2022-12-21 LAB — COMPREHENSIVE METABOLIC PANEL: eGFR: 84

## 2022-12-21 LAB — MICROALBUMIN, URINE: Microalb, Ur: <0.7

## 2022-12-21 LAB — BASIC METABOLIC PANEL: Creatinine: 1.1 (ref 0.6–1.3)

## 2022-12-30 ENCOUNTER — Encounter: Payer: Self-pay | Admitting: *Deleted

## 2023-01-03 ENCOUNTER — Encounter: Payer: Self-pay | Admitting: Family Medicine

## 2023-01-03 ENCOUNTER — Ambulatory Visit: Payer: BC Managed Care – PPO | Admitting: Family Medicine

## 2023-01-03 VITALS — BP 120/80 | HR 88 | Ht 67.5 in | Wt 216.2 lb

## 2023-01-03 DIAGNOSIS — E039 Hypothyroidism, unspecified: Secondary | ICD-10-CM | POA: Diagnosis not present

## 2023-01-03 DIAGNOSIS — K219 Gastro-esophageal reflux disease without esophagitis: Secondary | ICD-10-CM | POA: Diagnosis not present

## 2023-01-03 DIAGNOSIS — E559 Vitamin D deficiency, unspecified: Secondary | ICD-10-CM

## 2023-01-03 DIAGNOSIS — E1065 Type 1 diabetes mellitus with hyperglycemia: Secondary | ICD-10-CM

## 2023-01-03 DIAGNOSIS — Z7185 Encounter for immunization safety counseling: Secondary | ICD-10-CM

## 2023-01-03 DIAGNOSIS — R1111 Vomiting without nausea: Secondary | ICD-10-CM | POA: Diagnosis not present

## 2023-01-03 MED ORDER — PANTOPRAZOLE SODIUM 40 MG PO TBEC: 40.0000 mg | DELAYED_RELEASE_TABLET | Freq: Every day | ORAL | 2 refills | Status: DC

## 2023-01-03 NOTE — Patient Instructions (Addendum)
I suspect that you have gastroesophageal reflux, and that the following recommendations will help.  Eat small, frequent meals. Wait at least 2 hours after eating before reclining or laying down. Consider elevating the head of your bed.  Restart taking pantoprazole--since your symptoms are mainly night/morning, start taking it about 30 minutes prior to eating dinner (if you can't remember, take it WITH dinner). If you can't remember to take it then, you can switch back to taking it before breakfast, as you used to take it.  Avoid the foods as per the handout (caffeine, alcohol ,tomatoes, spicy foods, citrus, etc).  Send [place] a message within 2 weeks to let us know if you are improving.  This might help your chronic cough also--since your pulmonary doctor said it wasn't the sarcoidosis, it may also be related to reflux.  There are other diagnoses for what could be causing your vomiting as well, that might not respond to this medication. Gastroparesis is a diagnosis (not treated with this medication), and poorly controlled diabetes can contribute to this.  Please work hard to keep your sugars controlled, and take your medicines regularly on the weekends also.  I highly encourage you to get a flu shot and COVID vaccine.

## 2023-01-03 NOTE — Progress Notes (Signed)
Chief Complaint  Patient presents with   Emesis    Gagging/vomiting in the morning for the last few months, esp if he eats late at night. He starts gagging and vomits. Sometimes he wakes in the middle of the night of vomits what he had for dinner. Sometimes blood in the vomit. Has had stomach ulcers in the past.    other    Was asking if we could do a CPE today, needs one by 9/30. I told him most likely not but I would ask if there was any place we could fit him in, he is already on my cx list.    For about a couple of months, he has been waking up gagging and throwing up. Worse when he eats later.  Sometimes occurs on the weekends, always occurs Mon through Friday. Sometimes it flares later in the day. Denies heartburn or belching, just comes on out of nowhere. Sometimes it is clear fluid, sometimes it is food.  Bowels are normal, no melena, hematochezia, constipation or diarrhea. Denies any abdominal pain or nausea. No heartburn or belching.  He has h/o gastric ulcers, took pantoprazole for a while, none in 3-4 years.  +cough, chronic dry cough. He has h/o sarcoidosis.  He reports he saw pulm, told "nothing there", lungs fine.  DM--sugars have been high. Denies polydipsia, polyuria.  Hypoglycemia up to 2x/day, down to 50's.  Alarms work, he pauses pump and eats.  Under the care of Dr. Sharl Ma, notes recently reviewed.  Hypothyroid--recent labs per Dr. Daune Perch notes showed high TSH.  He reports missing doses on the weekends HLD--also misses statin doses on the weekends Low D--was out of Duterra vitamins, recently restarted them, not taking separate D3 in addition (as has been recommended in the past)   PMH, PSH, SH reviewed  Outpatient Encounter Medications as of 01/03/2023  Medication Sig Note   atorvastatin (LIPITOR) 80 MG tablet Take 1 tablet (80 mg total) by mouth daily.    Continuous Blood Gluc Transmit (DEXCOM G6 TRANSMITTER) MISC See admin instructions.    insulin aspart (NOVOLOG)  100 UNIT/ML injection Inject into the skin See admin instructions. Via Omnipod insulin pump, continuously    Insulin Syringe-Needle U-100 (INSULIN SYRINGE .5CC/28G) 28G X 1/2" 0.5 ML MISC 1 injection daily    levothyroxine (SYNTHROID) 300 MCG tablet Take 300 mcg by mouth daily.    liothyronine (CYTOMEL) 5 MCG tablet Take 10 mcg by mouth daily.    NON FORMULARY dOTerra Lifelong Vitality 3-pack: Take 3 tablets from each of the three bottles once a day at bedtime    pantoprazole (PROTONIX) 40 MG tablet Take 1 tablet (40 mg total) by mouth daily.    tadalafil (CIALIS) 5 MG tablet Take 1 tablet (5 mg total) by mouth daily. (Patient not taking: Reported on 01/03/2023)    [DISCONTINUED] promethazine (PHENERGAN) 25 MG suppository Place 1 suppository (25 mg total) rectally every 6 (six) hours as needed for nausea or vomiting. (Patient not taking: Reported on 01/03/2023) 04/29/2022: prn   [DISCONTINUED] TRESIBA 100 UNIT/ML SOLN Inject 25 Units into the skin daily.    No facility-administered encounter medications on file as of 01/03/2023.   NOT taking pantoprazole prior to today's visit  Allergies  Allergen Reactions   Nsaids Other (See Comments)    HAS ULCERS AND CAN TOLERATE ONLY TYLENOL    ROS: no fever, chills, URI symptoms. No vertigo, no allergy symptoms.  No nausea.  +vomiting per HPI. Chronic cough, nonproductive. No shortness of breath.  No abdominal pain, melena, hematochezia. No urinary complaints. No rashes or other concerns.   PHYSICAL EXAM:  BP 120/80   Pulse 88   Ht 5' 7.5" (1.715 m)   Wt 216 lb 3.2 oz (98.1 kg)   BMI 33.36 kg/m   Well-appearing, pleasant, overweight male in no distress HEENT: conjunctiva and sclera are clear, EOMI OP clear, no lesions Neck: no lymphadenopathy or mass Heart: regular rate and rhythm Lungs: clear bilaterally Back: no spinal or CVA tenderness Abdomen: soft, nontender, no organomegaly or mass. No epigastric tenderness Extremities: no  edema Psych: normal mood, affect, hygiene and grooming Neuro: alert and oriented, cranial nerves grossly intact, normal gait.   ASSESSMENT/PLAN:   Vomiting without nausea, unspecified vomiting type - Suspect related to GERD, given occuring in the morning, worse with later meals, and dry cough. Gastroparesis in Ddx, poorly controlled diabetes  Gastroesophageal reflux disease, unspecified whether esophagitis present - restart pantaoprazole. Proper diet and behavioral measures reviewed in detail.  f/u if sx persist/worsen - Plan: pantoprazole (PROTONIX) 40 MG tablet  Uncontrolled type 1 diabetes mellitus with hyperglycemia (HCC) - encouraged proper diet and med compliance  Hypothyroidism, unspecified type - encouraged daily intake of his meds on weekends/every day  Vitamin D deficiency - encouraged to take a separate D3 supplement daily, in addition to his Duterra vitamins.  Vaccine counseling - strongly encouraged flu shot and COVID booster. Declined both today. Encouraged to get this Fall (from [place] or [place] at our office)   Unable to accommodate doing CPE today; to be put on cancellation list. To schedule for next available CPE, will try to get in sooner if possible, but not likely prior to 9/30 without cancellation.  I spent 34 minutes dedicated to the care of this patient, including pre-visit review of records, face to face time, post-visit ordering of testing and documentation.

## 2023-01-05 ENCOUNTER — Encounter: Payer: Self-pay | Admitting: Family Medicine

## 2023-01-06 ENCOUNTER — Encounter: Payer: Self-pay | Admitting: Family Medicine

## 2023-01-06 ENCOUNTER — Ambulatory Visit (INDEPENDENT_AMBULATORY_CARE_PROVIDER_SITE_OTHER): Payer: BC Managed Care – PPO | Admitting: Family Medicine

## 2023-01-06 ENCOUNTER — Encounter: Payer: Self-pay | Admitting: *Deleted

## 2023-01-06 VITALS — BP 112/70 | HR 80 | Ht 68.5 in | Wt 217.2 lb

## 2023-01-06 DIAGNOSIS — E782 Mixed hyperlipidemia: Secondary | ICD-10-CM | POA: Diagnosis not present

## 2023-01-06 DIAGNOSIS — E559 Vitamin D deficiency, unspecified: Secondary | ICD-10-CM

## 2023-01-06 DIAGNOSIS — E1065 Type 1 diabetes mellitus with hyperglycemia: Secondary | ICD-10-CM

## 2023-01-06 DIAGNOSIS — K219 Gastro-esophageal reflux disease without esophagitis: Secondary | ICD-10-CM | POA: Diagnosis not present

## 2023-01-06 DIAGNOSIS — Z0000 Encounter for general adult medical examination without abnormal findings: Secondary | ICD-10-CM | POA: Diagnosis not present

## 2023-01-06 DIAGNOSIS — Z23 Encounter for immunization: Secondary | ICD-10-CM | POA: Diagnosis not present

## 2023-01-06 DIAGNOSIS — E039 Hypothyroidism, unspecified: Secondary | ICD-10-CM | POA: Diagnosis not present

## 2023-01-06 LAB — POCT URINALYSIS DIP (PROADVANTAGE DEVICE)
Blood, UA: NEGATIVE — AB
Glucose, UA: NEGATIVE mg/dL
Leukocytes, UA: NEGATIVE
Nitrite, UA: NEGATIVE
Protein Ur, POC: NEGATIVE mg/dL
Specific Gravity, Urine: 1.015
Urobilinogen, Ur: 0.2
pH, UA: 6 (ref 5.0–8.0)

## 2023-01-06 LAB — LIPID PANEL

## 2023-01-06 MED ORDER — ATORVASTATIN CALCIUM 80 MG PO TABS: 80.0000 mg | ORAL_TABLET | Freq: Every day | ORAL | 3 refills | Status: DC

## 2023-01-06 NOTE — Progress Notes (Signed)
Chief Complaint  Patient presents with   Annual Exam    Fasting annual exam. Dr. Eugenie Norrie office called him yesterday to schedule eye exam-he will call them back to schedule.     Alejandro Griffin is a 42 y.o. male who presents for a complete physical.    Seen earlier this week with vomiting and gagging in the mornings for months.  Sudden onset, no associated nausea or heartburn, no bowel changes.  GERD was suspected. History of gastritis. He used to take pantoprazole chronically.  He stopped it years ago and had done well without heartburn, reflux or indigestion, until the vomiting started as per recent visit. Pantoprazole was restarted a few days ago. No gagging or vomiting the last couple of days. No change in dry cough noted yet.   Sarcoidosis:  Had gone into remission. Went back to pulmonary when he had recurrent cough in January. CXR and PFTs were done, was told it wasn't related to sarcoidosis. Has nonproductive cough. Denies shortness of breath.  Type 1 diabetes: under the care of Dr. Sharl Ma. This hasn't been well controlled. A1c was 9.3% earlier this month (improved from prior check, 16.1). Sugars have been high. Denies polydipsia, polyuria. Hypoglycemia up to 2x/day, down to 50's.  Only one low sugar yesterday, trying to be more careful. He adjusted the basal rate. Can't tolerate aspartame in diet sodas, caused vertigo. Still drinks regular soda.   Hypothyroidism: Managed by Dr. Sharl Ma. TSH has been very high related to noncompliance, last was 40.67 earlier this month (was 115 on prior check).  Managed by Dr. Sharl Ma. Dose was changed to 300 mcg daily after the 115 TSH, no recent change made, just encouraged to take his medications more regularly. Doesn't remember to take meds on weekends. Denies changes to skin, hair, nails, bowels, energy, moods or weight.    Hyperlipidemia: He is on atorvastatin 80mg . He admits to missing this about 2x/week (weekends).   He tries to follow a low  cholesterol diet (red meat 2/week, doesn't eat many eggs). Last lipids per endo 01/2022: TC 230, TG 214, HDL 57, LDL 135 He hasn't run out of the prescription from 03/2021 for a year supply.  Vitamin D deficiency:  Levels have been low when only taking Duterra vitamins.  He has been treated with Rx courses in the past, and encouraged to take additional D3 daily, but hasn't been. Only recently restarted Duterra vitamins, and isn't taking any additional D3.  Immunization History  Administered Date(s) Administered   Influenza, Seasonal, Injecte, Preservative Fre 01/06/2023   Influenza,inj,Quad PF,6+ Mos 01/24/2013, 02/28/2014, 01/16/2016, 01/21/2017, 01/05/2018, 01/11/2019, 01/31/2020   Influenza-Unspecified 02/17/2021   PFIZER(Purple Top)SARS-COV-2 Vaccination 01/31/2020, 02/21/2020   Pneumococcal Polysaccharide-23 02/25/2017   Tdap 07/18/2004, 07/19/2014   He declines COVID vaccines Last colonoscopy: never Last PSA: never Dentist: twice yearly Ophtho: yearly, just notified that he is due Exercise:  walks 2x/week x 30 minutes.  Heavy lifting at work. Walks a lot on the jobs (especially at [employer])   PMH, PSH, SH and FH were reviewed and updated  Outpatient Encounter Medications as of 01/06/2023  Medication Sig Note   Continuous Blood Gluc Transmit (DEXCOM G6 TRANSMITTER) MISC See admin instructions.    insulin aspart (NOVOLOG) 100 UNIT/ML injection Inject into the skin See admin instructions. Via Omnipod insulin pump, continuously    Insulin Syringe-Needle U-100 (INSULIN SYRINGE .5CC/28G) 28G X 1/2" 0.5 ML MISC 1 injection daily    levothyroxine (SYNTHROID) 300 MCG tablet Take 300 mcg by mouth  daily. 01/06/2023: Misses on weekends   liothyronine (CYTOMEL) 5 MCG tablet Take 10 mcg by mouth daily.    NON FORMULARY dOTerra Lifelong Vitality 3-pack: Take 3 tablets from each of the three bottles once a day at bedtime    pantoprazole (PROTONIX) 40 MG tablet Take 1 tablet (40 mg total) by mouth  daily.    [DISCONTINUED] atorvastatin (LIPITOR) 80 MG tablet Take 1 tablet (80 mg total) by mouth daily. 01/06/2023: Forgets on the weekends   atorvastatin (LIPITOR) 80 MG tablet Take 1 tablet (80 mg total) by mouth daily.    tadalafil (CIALIS) 5 MG tablet Take 1 tablet (5 mg total) by mouth daily. (Patient not taking: Reported on 01/03/2023) 01/06/2023: As needed   No facility-administered encounter medications on file as of 01/06/2023.   Allergies  Allergen Reactions   Nsaids Other (See Comments)    HAS ULCERS AND CAN TOLERATE ONLY TYLENOL    ROS: The patient denies anorexia, fever, headaches, vision loss, decreased hearing, ear pain, hoarseness, chest pain, palpitations, dizziness, syncope, dyspnea on exertion, swelling, nausea, diarrhea, constipation, abdominal pain, bloating, melena, hematochezia, indigestion/heartburn, hematuria, incontinence, erectile dysfunction, nocturia, weakened urine stream, dysuria, genital lesions, numbness, tingling, weakness, tremor, suspicious skin lesions, depression, anxiety, abnormal bleeding/bruising. Mild dry cough, per HPI Rash/dryness at his knuckles on his hands comes and goes No further vertigo. Denies blurred vision, numbness/tingling, polydipsia, or polyuria. Vomiting per HPI    PHYSICAL EXAM:  BP 112/70   Pulse 80   Ht 5' 8.5" (1.74 m)   Wt 217 lb 3.2 oz (98.5 kg)   BMI 32.54 kg/m   Wt Readings from Last 3 Encounters:  01/06/23 217 lb 3.2 oz (98.5 kg)  01/03/23 216 lb 3.2 oz (98.1 kg)  05/04/22 213 lb 3.2 oz (96.7 kg)   General Appearance:    Alert, cooperative, no distress, appears stated age. Rare dry cough noted during visit.  He is speaking comfortably, in no distress  Head:    Normocephalic, without obvious abnormality, atraumatic  Eyes:    PERRL, conjunctiva/corneas clear, EOM's intact, fundi benign  Ears:    Normal TM's and external ear canals.  Scarring of TM's noted bilaterally  Nose:   Mildly edematous mucosa with slight  clear mucus on the right. No purulence or sinus tenderness  Throat:   Normal mucosa, no erythema or lesions  Neck:   Supple, no lymphadenopathy;  thyroid:  no enlargement/ tenderness/nodules; no carotid bruit or JVD  Back:    Spine nontender, no curvature, ROM normal, no CVA tenderness  Lungs:     Clear to auscultation bilaterally without wheezes, rales or ronchi; respirations unlabored  Chest Wall:    No tenderness or deformity   Heart:    Regular rate and rhythm, S1 and S2 normal, no murmur, rub or gallop  Breast Exam:    No chest wall tenderness, masses or gynecomastia  Abdomen:     Soft, non-tender, nondistended, normoactive bowel sounds, no masses, no hepatosplenomegaly  Genitalia:    Normal male external genitalia without lesions.  Testicles without masses.  No inguinal hernias.  Rectal:    Normal sphincter tone, no nodules or masses. Prostate is not enlarged, smooth. Heme negative stool  Extremities:   No clubbing, cyanosis or edema.   Pulses:   2+ and symmetric all extremities  Skin:   Skin color, texture, turgor normal, no rashes or lesions. WHSS midline upper chest.  Mild dry/thickened skin on the knuckles (dorsum of MCP's) on R hand. 2  linear abrasions with areas of scab at R upper lateral chest, near axilla, with some abrasions to R hand. No soft tissue swelling, crusting.  Lymph nodes:   Cervical, supraclavicular and inguinal nodes normal  Neurologic:   Normal strength, sensation and gait; reflexes 2+ and symmetric throughout                                Psych:  Normal mood, affect, hygiene and grooming    ASSESSMENT/PLAN:  Annual physical exam - Plan: Lipid panel, CMP14+EGFR, CBC with Differential/Platelet, VITAMIN D 25 Hydroxy (Vit-D Deficiency, Fractures), POCT Urinalysis DIP (Proadvantage Device)  Gastroesophageal reflux disease, unspecified whether esophagitis present - continue pantoprazole; no vomiting since he restarted.  PND may contribute to gagging, discussed  antihistamine, mucinex prn  Uncontrolled type 1 diabetes mellitus with hyperglycemia (HCC) - Encouraged compliance with diet, boluses, meds. Encouraged eliminating regular sodas from his diet, drinking more water/seltzer. Cont f/u w/endo - Plan: CMP14+EGFR  Hypothyroidism, unspecified type - asymptomatic, poorly controlled due to noncompliance. Discussed measures to help with compliance. Monitored by endo  Vitamin D deficiency - suspect will be low, still not taking vitamins with D. May need add'l rx, vs starting daily D3, per labs. Discussed need for longterm supplementation daily - Plan: VITAMIN D 25 Hydroxy (Vit-D Deficiency, Fractures)  Mixed hyperlipidemia - LDL above goal on last check, compliance and high TSH contributing factors. Reviewed low cholesterol diet, continue atorvastatin - Plan: Lipid panel, atorvastatin (LIPITOR) 80 MG tablet  Need for influenza vaccination - Plan: Flu vaccine trivalent PF, 6mos and older(Flulaval,Afluria,Fluarix,Fluzone)  Need for pneumococcal vaccine - Plan: Pneumococcal polysaccharide vaccine 23-valent greater than or equal to 2yo subcutaneous/IM   Reminded to schedule yearly diabetic eye exam. Discussed potential for zetia vs trial of changing to Crestor if LDL remains >100 (not necessarily now, but in future, when TSH better; can give more time to make this change).  Recommended at least 30 minutes of aerobic activity at least 5 days/week, weight bearing exercise at least 2x/week; proper sunscreen use reviewed; healthy diet and alcohol recommendations (less than or equal to 2 drinks/day) reviewed; regular seatbelt use; changing batteries in smoke detectors. Immunization recommendations discussed--continue flu shots yearly, given today, along with pneumovax booster. Bivalent COVID booster was declined, strongly encouraged. Colonoscopy age 58.   F/u 1 year for CPE, sooner prn

## 2023-01-06 NOTE — Patient Instructions (Addendum)
  HEALTH MAINTENANCE RECOMMENDATIONS:  It is recommended that you get at least 30 minutes of aerobic exercise at least 5 days/week (for weight loss, you may need as much as 60-90 minutes). This can be any activity that gets your heart rate up. This can be divided in 10-15 minute intervals if needed, but try and build up your endurance at least once a week.  Weight bearing exercise is also recommended twice weekly.  Eat a healthy diet with lots of vegetables, fruits and fiber.  "Colorful" foods have a lot of vitamins (ie green vegetables, tomatoes, red peppers, etc).  Limit sweet tea, regular sodas and alcoholic beverages, all of which has a lot of calories and sugar.  Up to 2 alcoholic drinks daily may be beneficial for men (unless trying to lose weight, watch sugars).  Drink a lot of water.  Sunscreen of at least SPF 30 should be used on all sun-exposed parts of the skin when outside between the hours of 10 am and 4 pm (not just when at beach or pool, but even with exercise, golf, tennis, and yard work!)  Use a sunscreen that says "broad spectrum" so it covers both UVA and UVB rays, and make sure to reapply every 1-2 hours.  Remember to change the batteries in your smoke detectors when changing your clock times in the spring and fall.  Carbon monoxide detectors are recommended for your home.  Use your seat belt every time you are in a car, and please drive safely and not be distracted with cell phones and texting while driving.   PLEASE try and eliminate drinking soda. Try water or seltzers. I suspect your vitamin D will be low again, and I will replace it. BUT--you need to start taking a daily vitamin D3 1000 IU every day(or get the 2000 IU if you miss days. Don't take more than 5000 IU at a time (per day), but you can double up if you miss doses. Please buy the D3 now, so that you have it on hand to start after either your labs are back, or after you finish the prescription (if one is  needed).  Use antibacterial ointment (ie bacitracin) to the scabbed areas by your R armpit/upper chest.  If you notice increasing redness, swelling, pain or fever, this is a sign it could be getting infected and you need to be re-evaluated.  sli

## 2023-01-07 LAB — CBC WITH DIFFERENTIAL/PLATELET
Basophils Absolute: 0 10*3/uL (ref 0.0–0.2)
Basos: 0 %
EOS (ABSOLUTE): 0.2 10*3/uL (ref 0.0–0.4)
Eos: 3 %
Hematocrit: 43.7 % (ref 37.5–51.0)
Hemoglobin: 14.8 g/dL (ref 13.0–17.7)
Immature Grans (Abs): 0 10*3/uL (ref 0.0–0.1)
Immature Granulocytes: 1 %
Lymphocytes Absolute: 2.1 10*3/uL (ref 0.7–3.1)
Lymphs: 29 %
MCH: 30.6 pg (ref 26.6–33.0)
MCHC: 33.9 g/dL (ref 31.5–35.7)
MCV: 91 fL (ref 79–97)
Monocytes Absolute: 0.6 10*3/uL (ref 0.1–0.9)
Monocytes: 8 %
Neutrophils Absolute: 4.3 10*3/uL (ref 1.4–7.0)
Neutrophils: 59 %
Platelets: 310 10*3/uL (ref 150–450)
RBC: 4.83 x10E6/uL (ref 4.14–5.80)
RDW: 12.1 % (ref 11.6–15.4)
WBC: 7.2 10*3/uL (ref 3.4–10.8)

## 2023-01-07 LAB — CMP14+EGFR
ALT: 18 IU/L (ref 0–44)
AST: 18 IU/L (ref 0–40)
Albumin: 4.3 g/dL (ref 4.1–5.1)
Alkaline Phosphatase: 86 IU/L (ref 44–121)
BUN/Creatinine Ratio: 8 — ABNORMAL LOW (ref 9–20)
BUN: 9 mg/dL (ref 6–24)
Bilirubin Total: 0.6 mg/dL (ref 0.0–1.2)
CO2: 24 mmol/L (ref 20–29)
Calcium: 9.5 mg/dL (ref 8.7–10.2)
Chloride: 100 mmol/L (ref 96–106)
Creatinine, Ser: 1.08 mg/dL (ref 0.76–1.27)
Globulin, Total: 2.5 g/dL (ref 1.5–4.5)
Glucose: 139 mg/dL — ABNORMAL HIGH (ref 70–99)
Potassium: 4 mmol/L (ref 3.5–5.2)
Sodium: 139 mmol/L (ref 134–144)
Total Protein: 6.8 g/dL (ref 6.0–8.5)
eGFR: 87 mL/min/{1.73_m2} (ref >59)

## 2023-01-07 LAB — LIPID PANEL
Chol/HDL Ratio: 3.2 ratio (ref 0.0–5.0)
Cholesterol, Total: 160 mg/dL (ref 100–199)
HDL: 50 mg/dL (ref >39)
LDL Chol Calc (NIH): 94 mg/dL (ref 0–99)
Triglycerides: 85 mg/dL (ref 0–149)
VLDL Cholesterol Cal: 16 mg/dL (ref 5–40)

## 2023-01-07 LAB — VITAMIN D 25 HYDROXY (VIT D DEFICIENCY, FRACTURES): Vit D, 25-Hydroxy: 27.8 ng/mL — ABNORMAL LOW (ref 30.0–100.0)

## 2023-01-25 ENCOUNTER — Other Ambulatory Visit: Payer: Self-pay | Admitting: Family Medicine

## 2023-01-25 DIAGNOSIS — N528 Other male erectile dysfunction: Secondary | ICD-10-CM

## 2023-01-25 NOTE — Telephone Encounter (Signed)
Confirmed with patient and he did request this refill.

## 2023-02-02 ENCOUNTER — Encounter: Payer: BC Managed Care – PPO | Admitting: Family Medicine

## 2023-03-12 ENCOUNTER — Encounter: Payer: Self-pay | Admitting: Family Medicine

## 2023-04-02 ENCOUNTER — Other Ambulatory Visit: Payer: Self-pay | Admitting: Family Medicine

## 2023-04-02 DIAGNOSIS — K219 Gastro-esophageal reflux disease without esophagitis: Secondary | ICD-10-CM

## 2023-04-14 DIAGNOSIS — E063 Autoimmune thyroiditis: Secondary | ICD-10-CM | POA: Diagnosis not present

## 2023-04-14 DIAGNOSIS — E039 Hypothyroidism, unspecified: Secondary | ICD-10-CM | POA: Diagnosis not present

## 2023-04-14 DIAGNOSIS — E1065 Type 1 diabetes mellitus with hyperglycemia: Secondary | ICD-10-CM | POA: Diagnosis not present

## 2023-04-14 DIAGNOSIS — Z794 Long term (current) use of insulin: Secondary | ICD-10-CM | POA: Diagnosis not present

## 2023-04-14 LAB — HEMOGLOBIN A1C: Hemoglobin A1C: 10.6

## 2023-04-14 LAB — TSH: TSH: 62.76 — AB (ref 0.41–5.90)

## 2023-04-18 ENCOUNTER — Encounter: Payer: Self-pay | Admitting: *Deleted

## 2023-05-30 ENCOUNTER — Encounter: Payer: Self-pay | Admitting: Family Medicine

## 2023-05-31 ENCOUNTER — Other Ambulatory Visit: Payer: Self-pay | Admitting: *Deleted

## 2023-05-31 DIAGNOSIS — N528 Other male erectile dysfunction: Secondary | ICD-10-CM

## 2023-06-29 DIAGNOSIS — E039 Hypothyroidism, unspecified: Secondary | ICD-10-CM | POA: Diagnosis not present

## 2023-07-01 ENCOUNTER — Telehealth: Payer: Self-pay | Admitting: *Deleted

## 2023-07-01 NOTE — Telephone Encounter (Signed)
..  Patient was identified as falling into the [place] Measure - Diabetes.   Patient was: Appointment scheduled for lab or office visit for A1c.  Patient is a Type 1 diabetic and sees Dr. Sharl Ma. Not due for A1C until 07/13/23.

## 2023-07-04 DIAGNOSIS — N529 Male erectile dysfunction, unspecified: Secondary | ICD-10-CM | POA: Diagnosis not present

## 2023-07-04 DIAGNOSIS — R6882 Decreased libido: Secondary | ICD-10-CM | POA: Diagnosis not present

## 2023-07-05 ENCOUNTER — Encounter: Payer: Self-pay | Admitting: Family Medicine

## 2023-08-09 DIAGNOSIS — E063 Autoimmune thyroiditis: Secondary | ICD-10-CM | POA: Diagnosis not present

## 2023-08-09 DIAGNOSIS — Z794 Long term (current) use of insulin: Secondary | ICD-10-CM | POA: Diagnosis not present

## 2023-08-09 DIAGNOSIS — E039 Hypothyroidism, unspecified: Secondary | ICD-10-CM | POA: Diagnosis not present

## 2023-08-09 DIAGNOSIS — E1065 Type 1 diabetes mellitus with hyperglycemia: Secondary | ICD-10-CM | POA: Diagnosis not present

## 2023-08-09 LAB — HEMOGLOBIN A1C: Hemoglobin A1C: 9.9

## 2023-08-09 LAB — PROTEIN / CREATININE RATIO, URINE: Creatinine, Urine: 55

## 2023-08-09 LAB — MICROALBUMIN, URINE: Microalb, Ur: <0.7

## 2023-08-09 LAB — TSH: TSH: 11.88 — AB (ref 0.41–5.90)

## 2023-08-09 LAB — MICROALBUMIN / CREATININE URINE RATIO: Microalb Creat Ratio: <12.7

## 2023-09-29 DIAGNOSIS — E1065 Type 1 diabetes mellitus with hyperglycemia: Secondary | ICD-10-CM | POA: Diagnosis not present

## 2023-09-29 DIAGNOSIS — Z794 Long term (current) use of insulin: Secondary | ICD-10-CM | POA: Diagnosis not present

## 2023-09-29 DIAGNOSIS — E039 Hypothyroidism, unspecified: Secondary | ICD-10-CM | POA: Diagnosis not present

## 2023-09-29 DIAGNOSIS — E063 Autoimmune thyroiditis: Secondary | ICD-10-CM | POA: Diagnosis not present

## 2023-10-03 ENCOUNTER — Encounter: Payer: Self-pay | Admitting: Dietician

## 2023-10-03 ENCOUNTER — Encounter: Attending: Internal Medicine | Admitting: Dietician

## 2023-10-03 VITALS — Ht 67.0 in | Wt 208.0 lb

## 2023-10-03 DIAGNOSIS — E1065 Type 1 diabetes mellitus with hyperglycemia: Secondary | ICD-10-CM | POA: Diagnosis not present

## 2023-10-03 NOTE — Progress Notes (Signed)
 Diabetes Self-Management Education  Visit Type: First/Initial  Appt. Start Time: 1615 Appt. End Time: 1730  10/03/2023  Mr. Alejandro Griffin, identified by name and date of birth, is a 43 y.o. male with a diagnosis of Diabetes: Type 1.   Patient is here today alone.  He was last seen by a diabetes educator in 2018.  He would like to learn more about carbohydrate counting. Frequently forgets to bolus. Drinks regular drinks as he does not tolerate artificial or inert sweeteners but is trying to drink more water. He forgets to check his CGM.  Generally only looks at it in the am.   He turned his Dexcom high alarm in the office today. He is carbohydrate counting at times but does not enter carbs for sweetened drinks.   Discussed the custom foods feature on the Omnipod pump as a simple way to bolus. He is on the T1D Support Group list but jobs interfere with his ability to attend.  Referral:  Type 1 Diabetes - Dr. Kathyanne Parkers.  Type 1 Diabetes (age 43), partial rotator cuff tear (increased pain), Hashimoto's Thyroiditis, GERD, HLD, Sarcoidosis, ED Labs noted:  A1C 9.9% 08/09/2023 decreased from 10.6% 04/14/2023, Vitamin 27 on 01/06/2023, IgA was 4  07/22/2020(high) - MD recommended patient follow up with a gastroenterologist to check for possible celiac disease, eGFR 84 on 12/21/2022, TSH 11.88 on 08/09/2023 Medications:  Novolog via pump Pump:  Omnipod 5 CGM:  Dexcom G6 - patient does not have his CGM account set up so unable to see data at this time.  Reports fasting sensor glucose of 150 this am and 312 in office now.  Patient has not eaten today (just drank a regular Mt. Dew this am).  States that he does not feel well when his blood glucose is normal.  67 208 lbs 10/03/2023 - overall weight is stable per patient.  Patient lives with his wife and son.  They share shopping and cooking.   Patient works as a Administrator (first shift).  Part time at [employer]. Does not tolerate artificial  or inert sweeteners (dizzy/drunk symptoms)  ASSESSMENT  Height 5' 7 (1.702 m), weight 208 lb (94.3 kg). Body mass index is 32.58 kg/m.   Diabetes Self-Management Education - 10/03/23 1624       Visit Information   Visit Type First/Initial      Initial Visit   Diabetes Type Type 1    Date Diagnosed 2013    Are you currently following a meal plan? Yes    What type of meal plan do you follow? diabetes    Are you taking your medications as prescribed? Yes      Health Coping   How would you rate your overall health? Excellent      Psychosocial Assessment   Patient Belief/Attitude about Diabetes Denial    What is the hardest part about your diabetes right now, causing you the most concern, or is the most worrisome to you about your diabetes?   Making healty food and beverage choices;Taking/obtaining medications   remembering to bolus   Self-care barriers Other (comment)   time/energy   Self-management support Doctor's office    Other persons present Patient    Patient Concerns Nutrition/Meal planning;Glycemic Control    Special Needs None    Preferred Learning Style No preference indicated    Learning Readiness Ready    How often do you need to have someone help you when you read instructions, pamphlets, or other written materials from  your doctor or pharmacy? 1 - Never    What is the last grade level you completed in school? 2 years college      Pre-Education Assessment   Patient understands the diabetes disease and treatment process. Needs Review    Patient understands incorporating nutritional management into lifestyle. Needs Review    Patient undertands incorporating physical activity into lifestyle. Needs Review    Patient understands using medications safely. Needs Review    Patient understands monitoring blood glucose, interpreting and using results Needs Review    Patient understands prevention, detection, and treatment of acute complications. Needs Review    Patient  understands prevention, detection, and treatment of chronic complications. Needs Review    Patient understands how to develop strategies to address psychosocial issues. Needs Review    Patient understands how to develop strategies to promote health/change behavior. Needs Review      Complications   Last HgB A1C per patient/outside source 9.9 %   08/09/2023   How often do you check your blood sugar? > 4 times/day    Fasting Blood glucose range (mg/dL) 865-784    Postprandial Blood glucose range (mg/dL) >696    Number of hypoglycemic episodes per month 6    Number of hyperglycemic episodes ( >200mg /dL): Daily    Can you tell when your blood sugar is high? No    Have you had a dilated eye exam in the past 12 months? Yes    Have you had a dental exam in the past 12 months? Yes    Are you checking your feet? Yes    How many days per week are you checking your feet? 7      Dietary Intake   Breakfast Mt. Dew (regular)    Lunch none today OR [employer]'s (2 cheeseburgers, fries, drink or Cookout (grilled chicken sandwich and onion rings, sweet tea )    Snack (afternoon) chips when he doesn't eat lunch    Dinner pizza rolls, mozerella sticks, chicken nuggets    Snack (evening) none    Beverage(s) water, regular soda, sweet tea, lemonade      Activity / Exercise   Activity / Exercise Type Light (walking / raking leaves)    How many days per week do you exercise? 5    How many minutes per day do you exercise? 30    Total minutes per week of exercise 150      Patient Education   Previous Diabetes Education Yes (please comment)   2023   Disease Pathophysiology Explored patient's options for treatment of their diabetes    Healthy Eating Carbohydrate counting;Information on hints to eating out and maintain blood glucose control.;Meal options for control of blood glucose level and chronic complications.    Being Active Role of exercise on diabetes management, blood pressure control and cardiac  health.;Identified with patient nutritional and/or medication changes necessary with exercise.    Medications Taught/reviewed insulin /injectables, injection, site rotation, insulin /injectables storage and needle disposal.;Reviewed medication adjustment guidelines for hyperglycemia and sick days.;Reviewed patients medication for diabetes, action, purpose, timing of dose and side effects.    Monitoring Identified appropriate SMBG and/or A1C goals.;Daily foot exams;Yearly dilated eye exam    Chronic complications Relationship between chronic complications and blood glucose control    Diabetes Stress and Support Identified and addressed patients feelings and concerns about diabetes;Worked with patient to identify barriers to care and solutions    Lifestyle and Health Coping Lifestyle issues that need to be addressed for better diabetes  care      Individualized Goals (developed by patient)   Nutrition Carb counting    Physical Activity Exercise 5-7 days per week    Medications take my medication as prescribed    Monitoring  Consistenly use CGM;Not Applicable    Problem Solving Eating Pattern;Medication consistency    Reducing Risk examine blood glucose patterns;do foot checks daily;check ketones if blood glucose over 240mg /dL;treat hypoglycemia with 15 grams of carbs if blood glucose less than 70mg /dL    Health Coping Ask for help with psychological, social, or emotional issues      Post-Education Assessment   Patient understands the diabetes disease and treatment process. Demonstrates understanding / competency    Patient understands incorporating nutritional management into lifestyle. Comprehends key points    Patient undertands incorporating physical activity into lifestyle. Demonstrates understanding / competency    Patient understands using medications safely. Comphrehends key points    Patient understands monitoring blood glucose, interpreting and using results Demonstrates understanding /  competency    Patient understands prevention, detection, and treatment of acute complications. Demonstrates understanding / competency    Patient understands prevention, detection, and treatment of chronic complications. Demonstrates understanding / competency    Patient understands how to develop strategies to address psychosocial issues. Needs Review    Patient understands how to develop strategies to promote health/change behavior. Needs Review      Outcomes   Expected Outcomes Demonstrated interest in learning but significant barriers to change    Future DMSE PRN    Program Status Not Completed          Individualized Plan for Diabetes Self-Management Training:   Learning Objective:  Patient will have a greater understanding of diabetes self-management. Patient education plan is to attend individual and/or group sessions per assessed needs and concerns.   Plan:   Patient Instructions  Remember to bolus before all meals. Rethink what you drink - drink more water - hydrate Look at your CGM   Before you drive!  Before you touch an electrical panel  First thing in the morning  Before and after you eat  Add Breakfast  Jimmie Dean Delight Breakfast sandwich  PB toast and banana  [place] yogurt, fresh fruit  Whole grain waffle (nutrigrain), fresh fruit, [place] sausage or egg  Eating out -  Lower fat?  Sub salad for fries?  1 sandwich rather than 2?  Packed lunch and snack?  Carb count  Add custom foods and meals into your Omnipod PDM  Calorie King app  Meal plan card    Expected Outcomes:  Demonstrated interest in learning but significant barriers to change  Education material provided: Meal plan card and Diabetes Resources  If problems or questions, patient to contact team via:  Phone  Future DSME appointment: PRN

## 2023-10-03 NOTE — Patient Instructions (Addendum)
 Remember to bolus before all meals. Rethink what you drink - drink more water - hydrate Look at your CGM   Before you drive!  Before you touch an electrical panel  First thing in the morning  Before and after you eat  Add Breakfast  Jimmie Dean Delight Breakfast sandwich  PB toast and banana  [place] yogurt, fresh fruit  Whole grain waffle (nutrigrain), fresh fruit, [place] sausage or egg  Eating out -  Lower fat?  Sub salad for fries?  1 sandwich rather than 2?  Packed lunch and snack?  Carb count  Add custom foods and meals into your Omnipod PDM  Calorie [employer] plan card

## 2023-11-06 ENCOUNTER — Other Ambulatory Visit: Payer: Self-pay | Admitting: Family Medicine

## 2023-11-06 DIAGNOSIS — N528 Other male erectile dysfunction: Secondary | ICD-10-CM

## 2023-11-07 NOTE — Telephone Encounter (Signed)
 Is this okay to refill?

## 2023-11-10 DIAGNOSIS — E063 Autoimmune thyroiditis: Secondary | ICD-10-CM | POA: Diagnosis not present

## 2023-11-10 DIAGNOSIS — E1065 Type 1 diabetes mellitus with hyperglycemia: Secondary | ICD-10-CM | POA: Diagnosis not present

## 2023-11-10 DIAGNOSIS — Z794 Long term (current) use of insulin: Secondary | ICD-10-CM | POA: Diagnosis not present

## 2023-11-10 DIAGNOSIS — E039 Hypothyroidism, unspecified: Secondary | ICD-10-CM | POA: Diagnosis not present

## 2023-11-11 LAB — HM DIABETES FOOT EXAM: HM Diabetic Foot Exam: NORMAL

## 2023-11-15 ENCOUNTER — Encounter: Payer: Self-pay | Admitting: *Deleted

## 2023-11-29 DIAGNOSIS — E1065 Type 1 diabetes mellitus with hyperglycemia: Secondary | ICD-10-CM | POA: Diagnosis not present

## 2023-11-29 DIAGNOSIS — E039 Hypothyroidism, unspecified: Secondary | ICD-10-CM | POA: Diagnosis not present

## 2023-12-01 LAB — HEMOGLOBIN A1C: Hemoglobin A1C: 8.9

## 2023-12-01 LAB — COMPREHENSIVE METABOLIC PANEL WITH GFR: eGFR: 85

## 2023-12-01 LAB — MICROALBUMIN / CREATININE URINE RATIO: Microalb Creat Ratio: <12.7

## 2023-12-01 LAB — MICROALBUMIN, URINE: Microalb, Ur: <0.7

## 2023-12-01 LAB — PROTEIN / CREATININE RATIO, URINE: Creatinine, Urine: 55

## 2023-12-26 ENCOUNTER — Encounter: Payer: Self-pay | Admitting: Family Medicine

## 2023-12-26 ENCOUNTER — Ambulatory Visit
Admission: RE | Admit: 2023-12-26 | Discharge: 2023-12-26 | Disposition: A | Discharge status: Home / Self Care | Source: Ambulatory Visit | Attending: Family Medicine | Admitting: Family Medicine

## 2023-12-26 ENCOUNTER — Ambulatory Visit (INDEPENDENT_AMBULATORY_CARE_PROVIDER_SITE_OTHER): Admitting: Family Medicine

## 2023-12-26 ENCOUNTER — Ambulatory Visit: Payer: Self-pay | Admitting: Family Medicine

## 2023-12-26 VITALS — BP 130/80 | HR 100 | Temp 98.7°F | Ht 68.5 in | Wt 217.6 lb

## 2023-12-26 DIAGNOSIS — Z862 Personal history of diseases of the blood and blood-forming organs and certain disorders involving the immune mechanism: Secondary | ICD-10-CM | POA: Diagnosis not present

## 2023-12-26 DIAGNOSIS — R059 Cough, unspecified: Secondary | ICD-10-CM | POA: Diagnosis not present

## 2023-12-26 DIAGNOSIS — R058 Other specified cough: Secondary | ICD-10-CM

## 2023-12-26 MED ORDER — METHOCARBAMOL 500 MG PO TABS: 500.0000 mg | ORAL_TABLET | Freq: Three times a day (TID) | ORAL | 0 refills | Status: DC | PRN

## 2023-12-26 MED ORDER — BENZONATATE 200 MG PO CAPS: 200.0000 mg | ORAL_CAPSULE | Freq: Two times a day (BID) | ORAL | 0 refills | Status: AC | PRN

## 2023-12-26 NOTE — Patient Instructions (Signed)
 NONPRODUCTIVE COUGH, POSSIBLE SARCOIDOSIS FLARE: You have had a severe nonproductive cough for two weeks, which might be a flare-up of your sarcoidosis. -We will order a chest x-ray immediately to check for a sarcoidosis flare or other lung issues. -We will wait for the chest x-ray results before deciding on prescribing prednisone .  TYPE 1 DIABETES MELLITUS: Your blood sugar levels have been high. The prednisone  contributes. -Make sure to refill and use your insulin  pump and use your insulin  in the meantime -Monitor your blood sugar levels closely, especially if you start taking prednisone  again.  Go to [facility] [address] for chest x-ray now.

## 2023-12-26 NOTE — Progress Notes (Signed)
 Chief Complaint  Patient presents with   Cough    Cough x 2 weeks that worsened this past weekend. Thinks he is having sarcoidosis flare. Took 2 prednisone  tablets that he had at the house one yesterday and one today-20mg .    Discussed the use of AI scribe software for clinical note transcription with the patient, who gave verbal consent to proceed.   Alejandro Griffin is a 43 year old male with sarcoidosis who presents with a nonproductive cough.  He has experienced a severe nonproductive cough for two weeks, leading to occasional nausea and vomiting during intense coughing spells. Discomfort is more pronounced on the left side of his chest, described as lung pain during coughing. There is no fever, chills, sneezing, runny nose, or shortness of breath except during coughing spells.  He self-administered 20 mg of prednisone  x 2 doses, which provided some relief last night but not today. He notes his sugar was 400 earlier today, partly from the prednisone , and also contributed by not using his insulin  pump after running out of supplies. He plans to get this rectified today. He reports last A1c was 8.9.  His last chest x-ray was in January 2024, at last visit with Dr. Jude (pulmonary).  He is worried about the possibility of sarcoidosis returning, as this feels similar. His sarcoidosis was diagnosed in 11/2017. He has been in remission.  He hasn't taken any OTC meds for cough bc nothing helped in the past. Took 20 mg prednisone  x 2d, helped last night, not today   PMH, PSH, SH reviewed  Outpatient Encounter Medications as of 12/26/2023  Medication Sig Note   atorvastatin  (LIPITOR) 80 MG tablet Take 1 tablet (80 mg total) by mouth daily.    Continuous Blood Gluc Transmit (DEXCOM G6 TRANSMITTER) MISC See admin instructions.    insulin  aspart (NOVOLOG) 100 UNIT/ML injection Inject into the skin See admin instructions. Via Omnipod insulin  pump, continuously    Insulin  Syringe-Needle U-100 (INSULIN   SYRINGE .5CC/28G) 28G X 1/2 0.5 ML MISC 1 injection daily    levothyroxine  (SYNTHROID ) 300 MCG tablet Take 300 mcg by mouth daily. 12/26/2023: 300mcg M-Sat and 600mcg Sunday   liothyronine (CYTOMEL) 5 MCG tablet Take 10 mcg by mouth daily.    NON FORMULARY dOTerra Lifelong Vitality 3-pack: Take 3 tablets from each of the three bottles once a day at bedtime    pantoprazole  (PROTONIX ) 40 MG tablet TAKE 1 TABLET BY MOUTH EVERY DAY    predniSONE  (DELTASONE ) 20 MG tablet Take 20 mg by mouth daily with breakfast. 12/26/2023: Took one yesterday and one this am   tadalafil  (CIALIS ) 5 MG tablet TAKE 1 TABLET (5 MG TOTAL) BY MOUTH DAILY.    No facility-administered encounter medications on file as of 12/26/2023.   Allergies  Allergen Reactions   Nsaids Other (See Comments)    HAS ULCERS AND CAN TOLERATE ONLY TYLENOL     ROS:  No f/c, URI symptoms, allergy symptoms, shortness of breath. Chest pain only with coughing spells. Denies other complaints.   PHYSICAL EXAM:  BP 130/80   Pulse 100   Temp 98.7 F (37.1 C) (Tympanic)   Ht 5' 8.5 (1.74 m)   Wt 217 lb 9.6 oz (98.7 kg)   SpO2 95%   BMI 32.60 kg/m   Wt Readings from Last 3 Encounters:  12/26/23 217 lb 9.6 oz (98.7 kg)  10/03/23 208 lb (94.3 kg)  01/06/23 217 lb 3.2 oz (98.5 kg)   Well-appearing, pleasant male, in no distress.  He did not cough at all during office visit HEENT: conjunctiva and sclera are clear, EOMI. TM's and EAC's normal. OP is clear. No nasal drainage or sinus tenderness Neck: no lymphadenopathy or mass Heart: mildly tachycardic, at 100. No murmur Lungs: clear bilaterally, no wheezes, rales, ronchi Extremities: no edema Psych: normal mood, affect, hygiene and grooming Neuro: alert and oriented, cranial nerves grossly intact, normal gait.   ASSESSMENT/PLAN:  Nonproductive cough - x2 weeks. normal physical exam. Check CXR to eval for recurrent sarcoidosis. Recommended f/u with his pulmonologist - Plan: DG Chest 2  View  History of sarcoidosis - Plan: DG Chest 2 View  Nonproductive cough--x 2 weeks, concerned about this being recurrence of sarcoidosis. Prednisone  x2d helped some, but not as much today. - chest x-ray to evaluate for sarcoidosis flare or other pulmonary issues. - Hold off on prescribing prednisone  until chest x-ray results are available.  Type 1 diabetes mellitus Elevated blood glucose levels likely related to recent prednisone  use and not currently using insulin  pump. -Encouraged to restart pump use today, and use insulin  to cover in the interim. - Monitor blood glucose levels closely.  ADDENDUM: normal CXR. Advised to f/u with pulm. I'm not prescribing prednisone  at this time. Needs further eval by pulm. Rx'd benzonatate , suggested Delsym BID prn as well.

## 2024-01-08 NOTE — Progress Notes (Unsigned)
 No chief complaint on file.   Alejandro Griffin is a 43 y.o. male who presents for a complete physical.    He was seen recently with 2 weeks of nonproductive cough, with concern for recurrent sarcoidosis. He was having some left-sided pain, no SOB. He has been in remission. Last visit Dr. Jude (pulmonary) was in 04/2022.  Sarcoidosis was diagnosed in 11/2017. CXR 9/8 was normal. He was prescribed benzonatate  and advised to take Delsym BID prn.  He scheduled appt with Dr. Jude for 11/6.  ***UPDATE   History of gastritis. He used to take pantoprazole  chronically.  He stopped it years ago and had done well without heartburn, reflux or indigestion, until the vomiting started as per recent visit. Pantoprazole  was restarted a few days ago. No gagging or vomiting the last couple of days. No change in dry cough noted yet.   Type 1 diabetes: under the care of Dr. Faythe. This hasn't been well controlled.  Last visit was 10/2023, with labs scheduled for August. No results received. Pt states last A1c was 8.9%. Sugars have been ***  Can't tolerate aspartame in diet sodas, caused vertigo. Still drinks regular soda.   Hypothyroidism: Managed by Dr. Faythe. TSH have been elevated in the past related to noncompliance. Last TSH was 11.88 in 07/2023. He previously reported having trouble remembering to take meds on weekends. *** Prescribed dose is 300 mcg Mon through Sat and 600 mcg on Sunday. He is currently taking ***  Denies changes to skin, hair, nails, bowels, energy, moods or weight.    Hyperlipidemia: He is on atorvastatin  80mg . He admits to missing this about 2x/week (weekends).  ***  He tries to follow a low cholesterol diet (red meat 2/week, doesn't eat many eggs). Due for recheck today.  Lab Results  Component Value Date   CHOL 160 01/06/2023   HDL 50 01/06/2023   LDLCALC 94 01/06/2023   TRIG 85 01/06/2023   CHOLHDL 3.2 01/06/2023    Vitamin D  deficiency:  Levels have been low when  only taking [place] vitamins.  He has been treated with Rx courses in the past, and encouraged to take additional D3 daily, but hasn't been.  He is currently taking ***  Component Ref Range & Units (hover) 1 yr ago 2 yr ago 3 yr ago 4 yr ago 6 yr ago 7 yr ago 9 yr ago  Vit D, 25-Hydroxy 27.8 Low  19.9 Low  CM 20.6 Low  CM 32.6 CM 27.7 Low  CM 27 Low  R, CM 20 Low  R, CM    Immunization History  Administered Date(s) Administered   Influenza Inj Mdck Quad With Preservative 02/26/2021   Influenza, Seasonal, Injecte, Preservative Fre 01/06/2023   Influenza,inj,Quad PF,6+ Mos 01/24/2013, 02/28/2014, 01/16/2016, 01/21/2017, 01/05/2018, 01/11/2019, 01/31/2020   Influenza-Unspecified 02/17/2021   PFIZER(Purple Top)SARS-COV-2 Vaccination 01/31/2020, 02/21/2020   Pneumococcal Polysaccharide-23 02/25/2017, 01/06/2023   Tdap 07/18/2004, 07/19/2014   He declines COVID vaccines Last colonoscopy: never Last PSA: never Dentist: twice yearly Ophtho: yearly, just notified that he is due Exercise:  walks 2x/week x 30 minutes.  Heavy lifting at work. Walks a lot on the jobs (especially at [employer])   PMH, PSH, SH and FH were reviewed and updated     ROS: The patient denies anorexia, fever, headaches, vision loss, decreased hearing, ear pain, hoarseness, chest pain, palpitations, dizziness, syncope, dyspnea on exertion, swelling, nausea, diarrhea, constipation, abdominal pain, bloating, melena, hematochezia, indigestion/heartburn, hematuria, incontinence, nocturia, weakened urine stream, dysuria, genital lesions,  numbness, tingling, weakness, tremor, suspicious skin lesions, depression, anxiety, abnormal bleeding/bruising.  Rash/dryness at his knuckles on his hands comes and goes Denies vertigo. Denies blurred vision, numbness/tingling, polydipsia, or polyuria. Dry cough  Reflux?   PHYSICAL EXAM:  There were no vitals taken for this visit.  Wt Readings from Last 3 Encounters:  12/26/23 217 lb  9.6 oz (98.7 kg)  10/03/23 208 lb (94.3 kg)  01/06/23 217 lb 3.2 oz (98.5 kg)   General Appearance:    Alert, cooperative, no distress, appears stated age. He is speaking comfortably, in no distress  Head:    Normocephalic, without obvious abnormality, atraumatic  Eyes:    PERRL, conjunctiva/corneas clear, EOM's intact, fundi benign  Ears:    Normal TM's and external ear canals.  Scarring of TM's noted bilaterally  Nose:   No drainage or sinus tenderness  Throat:   Normal mucosa, no erythema or lesions  Neck:   Supple, no lymphadenopathy;  thyroid :  no enlargement/ tenderness/nodules; no carotid bruit or JVD  Back:    Spine nontender, no curvature, ROM normal, no CVA tenderness  Lungs:     Clear to auscultation bilaterally without wheezes, rales or ronchi; respirations unlabored  Chest Wall:    No tenderness or deformity   Heart:    Regular rate and rhythm, S1 and S2 normal, no murmur, rub or gallop  Breast Exam:    No chest wall tenderness, masses or gynecomastia  Abdomen:     Soft, non-tender, nondistended, normoactive bowel sounds, no masses, no hepatosplenomegaly  Genitalia:    Normal male external genitalia without lesions.  Testicles without masses.  No inguinal hernias.  Rectal:    Normal sphincter tone, no nodules or masses. Prostate is not enlarged, smooth. Heme negative stool  Extremities:   No clubbing, cyanosis or edema.   Pulses:   2+ and symmetric all extremities  Skin:   Skin color, texture, turgor normal, no rashes or lesions. WHSS midline upper chest.   Mild dry/thickened skin on the knuckles (dorsum of MCP's) on R hand.   Lymph nodes:   Cervical, supraclavicular and inguinal nodes normal  Neurologic:   Normal strength, sensation and gait; reflexes 2+ and symmetric throughout                                Psych:  Normal mood, affect, hygiene and grooming    ***UPDATE--any cough? UPDATE SKIN--hands   NASAL EXAM  ASSESSMENT/PLAN:   When was last DM eye exam???  Scheduled??  Need (last we have is 2020?)    Recommended at least 30 minutes of aerobic activity at least 5 days/week, weight bearing exercise at least 2x/week; proper sunscreen use reviewed; healthy diet and alcohol  recommendations (less than or equal to 2 drinks/day) reviewed; regular seatbelt use; changing batteries in smoke detectors. Immunization recommendations discussed--continue flu shots yearly, given today, along with pneumovax booster. Bivalent COVID booster was declined, strongly encouraged. Colonoscopy age 64.   F/u 1 year for CPE, sooner prn

## 2024-01-08 NOTE — Patient Instructions (Incomplete)

## 2024-01-09 ENCOUNTER — Ambulatory Visit (INDEPENDENT_AMBULATORY_CARE_PROVIDER_SITE_OTHER): Payer: BC Managed Care – PPO | Admitting: Family Medicine

## 2024-01-09 ENCOUNTER — Encounter: Payer: Self-pay | Admitting: *Deleted

## 2024-01-09 ENCOUNTER — Encounter: Payer: Self-pay | Admitting: Family Medicine

## 2024-01-09 VITALS — BP 120/82 | HR 72 | Ht 68.5 in | Wt 217.0 lb

## 2024-01-09 DIAGNOSIS — E782 Mixed hyperlipidemia: Secondary | ICD-10-CM

## 2024-01-09 DIAGNOSIS — E1065 Type 1 diabetes mellitus with hyperglycemia: Secondary | ICD-10-CM

## 2024-01-09 DIAGNOSIS — K219 Gastro-esophageal reflux disease without esophagitis: Secondary | ICD-10-CM

## 2024-01-09 DIAGNOSIS — Z23 Encounter for immunization: Secondary | ICD-10-CM | POA: Diagnosis not present

## 2024-01-09 DIAGNOSIS — E559 Vitamin D deficiency, unspecified: Secondary | ICD-10-CM | POA: Diagnosis not present

## 2024-01-09 DIAGNOSIS — Z1211 Encounter for screening for malignant neoplasm of colon: Secondary | ICD-10-CM

## 2024-01-09 DIAGNOSIS — Z5181 Encounter for therapeutic drug level monitoring: Secondary | ICD-10-CM | POA: Diagnosis not present

## 2024-01-09 DIAGNOSIS — Z0000 Encounter for general adult medical examination without abnormal findings: Secondary | ICD-10-CM | POA: Diagnosis not present

## 2024-01-09 DIAGNOSIS — E039 Hypothyroidism, unspecified: Secondary | ICD-10-CM | POA: Diagnosis not present

## 2024-01-09 MED ORDER — ATORVASTATIN CALCIUM 80 MG PO TABS: 80.0000 mg | ORAL_TABLET | Freq: Every day | ORAL | 3 refills | Status: AC

## 2024-01-10 ENCOUNTER — Ambulatory Visit: Payer: Self-pay | Admitting: Family Medicine

## 2024-01-10 LAB — CBC WITH DIFFERENTIAL/PLATELET
Basophils Absolute: 0 x10E3/uL (ref 0.0–0.2)
Basos: 1 %
EOS (ABSOLUTE): 0.1 x10E3/uL (ref 0.0–0.4)
Eos: 1 %
Hematocrit: 47.4 % (ref 37.5–51.0)
Hemoglobin: 15.3 g/dL (ref 13.0–17.7)
Immature Grans (Abs): 0.1 x10E3/uL (ref 0.0–0.1)
Immature Granulocytes: 1 %
Lymphocytes Absolute: 2.4 x10E3/uL (ref 0.7–3.1)
Lymphs: 28 %
MCH: 29.7 pg (ref 26.6–33.0)
MCHC: 32.3 g/dL (ref 31.5–35.7)
MCV: 92 fL (ref 79–97)
Monocytes Absolute: 0.7 x10E3/uL (ref 0.1–0.9)
Monocytes: 8 %
Neutrophils Absolute: 5.3 x10E3/uL (ref 1.4–7.0)
Neutrophils: 61 %
Platelets: 390 x10E3/uL (ref 150–450)
RBC: 5.15 x10E6/uL (ref 4.14–5.80)
RDW: 13.3 % (ref 11.6–15.4)
WBC: 8.5 x10E3/uL (ref 3.4–10.8)

## 2024-01-10 LAB — VITAMIN D 25 HYDROXY (VIT D DEFICIENCY, FRACTURES): Vit D, 25-Hydroxy: 29.2 ng/mL — AB (ref 30.0–100.0)

## 2024-02-02 DIAGNOSIS — E039 Hypothyroidism, unspecified: Secondary | ICD-10-CM | POA: Diagnosis not present

## 2024-02-09 NOTE — Progress Notes (Signed)
 Alejandro Griffin                                          MRN: [id number]   02/09/2024   The [facility] Quality Team Specialist reviewed this patient medical record for the purposes of chart review for care gap closure. The following were reviewed: chart review for care gap closure-glycemic status assessment.    [facility] Quality Team

## 2024-02-23 ENCOUNTER — Ambulatory Visit (HOSPITAL_BASED_OUTPATIENT_CLINIC_OR_DEPARTMENT_OTHER): Admitting: Pulmonary Disease

## 2024-02-23 ENCOUNTER — Encounter (HOSPITAL_BASED_OUTPATIENT_CLINIC_OR_DEPARTMENT_OTHER): Payer: Self-pay | Admitting: Pulmonary Disease

## 2024-02-23 VITALS — BP 117/72 | HR 79 | Ht 68.0 in | Wt 214.0 lb

## 2024-02-23 DIAGNOSIS — D869 Sarcoidosis, unspecified: Secondary | ICD-10-CM

## 2024-02-23 DIAGNOSIS — Z794 Long term (current) use of insulin: Secondary | ICD-10-CM

## 2024-02-23 DIAGNOSIS — D861 Sarcoidosis of lymph nodes: Secondary | ICD-10-CM | POA: Diagnosis not present

## 2024-02-23 DIAGNOSIS — E109 Type 1 diabetes mellitus without complications: Secondary | ICD-10-CM

## 2024-02-23 DIAGNOSIS — R053 Chronic cough: Secondary | ICD-10-CM

## 2024-02-23 NOTE — Progress Notes (Signed)
 Subjective:    Patient ID: Alejandro Griffin, male    DOB: 11-07-1980, 43 y.o.   MRN: [id number]  44 yo type I diabetic, never smoker For follow-up of sarcoidosis -works as an personnel officer    Presented with mediastinal lymphadenopathy in 2018    01/13/17 underwent mediastinoscopy biopsy of 4R lymph node which showed noncaseating granulomas, AFB and fungal stains are negative He was treated with prednisone  for 4-5 months   Discussed the use of AI scribe software for clinical note transcription with the patient, who gave verbal consent to proceed.  History of Present Illness Alejandro Griffin is a 43 year old male with sarcoidosis and type 1 diabetes who presents with persistent cough and chest tightness.  Since September, he experiences increased coughing, with episodes severe enough to cause near blackouts and nocturnal awakenings. Intermittent chest tightness is present, described as a sensation of fluid in the lungs. A recent chest x-ray was clear.  He is concerned about a sarcoidosis flare-up. Prednisone  has previously provided relief, but he is cautious due to its impact on blood sugar levels. He has some remaining prednisone  tablets, 20 mg, from a prior prescription.  Blood sugars are higher than usual, attributed to poor dietary habits, including increased soda consumption. He uses an insulin  pump for diabetes management.  There are no symptoms of allergies such as runny nose or itchy eyes, and no shortness of breath during physical activity. Cough pills have been used sparingly with some benefit. Vitamin D  levels are low but not severely.      CXR 12/2023 clear Significant tests/ events reviewed   CT angiogram 09/2017 -resolution of infiltrates and mediastinal lymphadenopathy.  CT chest  12/14/16 - mediastinal and bilateral hilar lymphadenopathy and lymph nodes in the abdomen. There is an area of consolidation in the right lung base and other areas of groundglass opacification.    ACE 18   PFTS 02/2017 FEV1 93%, ratio 87, FVC 86%.  DLCO 98%.  PFTs 04/2022 >> FVC 76%, DLCO 96%    Review of Systems  neg for any significant sore throat, dysphagia, itching, sneezing, nasal congestion or excess/ purulent secretions, fever, chills, sweats, unintended wt loss, pleuritic or exertional cp, hempoptysis, orthopnea pnd or change in chronic leg swelling. Also denies presyncope, palpitations, heartburn, abdominal pain, nausea, vomiting, diarrhea or change in bowel or urinary habits, dysuria,hematuria, rash, arthralgias, visual complaints, headache, numbness weakness or ataxia.      Objective:   Physical Exam  Gen. Pleasant, well-nourished, in no distress ENT - no thrush, no pallor/icterus,no post nasal drip Neck: No JVD, no thyromegaly, no carotid bruits Lungs: no use of accessory muscles, no dullness to percussion, clear without rales or rhonchi  Cardiovascular: Rhythm regular, heart sounds  normal, no murmurs or gallops, no peripheral edema Musculoskeletal: No deformities, no cyanosis or clubbing        Assessment & Plan:   Assessment and Plan Assessment & Plan Sarcoidosis involving intrathoracic lymph nodes Intermittent cough and chest tightness since September. Chest x-ray clear. Previous sarcoidosis primarily in lymph nodes, not visible in lungs. Differential includes sarcoidosis flare versus other causes of cough. - Ordered CT chest with contrast to evaluate lymph nodes and lungs. - If CT scan is abnormal, will consider short course of prednisone  (20 mg for 1 week, 10 mg for 1 week, 5 mg for 1 week). - If CT scan is normal, will focus on symptomatic treatment and consider allergy vs GERD-related causes of cough.  Chronic cough Intermittent  dry cough, sometimes severe enough to cause near syncope. Cough occasionally disrupts sleep. Previous use of prednisone  provided some relief but not sustained. Cough pills provided minimal relief. - Ordered CT chest with  contrast to evaluate for underlying causes of cough. - If CT scan is abnormal, will consider short course of prednisone . - If CT scan is normal, will focus on symptomatic treatment and consider allergy-related causes of cough.  Type 1 diabetes mellitus Blood sugars slightly elevated, attributed to dietary habits and work-related stress. Insulin -dependent and uses an insulin  pump. Not a candidate for newer injectable medications like Ozempic due to type 1 diabetes. - Continue current insulin  regimen.

## 2024-02-23 NOTE — Patient Instructions (Signed)
  VISIT SUMMARY: You came in today because of a persistent cough and chest tightness. We discussed your concerns about a possible sarcoidosis flare-up and reviewed your recent chest x-ray results. We also talked about your type 1 diabetes management and your recent blood sugar levels.  YOUR PLAN: -SARCOIDOSIS INVOLVING INTRATHORACIC LYMPH NODES: Sarcoidosis is a condition where clusters of inflammatory cells form in different parts of your body, often in the lungs and lymph nodes. We will do a CT scan of your chest with contrast to get a better look at your lymph nodes and lungs. If the scan shows any issues, we may start you on a short course of prednisone . If the scan is normal, we will focus on treating your symptoms and consider if allergies might be causing your cough.  -CHRONIC COUGH: A chronic cough is a cough that lasts for a long time. We will use the CT scan of your chest to check for any underlying causes. Depending on the results, we may start you on prednisone  or focus on treating your symptoms and consider allergies as a possible cause.    INSTRUCTIONS: Please schedule your CT chest scan with contrast as soon as possible. Follow up with us  after the scan to discuss the results and next steps. Continue monitoring your blood sugar levels and try to maintain a healthy diet to help manage your diabetes.                      Contains text generated by [employer].                                 Contains text generated by [employer].

## 2024-02-29 ENCOUNTER — Encounter: Payer: Self-pay | Admitting: Family Medicine

## 2024-03-09 DIAGNOSIS — E063 Autoimmune thyroiditis: Secondary | ICD-10-CM | POA: Diagnosis not present

## 2024-03-09 DIAGNOSIS — Z794 Long term (current) use of insulin: Secondary | ICD-10-CM | POA: Diagnosis not present

## 2024-03-09 DIAGNOSIS — E1065 Type 1 diabetes mellitus with hyperglycemia: Secondary | ICD-10-CM | POA: Diagnosis not present

## 2024-03-09 DIAGNOSIS — E039 Hypothyroidism, unspecified: Secondary | ICD-10-CM | POA: Diagnosis not present

## 2024-03-09 LAB — HEMOGLOBIN A1C: Hemoglobin A1C: 9.2

## 2024-03-12 ENCOUNTER — Encounter: Payer: Self-pay | Admitting: *Deleted

## 2024-04-04 ENCOUNTER — Encounter (HOSPITAL_BASED_OUTPATIENT_CLINIC_OR_DEPARTMENT_OTHER): Payer: Self-pay | Admitting: Pulmonary Disease

## 2024-04-05 NOTE — Telephone Encounter (Signed)
 Can you please schedule?

## 2024-04-18 ENCOUNTER — Ambulatory Visit (HOSPITAL_BASED_OUTPATIENT_CLINIC_OR_DEPARTMENT_OTHER): Admission: RE | Admit: 2024-04-18 | Source: Ambulatory Visit

## 2024-05-03 ENCOUNTER — Encounter: Admitting: Dietician

## 2024-05-28 ENCOUNTER — Encounter: Admitting: Dietician

## 2025-01-10 ENCOUNTER — Encounter: Payer: Self-pay | Admitting: Family Medicine
